# Patient Record
Sex: Male | Born: 1955 | Race: Black or African American | Hispanic: No | Marital: Single | Smoking: Former smoker
Health system: Southern US, Community
[De-identification: ages and names within clinical notes are randomized; demographics above are authoritative.]

## PROBLEM LIST (undated history)

## (undated) DIAGNOSIS — K2100 Gastro-esophageal reflux disease with esophagitis, without bleeding: Secondary | ICD-10-CM

## (undated) DIAGNOSIS — R7401 Elevation of levels of liver transaminase levels: Secondary | ICD-10-CM

## (undated) DIAGNOSIS — Z9221 Personal history of antineoplastic chemotherapy: Secondary | ICD-10-CM

## (undated) DIAGNOSIS — R740 Nonspecific elevation of levels of transaminase and lactic acid dehydrogenase [LDH]: Secondary | ICD-10-CM

## (undated) DIAGNOSIS — D126 Benign neoplasm of colon, unspecified: Secondary | ICD-10-CM

## (undated) DIAGNOSIS — K5790 Diverticulosis of intestine, part unspecified, without perforation or abscess without bleeding: Secondary | ICD-10-CM

## (undated) DIAGNOSIS — Z720 Tobacco use: Secondary | ICD-10-CM

## (undated) DIAGNOSIS — Z923 Personal history of irradiation: Secondary | ICD-10-CM

## (undated) DIAGNOSIS — M549 Dorsalgia, unspecified: Secondary | ICD-10-CM

## (undated) DIAGNOSIS — T7840XA Allergy, unspecified, initial encounter: Secondary | ICD-10-CM

## (undated) DIAGNOSIS — N419 Inflammatory disease of prostate, unspecified: Secondary | ICD-10-CM

## (undated) DIAGNOSIS — M21611 Bunion of right foot: Secondary | ICD-10-CM

## (undated) DIAGNOSIS — M1990 Unspecified osteoarthritis, unspecified site: Secondary | ICD-10-CM

## (undated) DIAGNOSIS — K649 Unspecified hemorrhoids: Secondary | ICD-10-CM

## (undated) DIAGNOSIS — M7712 Lateral epicondylitis, left elbow: Secondary | ICD-10-CM

## (undated) DIAGNOSIS — C7931 Secondary malignant neoplasm of brain: Secondary | ICD-10-CM

## (undated) DIAGNOSIS — N529 Male erectile dysfunction, unspecified: Secondary | ICD-10-CM

## (undated) HISTORY — DX: Gastro-esophageal reflux disease with esophagitis, without bleeding: K21.00

## (undated) HISTORY — DX: Gastro-esophageal reflux disease with esophagitis: K21.0

## (undated) HISTORY — DX: Benign neoplasm of colon, unspecified: D12.6

## (undated) HISTORY — DX: Unspecified osteoarthritis, unspecified site: M19.90

## (undated) HISTORY — DX: Male erectile dysfunction, unspecified: N52.9

## (undated) HISTORY — DX: Diverticulosis of intestine, part unspecified, without perforation or abscess without bleeding: K57.90

## (undated) HISTORY — PX: COLONOSCOPY W/ POLYPECTOMY: SHX1380

## (undated) HISTORY — PX: TONSILLECTOMY: SUR1361

## (undated) HISTORY — DX: Nonspecific elevation of levels of transaminase and lactic acid dehydrogenase (ldh): R74.0

## (undated) HISTORY — DX: Elevation of levels of liver transaminase levels: R74.01

## (undated) HISTORY — DX: Lateral epicondylitis, left elbow: M77.12

## (undated) HISTORY — DX: Tobacco use: Z72.0

## (undated) HISTORY — DX: Unspecified hemorrhoids: K64.9

## (undated) HISTORY — DX: Dorsalgia, unspecified: M54.9

## (undated) HISTORY — PX: APPENDECTOMY: SHX54

## (undated) HISTORY — PX: OTHER SURGICAL HISTORY: SHX169

## (undated) HISTORY — PX: KNEE SURGERY: SHX244

## (undated) HISTORY — DX: Inflammatory disease of prostate, unspecified: N41.9

## (undated) HISTORY — DX: Bunion of right foot: M21.611

## (undated) HISTORY — DX: Allergy, unspecified, initial encounter: T78.40XA

---

## 2005-04-23 ENCOUNTER — Emergency Department (HOSPITAL_COMMUNITY): Admission: EM | Admit: 2005-04-23 | Discharge: 2005-04-23 | Payer: Self-pay | Admitting: Emergency Medicine

## 2007-01-09 ENCOUNTER — Emergency Department (HOSPITAL_COMMUNITY): Admission: EM | Admit: 2007-01-09 | Discharge: 2007-01-09 | Payer: Self-pay | Admitting: Emergency Medicine

## 2008-03-24 ENCOUNTER — Encounter (INDEPENDENT_AMBULATORY_CARE_PROVIDER_SITE_OTHER): Payer: Self-pay | Admitting: Surgery

## 2008-03-24 ENCOUNTER — Ambulatory Visit (HOSPITAL_BASED_OUTPATIENT_CLINIC_OR_DEPARTMENT_OTHER): Admission: RE | Admit: 2008-03-24 | Discharge: 2008-03-24 | Payer: Self-pay | Admitting: Surgery

## 2010-05-17 NOTE — Op Note (Signed)
NAME:  Devin Gonzalez, Devin Gonzalez NO.:  [id number]   MEDICAL RECORD NO.:  [id number]          PATIENT TYPE:  AMB   LOCATION:  DSC                          FACILITY:  [facility]   PHYSICIAN:  Currie Paris, M.D.DATE OF BIRTH:  September 25, 1955   DATE OF PROCEDURE:  03/24/2008  DATE OF DISCHARGE:                               OPERATIVE REPORT   PREOPERATIVE DIAGNOSIS:  Lipoma, right forearm.   POSTOPERATIVE DIAGNOSIS:  Lipoma, right forearm.   OPERATION:  Removal of lipoma.   SURGEON:  Currie Paris, MD   ANESTHESIA:  Local.   CLINICAL HISTORY:  This is a 55 year old gentleman with a painful soft  mass of the upper right forearm just below the antecubital fossa.  Because of symptoms, he wished to have this removed.  Clinically, it  appeared to be a lipoma.   DESCRIPTION OF PROCEDURE:  The patient was seen in the minor procedure  room and we confirmed the procedure and location.   The area was then prepped with some alcohol and anesthetized with 1%  Xylocaine with epi using a 10 mL of total volume.  I waited 10 minutes  for good effect of the epinephrine and the area was then prepped with  Betadine.  I made a short transverse incision over this and entered the  capsule of the lipoma.  Using a combination of sharp and blunt  dissection, I was able to free most of it up, but posteriorly it was  that actually entwined with 2 superficial veins and it was dissected off  of those carefully.   Once it was completely out, I checked to make sure there was no  residual.  Incision was then closed in layers with 3-0 Vicryl and 4-0  Monocryl subcuticular plus Dermabond and pressure dressing was applied.  Incision was 3 cm in length.      Currie Paris, M.D.  Electronically Signed     CJS/MEDQ  D:  03/24/2008  T:  03/25/2008  Job:  [id number]   cc:   Devin Gonzalez, M.D.

## 2010-09-11 ENCOUNTER — Emergency Department (HOSPITAL_COMMUNITY): Payer: 59

## 2010-09-11 ENCOUNTER — Emergency Department (HOSPITAL_COMMUNITY)
Admission: EM | Admit: 2010-09-11 | Discharge: 2010-09-12 | Disposition: A | Discharge status: Home / Self Care | Payer: 59 | Attending: Emergency Medicine | Admitting: Emergency Medicine

## 2010-09-11 DIAGNOSIS — F411 Generalized anxiety disorder: Secondary | ICD-10-CM | POA: Insufficient documentation

## 2010-09-11 DIAGNOSIS — R079 Chest pain, unspecified: Secondary | ICD-10-CM | POA: Insufficient documentation

## 2010-09-11 DIAGNOSIS — M940 Chondrocostal junction syndrome [Tietze]: Secondary | ICD-10-CM | POA: Insufficient documentation

## 2011-01-03 DIAGNOSIS — K5790 Diverticulosis of intestine, part unspecified, without perforation or abscess without bleeding: Secondary | ICD-10-CM

## 2011-01-03 HISTORY — PX: ANAL FISSURE REPAIR: SHX2312

## 2011-01-03 HISTORY — DX: Diverticulosis of intestine, part unspecified, without perforation or abscess without bleeding: K57.90

## 2011-06-19 ENCOUNTER — Encounter: Payer: Self-pay | Admitting: Internal Medicine

## 2011-06-27 ENCOUNTER — Ambulatory Visit (AMBULATORY_SURGERY_CENTER): Payer: 59

## 2011-06-27 VITALS — Ht 64.0 in | Wt 123.1 lb

## 2011-06-27 DIAGNOSIS — K649 Unspecified hemorrhoids: Secondary | ICD-10-CM

## 2011-06-27 DIAGNOSIS — Z1211 Encounter for screening for malignant neoplasm of colon: Secondary | ICD-10-CM

## 2011-06-27 DIAGNOSIS — K625 Hemorrhage of anus and rectum: Secondary | ICD-10-CM

## 2011-06-27 DIAGNOSIS — K648 Other hemorrhoids: Secondary | ICD-10-CM

## 2011-06-27 MED ORDER — MOVIPREP 100 G PO SOLR: ORAL | Status: DC

## 2011-06-30 ENCOUNTER — Ambulatory Visit (AMBULATORY_SURGERY_CENTER): Payer: 59 | Admitting: Internal Medicine

## 2011-06-30 ENCOUNTER — Encounter: Payer: Self-pay | Admitting: Internal Medicine

## 2011-06-30 VITALS — BP 150/91 | HR 87 | Temp 96.8°F | Resp 16 | Ht 64.0 in | Wt 123.0 lb

## 2011-06-30 DIAGNOSIS — D126 Benign neoplasm of colon, unspecified: Secondary | ICD-10-CM

## 2011-06-30 DIAGNOSIS — Z1211 Encounter for screening for malignant neoplasm of colon: Secondary | ICD-10-CM

## 2011-06-30 DIAGNOSIS — K648 Other hemorrhoids: Secondary | ICD-10-CM

## 2011-06-30 MED ORDER — HYDROCORTISONE ACETATE 25 MG RE SUPP: 25.0000 mg | Freq: Every day | RECTAL | Status: DC

## 2011-06-30 MED ORDER — SODIUM CHLORIDE 0.9 % IV SOLN: 500.0000 mL | INTRAVENOUS | Status: DC

## 2011-06-30 NOTE — Progress Notes (Signed)
Patient did not experience any of the following events: a burn prior to discharge; a fall within the facility; wrong site/side/patient/procedure/implant event; or a hospital transfer or hospital admission upon discharge from the facility. [phone]) Patient did not have preoperative order for IV antibiotic SSI prophylaxis. [phone])   Pt. Requested rebate coupon for movi prep. movi prep coupon for 20.00 given to pt. Prior to discharge.

## 2011-06-30 NOTE — Patient Instructions (Addendum)
Two small polyps were removed. I also saw diverticulosis and hemorrhoids. Please read the handouts provided. I have prescribed medicated suppositories (Anusol HC) for the hemorrhoids. Insert one nightly for the next 7 nights and then use as needed. My office will get an appointment with a surgeon regarding hemorrhoids. Iva Boop, MD, FACG   YOU HAD AN ENDOSCOPIC PROCEDURE TODAY AT THE [facility]: Refer to the procedure report that was given to you for any specific questions about what was found during the examination.  If the procedure report does not answer your questions, please call your gastroenterologist to clarify.  If you requested that your care partner not be given the details of your procedure findings, then the procedure report has been included in a sealed envelope for you to review at your convenience later.  YOU SHOULD EXPECT: Some feelings of bloating in the abdomen. Passage of more gas than usual.  Walking can help get rid of the air that was put into your GI tract during the procedure and reduce the bloating. If you had a lower endoscopy (such as a colonoscopy or flexible sigmoidoscopy) you may notice spotting of blood in your stool or on the toilet paper. If you underwent a bowel prep for your procedure, then you may not have a normal bowel movement for a few days.  DIET: Your first meal following the procedure should be a light meal and then it is ok to progress to your normal diet.  A half-sandwich or bowl of soup is an example of a good first meal.  Heavy or fried foods are harder to digest and may make you feel nauseous or bloated.  Likewise meals heavy in dairy and vegetables can cause extra gas to form and this can also increase the bloating.  Drink plenty of fluids but you should avoid alcoholic beverages for 24 hours.  ACTIVITY: Your care partner should take you home directly after the procedure.  You should plan to take it easy, moving slowly for the rest  of the day.  You can resume normal activity the day after the procedure however you should NOT DRIVE or use heavy machinery for 24 hours (because of the sedation medicines used during the test).    SYMPTOMS TO REPORT IMMEDIATELY: A gastroenterologist can be reached at any hour.  During normal business hours, 8:30 AM to 5:00 PM Monday through Friday, call [phone].  After hours and on weekends, please call the GI answering service at [phone] who will take a message and have the physician on call contact you.   Following lower endoscopy (colonoscopy or flexible sigmoidoscopy):  Excessive amounts of blood in the stool  Significant tenderness or worsening of abdominal pains  Swelling of the abdomen that is new, acute  Fever of 100F or higher   FOLLOW UP: If any biopsies were taken you will be contacted by phone or by letter within the next 1-3 weeks.  Call your gastroenterologist if you have not heard about the biopsies in 3 weeks.  Our staff will call the home number listed on your records the next business day following your procedure to check on you and address any questions or concerns that you may have at that time regarding the information given to you following your procedure. This is a courtesy call and so if there is no answer at the home number and we have not heard from you through the emergency physician on call, we will assume that you have  returned to your regular daily activities without incident.  SIGNATURES/CONFIDENTIALITY: You and/or your care partner have signed paperwork which will be entered into your electronic medical record.  These signatures attest to the fact that that the information above on your After Visit Summary has been reviewed and is understood.  Full responsibility of the confidentiality of this discharge information lies with you and/or your care-partner.   Resume medications. Informations given on polyps,diverticulosis,hemorrhoids and high fiber  diet.

## 2011-06-30 NOTE — Op Note (Addendum)
[facility] [address] [place], [place]  [address]  COLONOSCOPY PROCEDURE REPORT  PATIENT:  Devin Gonzalez, Devin Gonzalez  MR#:  [id number] BIRTHDATE:  09-May-1955, 55 yrs. old  GENDER:  male ENDOSCOPIST:  Iva Boop, MD, [facility] REF. BY:  Guerry Bruin, M.D. PROCEDURE DATE:  06/30/2011 PROCEDURE:  Colonoscopy with biopsy and snare polypectomy ASA CLASS:  Class III INDICATIONS:  Routine Risk Screening MEDICATIONS:   MAC sedation, administered by CRNA, propofol (Diprivan) 200 mg IV  DESCRIPTION OF PROCEDURE:   After the risks benefits and alternatives of the procedure were thoroughly explained, informed consent was obtained.  Digital rectal exam was performed and revealed no abnormalities and normal prostate.   The LB CF-H180AL [id number] endoscope was introduced through the anus and advanced to the cecum, which was identified by both the appendix and ileocecal valve, without limitations.  The quality of the prep was excellent, using MoviPrep.  The instrument was then slowly withdrawn as the colon was fully examined. <<PROCEDUREIMAGES>>  FINDINGS:  A diminutive (5mm) polyp was found in the sigmoid colon. Polyp was snared without cautery. Retrieval was successful. snare polyp  There was a possible polyp in the in the cecum. 2-3 mm. The polyp was removed using cold biopsy forceps.  Severe diverticulosis was found throughout the colon. Severe in left colon, mild in right.  This was otherwise a normal examination of the colon. Includes right colon retroflexion.   Retroflexed views in the rectum revealed internal hemorrhoids.    The time to cecum = 3:09 minutes. The scope was then withdrawn in 9:33 minutes from the cecum and the procedure completed. COMPLICATIONS:  None ENDOSCOPIC IMPRESSION: 1) Diminutive polyp in the sigmoid colon-removed (5mm) 2) Polyp, possible in the cecum (removed (2-59mm) 3) Severe diverticulosis in left colon, mild in right 4) Internal hemorrhoids 5)  Otherwise normal examination, excellent prep RECOMMENDATIONS: 1) Anusol HC suppository prescription for hemorrhoids 2) My office will arrange a surgery appouintment to evaluate and treat symptomatic hemorrhoids REPEAT EXAM:  In for Colonoscopy, pending biopsy results.  Iva Boop, MD, Clementeen Graham  CC:  Guerry Bruin, MD and The Patient  n. REVISED:  06/30/2011 09:37 AM eSIGNED:   Iva Boop at 06/30/2011 09:37 AM  Albertine Grates, [phone]

## 2011-07-03 ENCOUNTER — Telehealth: Payer: Self-pay | Admitting: *Deleted

## 2011-07-03 ENCOUNTER — Other Ambulatory Visit: Payer: Self-pay

## 2011-07-03 DIAGNOSIS — K648 Other hemorrhoids: Secondary | ICD-10-CM

## 2011-07-03 NOTE — Telephone Encounter (Signed)
Left message

## 2011-07-03 NOTE — Progress Notes (Signed)
Patient advised of appt date and time for 08/01/11 10:10.  He says this is not a good time for his schedule.  I have given him the phone number to call and reschedule.

## 2011-07-05 ENCOUNTER — Encounter: Payer: Self-pay | Admitting: Internal Medicine

## 2011-07-05 DIAGNOSIS — Z860100 Personal history of colon polyps, unspecified: Secondary | ICD-10-CM | POA: Insufficient documentation

## 2011-07-05 NOTE — Progress Notes (Signed)
Quick Note:  Diminutive adenoma x 1 routine repeat colon about 2018 ______

## 2011-08-01 ENCOUNTER — Encounter (INDEPENDENT_AMBULATORY_CARE_PROVIDER_SITE_OTHER): Payer: Self-pay | Admitting: Surgery

## 2011-08-10 ENCOUNTER — Ambulatory Visit (INDEPENDENT_AMBULATORY_CARE_PROVIDER_SITE_OTHER): Payer: Commercial Managed Care - PPO | Admitting: Surgery

## 2011-08-10 ENCOUNTER — Encounter (INDEPENDENT_AMBULATORY_CARE_PROVIDER_SITE_OTHER): Payer: Self-pay | Admitting: Surgery

## 2011-08-10 VITALS — BP 136/84 | HR 74 | Temp 97.8°F | Resp 14 | Ht 63.5 in | Wt 120.2 lb

## 2011-08-10 DIAGNOSIS — K602 Anal fissure, unspecified: Secondary | ICD-10-CM

## 2011-08-10 DIAGNOSIS — K649 Unspecified hemorrhoids: Secondary | ICD-10-CM

## 2011-08-10 NOTE — Progress Notes (Signed)
Patient ID: Devin Gonzalez, male   DOB: 04/26/1955, 55 y.o.   MRN: [id number]  Chief Complaint  Patient presents with  . Hemorrhoids    HPI Devin Gonzalez is a 55 y.o. male.  Patient's at the request of Dr. Gessner after colonoscopy revealed internal hemorrhoid disease. He has a history of rectal bleeding, rectal burning, rectal discomfort, severe pain after defecation and difficulty having a bowel movement due to pain for 1 year. Colonoscopy for which showed some very tiny polyps which were benign but showed some internal hemorrhoids. His pain is severe and is not getting better with medical management. The pain is after bowel movements but can be between bowel movements. The patient is anal canal the pain is sharp severe. Denies severe constipation. HPI  Past Medical History  Diagnosis Date  . Allergy   . Hemorrhoids   . Left tennis elbow   . Arthritis     hands  . Back pain     right side  . Bunion, right foot     Past Surgical History  Procedure Date  . Cyst on testicle     right  . Appendectomy   . Tonsillectomy   . Knee surgery     arthroscopic surg /  bil knees    History reviewed. No pertinent family history.  Social History History  Substance Use Topics  . Smoking status: Current Everyday Smoker -- 0.5 packs/day for 20 years    Types: Cigarettes  . Smokeless tobacco: Never Used  . Alcohol Use: 14.0 oz/week    28 drink(s) per week    No Known Allergies  Current Outpatient Prescriptions  Medication Sig Dispense Refill  . hydrocortisone (ANUSOL-HC) 25 MG suppository Place 1 suppository (25 mg total) rectally at bedtime. Do nightly for seven days and then as needed  12 suppository  0  . hydrocortisone cream 0.5 % Apply topically every other day.        Review of Systems Review of Systems  Constitutional: Negative for fever, chills and unexpected weight change.  HENT: Negative for hearing loss, congestion, sore throat, trouble swallowing and voice  change.   Eyes: Negative for visual disturbance.  Respiratory: Negative for cough and wheezing.   Cardiovascular: Negative for chest pain, palpitations and leg swelling.  Gastrointestinal: Positive for anal bleeding and rectal pain. Negative for nausea, vomiting, abdominal pain, diarrhea, constipation, blood in stool and abdominal distention.  Genitourinary: Negative for hematuria and difficulty urinating.  Musculoskeletal: Negative for arthralgias.  Skin: Negative for rash and wound.  Neurological: Negative for seizures, syncope, weakness and headaches.  Hematological: Negative for adenopathy. Does not bruise/bleed easily.  Psychiatric/Behavioral: Negative for confusion.    Blood pressure 136/84, pulse 74, temperature 97.8 F (36.6 C), temperature source Temporal, resp. rate 14, height 5' 3.5" (1.613 m), weight 120 lb 4 oz (54.545 kg).  Physical Exam Physical Exam  Constitutional: He is oriented to person, place, and time. He appears well-developed and well-nourished.  HENT:  Head: Normocephalic and atraumatic.  Eyes: EOM are normal. Pupils are equal, round, and reactive to light.  Neck: Normal range of motion. Neck supple.  Cardiovascular: Normal rate and regular rhythm.   Pulmonary/Chest: Effort normal and breath sounds normal.  Abdominal: Soft. Bowel sounds are normal.  Genitourinary:       Posterior midline anal fissure noted. Hypertrophied internal sphincter was significant spasm during digital examination and discomfort. Grade 2 internal hemorrhoid disease involving all 3 columns. No mass.  Musculoskeletal: Normal range of   motion.  Neurological: He is alert and oriented to person, place, and time.  Skin: Skin is warm and dry.  Psychiatric: He has a normal mood and affect. His behavior is normal. Judgment and thought content normal.    Data Reviewed Colonoscopy  Small polyps with three column internal hemorrhoids.  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 He also has signs of chronic anal fissure in the posterior midline. Recommend hemorrhoidectomy as well. Risk of bleeding, infection, fecal incontinence, change in sensation of bowel movements, the need for other procedures, possible organ and/or nerve and blood vessel injury, DVT, cardiovascular complications, and death were discussed. He is tried maximal medical treatment has failed. He wishes to proceed with surgery. Medical alternatives discussed. Recommended smoking cessation.       Devin Gonzalez A. 08/10/2011, 2:27 PM    

## 2011-08-10 NOTE — Patient Instructions (Signed)
Anal Fissure, Adult An anal fissure is a small tear or crack in the skin around the anus. Bleeding from a fissure usually stops on its own within a few minutes. However, bleeding will often reoccur with each bowel movement until the crack heals.  CAUSES   Passing large, hard stools.   Frequent diarrheal stools.   Constipation.   Inflammatory bowel disease (Crohn's disease or ulcerative colitis).   Infections.   Anal sex.  SYMPTOMS   Small amounts of blood seen on your stools, on toilet paper, or in the toilet after a bowel movement.   Rectal bleeding.   Painful bowel movements.   Itching or irritation around the anus.  DIAGNOSIS Your caregiver will examine the anal area. An anal fissure can usually be seen with careful inspection. A rectal exam may be performed and a short tube (anoscope) may be used to examine the anal canal. TREATMENT   You may be instructed to take fiber supplements. These supplements can soften your stool to help make bowel movements easier.   Sitz baths may be recommended to help heal the tear. Do not use soap in the sitz baths.   A medicated cream or ointment may be prescribed to lessen discomfort.  HOME CARE INSTRUCTIONS   Maintain a diet high in fruits, whole grains, and vegetables. Avoid constipating foods like bananas and dairy products.   Take sitz baths as directed by your caregiver.   Drink enough fluids to keep your urine clear or pale yellow.   Only take over-the-counter or prescription medicines for pain, discomfort, or fever as directed by your caregiver. Do not take aspirin as this may increase bleeding.   Do not use ointments containing numbing medications (anesthetics) or hydrocortisone. They could slow healing.  SEEK MEDICAL CARE IF:   Your fissure is not completely healed within 3 days.   You have further bleeding.   You have a fever.   You have diarrhea mixed with blood.   You have pain.   Your problem is getting worse  rather than better.  MAKE SURE YOU:   Understand these instructions.   Will watch your condition.   Will get help right away if you are not doing well or get worse.  Document Released: 12/19/2004 Document Revised: 12/08/2010 Document Reviewed: 06/05/2010 [facility] Patient Information 2012 [place], [place].      Hemorrhoidectomy Hemorrhoidectomy is surgery to remove hemorrhoids. Hemorrhoids are veins that have become swollen in the rectum. The rectum is the area from the bottom end of the intestines to the opening where bowel movements leave the body. Hemorrhoids can be uncomfortable. They can cause itching, bleeding and pain if a blood clot forms in them (thrombose). If hemorrhoids are small, surgery may not be needed. But if they cover a larger area, surgery is usually suggested.  LET YOUR CAREGIVER KNOW ABOUT:   Any allergies.   All medications you are taking, including:   Herbs, eyedrops, over-the-counter medications and creams.   Blood thinners (anticoagulants), aspirin or other drugs that could affect blood clotting.   Use of steroids (by mouth or as creams).   Previous problems with anesthetics, including local anesthetics.   Possibility of pregnancy, if this applies.   Any history of blood clots.   Any history of bleeding or other blood problems.   Previous surgery.   Smoking history.   Other health problems.  RISKS AND COMPLICATIONS All surgery carries some risk. However, hemorrhoid surgery usually goes smoothly. Possible complications could include:  Urinary  retention.   Bleeding.   Infection.   A painful incision.   A reaction to the anesthesia (this is not common).  BEFORE THE PROCEDURE   Stop using aspirin and non-steroidal anti-inflammatory drugs (NSAIDs) for pain relief. This includes prescription drugs and over-the-counter drugs such as ibuprofen and naproxen. Also stop taking vitamin E. If possible, do this two weeks before your surgery.   If  you take blood-thinners, ask your healthcare provider when you should stop taking them.   You will probably have blood and urine tests done several days before your surgery.   Do not eat or drink for about 8 hours before the surgery.   Arrive at least an hour before the surgery, or whenever your surgeon recommends. This will give you time to check in and fill out any needed paperwork.   Hemorrhoidectomy is often an outpatient procedure. This means you will be able to go home the same day. Sometimes, though, people stay overnight in the hospital after the procedure. Ask your surgeon what to expect. Either way, make arrangements in advance for someone to drive you home.  PROCEDURE   The preparation:   You will change into a hospital gown.   You will be given an IV. A needle will be inserted in your arm. Medication can flow directly into your body through this needle.   You might be given an enema to clear your rectum.   Once in the operating room, you will probably lie on your side or be repositioned later to lying on your stomach.   You will be given anesthesia (medication) so you will not feel anything during the surgery. The surgery often is done with local anesthesia (the area near the hemorrhoids will be numb and you will be drowsy but awake). Sometimes, general anesthesia is used (you will be asleep during the procedure).   The procedure:   There are a few different procedures for hemorrhoids. Be sure to ask you surgeon about the procedure, the risks and benefits.   Be sure to ask about what you need to do to take care of the wound, if there is one.  AFTER THE PROCEDURE  You will stay in a recovery area until the anesthesia has worn off. Your blood pressure and pulse will be checked every so often.   You may feel a lot of pain in the area of the rectum.   Take all pain medication prescribed by your surgeon. Ask before taking any over-the-counter pain medicines.   Sometimes  sitting in a warm bath can help relieve your pain.   To make sure you have bowel movements without straining:   You will probably need to take stool softeners (usually a pill) for a few days.   You should drink 8 to 10 glasses of water each day.   Your activity will be restricted for awhile. Ask your caregiver for a list of what you should and should not do while you recover.  Document Released: 10/16/2008 Document Revised: 12/08/2010 Document Reviewed: 10/16/2008 [facility] Patient Information 2012 [place], [place].

## 2011-08-14 ENCOUNTER — Encounter (HOSPITAL_COMMUNITY): Payer: Self-pay

## 2011-08-22 NOTE — Pre-Procedure Instructions (Signed)
20 RAKESH DUTKO  08/22/2011   Your procedure is scheduled on:  Monday, August 26th  Report to [facility] at 0530 AM.  Call this number if you have problems the morning of surgery: [phone]   Remember:   Do not eat food or drink:After Midnight.  Take these medicines the morning of surgery with A SIP OF WATER: nasonex, eye drops   Do not wear jewelry, make-up or nail polish.  Do not wear lotions, powders, or perfumes. .  Do not shave 48 hours prior to surgery. Men may shave face and neck.  Do not bring valuables to the hospital.  Contacts, dentures or bridgework may not be worn into surgery.  Leave suitcase in the car. After surgery it may be brought to your room.  For patients admitted to the hospital, checkout time is 11:00 AM the day of discharge.   Patients discharged the day of surgery will not be allowed to drive home.   Special Instructions: CHG Shower Use Special Wash: 1/2 bottle night before surgery and 1/2 bottle morning of surgery.   Please read over the following fact sheets that you were given: Pain Booklet, Coughing and Deep Breathing, MRSA Information and Surgical Site Infection Prevention

## 2011-08-23 ENCOUNTER — Encounter (HOSPITAL_COMMUNITY): Payer: Self-pay

## 2011-08-23 ENCOUNTER — Encounter (HOSPITAL_COMMUNITY)
Admission: RE | Admit: 2011-08-23 | Discharge: 2011-08-23 | Disposition: A | Discharge status: Home / Self Care | Payer: 59 | Source: Ambulatory Visit | Attending: Surgery | Admitting: Surgery

## 2011-08-23 LAB — COMPREHENSIVE METABOLIC PANEL
BUN: 10 mg/dL (ref 6–23)
CO2: 25 mEq/L (ref 19–32)
Calcium: 9.8 mg/dL (ref 8.4–10.5)
Chloride: 106 mEq/L (ref 96–112)
Creatinine, Ser: 0.92 mg/dL (ref 0.50–1.35)
GFR calc non Af Amer: >90 mL/min (ref >90)
Total Bilirubin: 0.4 mg/dL (ref 0.3–1.2)

## 2011-08-23 LAB — CBC WITH DIFFERENTIAL/PLATELET
Basophils Relative: 0 % (ref 0–1)
Eosinophils Relative: 2 % (ref 0–5)
HCT: 38 % — ABNORMAL LOW (ref 39.0–52.0)
Hemoglobin: 13.6 g/dL (ref 13.0–17.0)
Lymphocytes Relative: 45 % (ref 12–46)
MCHC: 35.8 g/dL (ref 30.0–36.0)
MCV: 86.6 fL (ref 78.0–100.0)
Monocytes Absolute: 0.8 10*3/uL (ref 0.1–1.0)
Monocytes Relative: 10 % (ref 3–12)
Neutro Abs: 3.4 10*3/uL (ref 1.7–7.7)

## 2011-08-23 LAB — SURGICAL PCR SCREEN: Staphylococcus aureus: NEGATIVE

## 2011-08-23 NOTE — Progress Notes (Signed)
Primary Physician - Dr. Wylene Simmer Does not have a cardiologist. Has not had a cardiac work up

## 2011-08-27 MED ORDER — FLEET ENEMA 7-19 GM/118ML RE ENEM: 1.0000 | ENEMA | Freq: Once | RECTAL | Status: DC

## 2011-08-27 MED ORDER — DEXTROSE 5 % IV SOLN
3.0000 g | INTRAVENOUS | Status: AC
  Administered 2011-08-28: 3 g via INTRAVENOUS
  Filled 2011-08-27: qty 3000

## 2011-08-28 ENCOUNTER — Encounter (HOSPITAL_COMMUNITY)
Admission: RE | Disposition: A | Discharge status: Home / Self Care | Payer: Self-pay | Source: Ambulatory Visit | Attending: Surgery

## 2011-08-28 ENCOUNTER — Ambulatory Visit (HOSPITAL_COMMUNITY): Payer: 59 | Admitting: Anesthesiology

## 2011-08-28 ENCOUNTER — Encounter (HOSPITAL_COMMUNITY): Payer: Self-pay | Admitting: *Deleted

## 2011-08-28 ENCOUNTER — Ambulatory Visit (HOSPITAL_COMMUNITY)
Admission: RE | Admit: 2011-08-28 | Discharge: 2011-08-28 | Disposition: A | Discharge status: Home / Self Care | Payer: 59 | Source: Ambulatory Visit | Attending: Surgery | Admitting: Surgery

## 2011-08-28 ENCOUNTER — Encounter (HOSPITAL_COMMUNITY): Payer: Self-pay | Admitting: Anesthesiology

## 2011-08-28 ENCOUNTER — Telehealth (INDEPENDENT_AMBULATORY_CARE_PROVIDER_SITE_OTHER): Payer: Self-pay | Admitting: General Surgery

## 2011-08-28 DIAGNOSIS — F17200 Nicotine dependence, unspecified, uncomplicated: Secondary | ICD-10-CM | POA: Insufficient documentation

## 2011-08-28 DIAGNOSIS — Z86010 Personal history of colonic polyps: Secondary | ICD-10-CM

## 2011-08-28 DIAGNOSIS — K648 Other hemorrhoids: Secondary | ICD-10-CM

## 2011-08-28 DIAGNOSIS — K644 Residual hemorrhoidal skin tags: Secondary | ICD-10-CM | POA: Insufficient documentation

## 2011-08-28 DIAGNOSIS — K602 Anal fissure, unspecified: Secondary | ICD-10-CM | POA: Insufficient documentation

## 2011-08-28 HISTORY — PX: HEMORRHOID SURGERY: SHX153

## 2011-08-28 HISTORY — PX: SPHINCTEROTOMY: SHX5279

## 2011-08-28 SURGERY — SPHINCTEROTOMY, ANAL
Anesthesia: General | Wound class: Clean Contaminated

## 2011-08-28 MED ORDER — LIDOCAINE HCL 2 % EX GEL: CUTANEOUS | Status: DC | PRN

## 2011-08-28 MED ORDER — DIBUCAINE 1 % RE OINT
TOPICAL_OINTMENT | RECTAL | Status: AC
  Filled 2011-08-28: qty 28

## 2011-08-28 MED ORDER — LACTATED RINGERS IV SOLN
INTRAVENOUS | Status: DC | PRN
  Administered 2011-08-28 (×2): via INTRAVENOUS

## 2011-08-28 MED ORDER — MIDAZOLAM HCL 5 MG/5ML IJ SOLN
INTRAMUSCULAR | Status: DC | PRN
  Administered 2011-08-28: 2 mg via INTRAVENOUS

## 2011-08-28 MED ORDER — OXYCODONE-ACETAMINOPHEN 5-325 MG PO TABS: 2.0000 | ORAL_TABLET | ORAL | Status: AC | PRN

## 2011-08-28 MED ORDER — 0.9 % SODIUM CHLORIDE (POUR BTL) OPTIME
TOPICAL | Status: DC | PRN
  Administered 2011-08-28: 1000 mL

## 2011-08-28 MED ORDER — FENTANYL CITRATE 0.05 MG/ML IJ SOLN
INTRAMUSCULAR | Status: DC | PRN
  Administered 2011-08-28: 25 ug via INTRAVENOUS
  Administered 2011-08-28: 50 ug via INTRAVENOUS
  Administered 2011-08-28: 100 ug via INTRAVENOUS

## 2011-08-28 MED ORDER — ONDANSETRON HCL 4 MG/2ML IJ SOLN
INTRAMUSCULAR | Status: DC | PRN
  Administered 2011-08-28: 4 mg via INTRAVENOUS

## 2011-08-28 MED ORDER — PHENYLEPHRINE HCL 10 MG/ML IJ SOLN
INTRAMUSCULAR | Status: DC | PRN
  Administered 2011-08-28 (×5): 80 ug via INTRAVENOUS

## 2011-08-28 MED ORDER — LIDOCAINE HCL 2 % EX GEL
CUTANEOUS | Status: AC
  Filled 2011-08-28: qty 20

## 2011-08-28 MED ORDER — OXYCODONE-ACETAMINOPHEN 5-325 MG PO TABS
ORAL_TABLET | ORAL | Status: AC
  Filled 2011-08-28: qty 1

## 2011-08-28 MED ORDER — BUPIVACAINE HCL (PF) 0.25 % IJ SOLN
INTRAMUSCULAR | Status: AC
  Filled 2011-08-28: qty 30

## 2011-08-28 MED ORDER — BUPIVACAINE LIPOSOME 1.3 % IJ SUSP
INTRAMUSCULAR | Status: DC | PRN
  Administered 2011-08-28: 266 mg

## 2011-08-28 MED ORDER — TRAMADOL HCL 50 MG PO TABS: 50.0000 mg | ORAL_TABLET | Freq: Four times a day (QID) | ORAL | Status: AC | PRN

## 2011-08-28 MED ORDER — HYDROMORPHONE HCL PF 1 MG/ML IJ SOLN: 0.2500 mg | INTRAMUSCULAR | Status: DC | PRN

## 2011-08-28 MED ORDER — LIDOCAINE HCL 2 % EX GEL
CUTANEOUS | Status: DC | PRN
  Administered 2011-08-28: 1 via TOPICAL

## 2011-08-28 MED ORDER — PROPOFOL 10 MG/ML IV EMUL
INTRAVENOUS | Status: DC | PRN
  Administered 2011-08-28: 200 mg via INTRAVENOUS

## 2011-08-28 MED ORDER — OXYCODONE-ACETAMINOPHEN 5-325 MG PO TABS
1.0000 | ORAL_TABLET | ORAL | Status: DC | PRN
  Administered 2011-08-28 (×2): 1 via ORAL

## 2011-08-28 MED ORDER — LIDOCAINE HCL 1 % IJ SOLN
INTRAMUSCULAR | Status: DC | PRN
  Administered 2011-08-28: 80 mg via INTRADERMAL

## 2011-08-28 MED ORDER — ONDANSETRON HCL 4 MG/2ML IJ SOLN: 4.0000 mg | Freq: Once | INTRAMUSCULAR | Status: DC | PRN

## 2011-08-28 MED ORDER — POLYETHYLENE GLYCOL 3350 17 GM/SCOOP PO POWD: 17.0000 g | Freq: Every day | ORAL | Status: AC

## 2011-08-28 MED ORDER — HEMOSTATIC AGENTS (NO CHARGE) OPTIME
TOPICAL | Status: DC | PRN
  Administered 2011-08-28: 1 via TOPICAL

## 2011-08-28 MED ORDER — BUPIVACAINE LIPOSOME 1.3 % IJ SUSP
20.0000 mL | INTRAMUSCULAR | Status: DC
  Filled 2011-08-28: qty 20

## 2011-08-28 MED ORDER — SURGILUBE EX GEL
CUTANEOUS | Status: DC | PRN
  Administered 2011-08-28: 1 via TOPICAL

## 2011-08-28 SURGICAL SUPPLY — 42 items
BLADE SURG 15 STRL LF DISP TIS (BLADE) ×1 IMPLANT
BLADE SURG 15 STRL SS (BLADE) ×2
BRIEF STRETCH FOR OB PAD LRG (UNDERPADS AND DIAPERS) ×1 IMPLANT
CANISTER SUCTION 2500CC (MISCELLANEOUS) ×2 IMPLANT
CLOTH BEACON ORANGE TIMEOUT ST (SAFETY) ×2 IMPLANT
COVER SURGICAL LIGHT HANDLE (MISCELLANEOUS) ×2 IMPLANT
DECANTER SPIKE VIAL GLASS SM (MISCELLANEOUS) ×2 IMPLANT
DRAPE PROXIMA HALF (DRAPES) ×2 IMPLANT
DRAPE UTILITY 15X26 W/TAPE STR (DRAPE) ×4 IMPLANT
DRSG PAD ABDOMINAL 8X10 ST (GAUZE/BANDAGES/DRESSINGS) ×2 IMPLANT
ELECT CAUTERY BLADE 6.4 (BLADE) ×2 IMPLANT
ELECT REM PT RETURN 9FT ADLT (ELECTROSURGICAL) ×2
ELECTRODE REM PT RTRN 9FT ADLT (ELECTROSURGICAL) ×1 IMPLANT
GAUZE SPONGE 4X4 16PLY XRAY LF (GAUZE/BANDAGES/DRESSINGS) ×2 IMPLANT
GLOVE BIO SURGEON STRL SZ8 (GLOVE) ×2 IMPLANT
GLOVE BIOGEL PI IND STRL 8 (GLOVE) ×1 IMPLANT
GLOVE BIOGEL PI INDICATOR 8 (GLOVE) ×1
GOWN STRL NON-REIN LRG LVL3 (GOWN DISPOSABLE) ×4 IMPLANT
HEMOSTAT SURGICEL 2X14 (HEMOSTASIS) ×1 IMPLANT
KIT BASIN OR (CUSTOM PROCEDURE TRAY) ×2 IMPLANT
KIT ROOM TURNOVER OR (KITS) ×2 IMPLANT
NDL HYPO 25GX1X1/2 BEV (NEEDLE) ×1 IMPLANT
NEEDLE HYPO 25GX1X1/2 BEV (NEEDLE) ×2 IMPLANT
NS IRRIG 1000ML POUR BTL (IV SOLUTION) ×2 IMPLANT
PACK LITHOTOMY IV (CUSTOM PROCEDURE TRAY) ×2 IMPLANT
PAD ARMBOARD 7.5X6 YLW CONV (MISCELLANEOUS) ×4 IMPLANT
PENCIL BUTTON HOLSTER BLD 10FT (ELECTRODE) ×2 IMPLANT
SPECIMEN JAR SMALL (MISCELLANEOUS) ×2 IMPLANT
SPONGE GAUZE 4X4 12PLY (GAUZE/BANDAGES/DRESSINGS) ×2 IMPLANT
SPONGE SURGIFOAM ABS GEL 12-7 (HEMOSTASIS) ×2 IMPLANT
SURGILUBE 2OZ TUBE FLIPTOP (MISCELLANEOUS) ×2 IMPLANT
SUT CHROMIC 2 0 SH (SUTURE) IMPLANT
SUT MON AB 3-0 SH 27 (SUTURE) ×2
SUT MON AB 3-0 SH27 (SUTURE) ×1 IMPLANT
SYR CONTROL 10ML LL (SYRINGE) ×2 IMPLANT
TOWEL OR 17X24 6PK STRL BLUE (TOWEL DISPOSABLE) ×2 IMPLANT
TOWEL OR 17X26 10 PK STRL BLUE (TOWEL DISPOSABLE) ×2 IMPLANT
TRAY PROCTOSCOPIC FIBER OPTIC (SET/KITS/TRAYS/PACK) IMPLANT
TUBE CONNECTING 12X1/4 (SUCTIONS) ×2 IMPLANT
UNDERPAD 30X30 INCONTINENT (UNDERPADS AND DIAPERS) ×2 IMPLANT
WATER STERILE IRR 1000ML POUR (IV SOLUTION) IMPLANT
YANKAUER SUCT BULB TIP NO VENT (SUCTIONS) ×3 IMPLANT

## 2011-08-28 NOTE — Preoperative (Signed)
Beta Blockers   Reason not to administer Beta Blockers:Not Applicable 

## 2011-08-28 NOTE — Op Note (Signed)
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  Anesthesia: LMA with Exparel 20 cc diluted with 20 cc saline  EBL: Minimal  Specimen: Hemorrhoid tissue to pathology  Drains: None  IV fluids: 800 cc crystalloid  Indications for procedure: The patient presents with chronic anal pain and swollen hemorrhoid tissue  That is not responded to medical management. Anal fissure has not healed he has significant post bowel movements pain, bleeding and swelling. Medical and surgical options were discussed and and he failed medical management. Hemorrhoid technique discussed to include open, closed, staple, banding and injection. Pros and cons of each were discussed as well as postop expectations he was to proceed with hemorrhoidectomy and lateral internal sphincterotomy. Risk of bleeding, infection, narrowing of anal canal, incontinence, organ injury, the need for further surgery failure of surgery discussed. He agreed to proceed.  Description of procedure: Patient was met in the holding area and questions were answered. He is taken back to the operating room and placed supine where general anesthesia was initiated. He was placed lithotomy. Anal canal was prepped and draped in sterile fashion. Timeout was done. Procedure was verified. Digital examination done. He had a thickened internal anal sphincter. He had a chronic posterior midline anal fissure. Anoscope was placed. The right lateral position was chosen sphincterotomy. #11 blade was introduced intersphincteric groove with the sharp edge facing the internal sphincter. The internal sphincter was divided with  guidance from my finger. This released well. External sphincter preserved. He had 2 columns of internal and external hemorrhoid disease involving the left lateral and right posterior. These were removed with the harmonic scalpel and over sewn with 3-0 Monocryl. The bottom was left open to drain. The right anterior column look normal hemostasis achieved the anal canal was irrigated.Exparel used as anal block. Gelfoam wrapped around Surgicel was used as plug. Hemostasis excellent. All counts found to be correct. He was taken lithotomy extubated taken recovery in satisfactory condition.

## 2011-08-28 NOTE — Interval H&P Note (Signed)
History and Physical Interval Note:  08/28/2011 7:25 AM  Devin Gonzalez  has presented today for surgery, with the diagnosis of anal fissure/hemorrhoids  The various methods of treatment have been discussed with the patient and family. After consideration of risks, benefits and other options for treatment, the patient has consented to  Procedure(s) (LRB): SPHINCTEROTOMY (N/A) HEMORRHOIDECTOMY (N/A) as a surgical intervention .  The patient's history has been reviewed, patient examined, no change in status, stable for surgery.  I have reviewed the patient's chart and labs.  Questions were answered to the patient's satisfaction.     Tracie Lindbloom A.

## 2011-08-28 NOTE — Progress Notes (Signed)
Called [facility] to have on call Dr.,  Dr. Lindie Spruce paged, since no response from Dr.Cornett.Marland Kitchen

## 2011-08-28 NOTE — Anesthesia Postprocedure Evaluation (Signed)
  Anesthesia Post-op Note  Patient: Devin Gonzalez  Procedure(s) Performed: Procedure(s) (LRB): SPHINCTEROTOMY (N/A) HEMORRHOIDECTOMY (N/A)  Patient Location: PACU  Anesthesia Type: General  Level of Consciousness: awake, alert  and oriented  Airway and Oxygen Therapy: Patient Spontanous Breathing and Patient connected to nasal cannula oxygen  Post-op Pain: none  Post-op Assessment: Post-op Vital signs reviewed  Post-op Vital Signs: Reviewed  Complications: No apparent anesthesia complications

## 2011-08-28 NOTE — Transfer of Care (Signed)
Immediate Anesthesia Transfer of Care Note  Patient: Devin Gonzalez  Procedure(s) Performed: Procedure(s) (LRB): SPHINCTEROTOMY (N/A) HEMORRHOIDECTOMY (N/A)  Patient Location: PACU  Anesthesia Type: General  Level of Consciousness: awake, alert , oriented and patient cooperative  Airway & Oxygen Therapy: Patient Spontanous Breathing and Patient connected to nasal cannula oxygen  Post-op Assessment: Report given to PACU RN and Post -op Vital signs reviewed and stable  Post vital signs: Reviewed and stable  Complications: No apparent anesthesia complications

## 2011-08-28 NOTE — Progress Notes (Signed)
Dr Luisa Hart advised that pt did not get Fleets Last pm nor this am ... Dr C states "that will be  Fine"

## 2011-08-28 NOTE — H&P (View-Only) (Signed)
Patient ID: Devin Gonzalez, male   DOB: 1955/06/26, 56 y.o.   MRN: [id number]  Chief Complaint  Patient presents with  . Hemorrhoids    HPI Devin Gonzalez is a 56 y.o. male.  Patient's at the request of Dr. Leone Payor after colonoscopy revealed internal hemorrhoid disease. He has a history of rectal bleeding, rectal burning, rectal discomfort, severe pain after defecation and difficulty having a bowel movement due to pain for 1 year. Colonoscopy for which showed some very tiny polyps which were benign but showed some internal hemorrhoids. His pain is severe and is not getting better with medical management. The pain is after bowel movements but can be between bowel movements. The patient is anal canal the pain is sharp severe. Denies severe constipation. HPI  Past Medical History  Diagnosis Date  . Allergy   . Hemorrhoids   . Left tennis elbow   . Arthritis     hands  . Back pain     right side  . Bunion, right foot     Past Surgical History  Procedure Date  . Cyst on testicle     right  . Appendectomy   . Tonsillectomy   . Knee surgery     arthroscopic surg /  bil knees    History reviewed. No pertinent family history.  Social History History  Substance Use Topics  . Smoking status: Current Everyday Smoker -- 0.5 packs/day for 20 years    Types: Cigarettes  . Smokeless tobacco: Never Used  . Alcohol Use: 14.0 oz/week    28 drink(s) per week    No Known Allergies  Current Outpatient Prescriptions  Medication Sig Dispense Refill  . hydrocortisone (ANUSOL-HC) 25 MG suppository Place 1 suppository (25 mg total) rectally at bedtime. Do nightly for seven days and then as needed  12 suppository  0  . hydrocortisone cream 0.5 % Apply topically every other day.        Review of Systems Review of Systems  Constitutional: Negative for fever, chills and unexpected weight change.  HENT: Negative for hearing loss, congestion, sore throat, trouble swallowing and voice  change.   Eyes: Negative for visual disturbance.  Respiratory: Negative for cough and wheezing.   Cardiovascular: Negative for chest pain, palpitations and leg swelling.  Gastrointestinal: Positive for anal bleeding and rectal pain. Negative for nausea, vomiting, abdominal pain, diarrhea, constipation, blood in stool and abdominal distention.  Genitourinary: Negative for hematuria and difficulty urinating.  Musculoskeletal: Negative for arthralgias.  Skin: Negative for rash and wound.  Neurological: Negative for seizures, syncope, weakness and headaches.  Hematological: Negative for adenopathy. Does not bruise/bleed easily.  Psychiatric/Behavioral: Negative for confusion.    Blood pressure 136/84, pulse 74, temperature 97.8 F (36.6 C), temperature source Temporal, resp. rate 14, height 5' 3.5" (1.613 m), weight 120 lb 4 oz (54.545 kg).  Physical Exam Physical Exam  Constitutional: He is oriented to person, place, and time. He appears well-developed and well-nourished.  HENT:  Head: Normocephalic and atraumatic.  Eyes: EOM are normal. Pupils are equal, round, and reactive to light.  Neck: Normal range of motion. Neck supple.  Cardiovascular: Normal rate and regular rhythm.   Pulmonary/Chest: Effort normal and breath sounds normal.  Abdominal: Soft. Bowel sounds are normal.  Genitourinary:       Posterior midline anal fissure noted. Hypertrophied internal sphincter was significant spasm during digital examination and discomfort. Grade 2 internal hemorrhoid disease involving all 3 columns. No mass.  Musculoskeletal: Normal range of  motion.  Neurological: He is alert and oriented to person, place, and time.  Skin: Skin is warm and dry.  Psychiatric: He has a normal mood and affect. His behavior is normal. Judgment and thought content normal.    Data Reviewed Colonoscopy  Small polyps with three column internal hemorrhoids.  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 He also has signs of chronic anal fissure in the posterior midline. Recommend hemorrhoidectomy as well. Risk of bleeding, infection, fecal incontinence, change in sensation of bowel movements, the need for other procedures, possible organ and/or nerve and blood vessel injury, DVT, cardiovascular complications, and death were discussed. He is tried maximal medical treatment has failed. He wishes to proceed with surgery. Medical alternatives discussed. Recommended smoking cessation.       Key Cen A. 08/10/2011, 2:27 PM

## 2011-08-28 NOTE — Telephone Encounter (Signed)
Patient called in stated that he feels itchy all over and still a little sleepy from the surgery. Advised that since he already uses benadryl to continue with that and maybe try the ointment/cream version as well. Patient agreed.

## 2011-08-28 NOTE — Progress Notes (Signed)
Pt  Did not  Have pre op Fleets... Dr Luisa Hart paged.

## 2011-08-28 NOTE — Anesthesia Preprocedure Evaluation (Addendum)
Anesthesia Evaluation  Patient identified by MRN, date of birth, ID band Patient awake    Reviewed: Allergy & Precautions, H&P , NPO status , Patient's Chart, lab work & pertinent test results  History of Anesthesia Complications Negative for: history of anesthetic complications  Airway Mallampati: II TM Distance: >3 FB Neck ROM: Full    Dental  (+) Partial Upper, Teeth Intact and Dental Advisory Given   Pulmonary Current Smoker,  breath sounds clear to auscultation        Cardiovascular negative cardio ROS  Rhythm:Regular Rate:Normal     Neuro/Psych negative neurological ROS  negative psych ROS   GI/Hepatic negative GI ROS, (+)     substance abuse  alcohol use,   Endo/Other  negative endocrine ROS  Renal/GU negative Renal ROS  negative genitourinary   Musculoskeletal negative musculoskeletal ROS (+)   Abdominal   Peds  Hematology negative hematology ROS (+)   Anesthesia Other Findings   Reproductive/Obstetrics                         Anesthesia Physical Anesthesia Plan  ASA: II  Anesthesia Plan: General   Post-op Pain Management:    Induction: Intravenous  Airway Management Planned: LMA  Additional Equipment:   Intra-op Plan:   Post-operative Plan: Extubation in OR  Informed Consent: I have reviewed the patients History and Physical, chart, labs and discussed the procedure including the risks, benefits and alternatives for the proposed anesthesia with the patient or authorized representative who has indicated his/her understanding and acceptance.   Dental advisory given  Plan Discussed with: CRNA, Anesthesiologist and Surgeon  Anesthesia Plan Comments:         Anesthesia Quick Evaluation

## 2011-08-29 ENCOUNTER — Encounter (HOSPITAL_COMMUNITY): Payer: Self-pay | Admitting: Surgery

## 2011-09-11 ENCOUNTER — Telehealth (INDEPENDENT_AMBULATORY_CARE_PROVIDER_SITE_OTHER): Payer: Self-pay | Admitting: General Surgery

## 2011-09-11 NOTE — Telephone Encounter (Signed)
Pt calling with two concerns: (1) He is itching unbearably and (2.) Tramadol is not helping with pain.  Paged Dr. Luisa Hart and updated.  Ordered Benadryl 25 mg Q6H prn itching and Hydrocodone 5/325 mg, # 30 1-2 po Q 4-6 H prn pain, no refill---called to [facility] [phone].  Pt is aware.

## 2011-09-19 ENCOUNTER — Ambulatory Visit (INDEPENDENT_AMBULATORY_CARE_PROVIDER_SITE_OTHER): Payer: Commercial Managed Care - PPO | Admitting: Surgery

## 2011-09-19 ENCOUNTER — Encounter (INDEPENDENT_AMBULATORY_CARE_PROVIDER_SITE_OTHER): Payer: Self-pay | Admitting: Surgery

## 2011-09-19 VITALS — BP 124/84 | HR 84 | Temp 97.8°F | Resp 14 | Ht 60.0 in | Wt 123.6 lb

## 2011-09-19 DIAGNOSIS — Z9889 Other specified postprocedural states: Secondary | ICD-10-CM

## 2011-09-19 MED ORDER — POLYETHYLENE GLYCOL 3350 17 GM/SCOOP PO POWD: 17.0000 g | Freq: Every day | ORAL | Status: DC

## 2011-09-19 MED ORDER — OXYCODONE-ACETAMINOPHEN 5-325 MG PO TABS: 1.0000 | ORAL_TABLET | Freq: Four times a day (QID) | ORAL | Status: DC | PRN

## 2011-09-19 NOTE — Progress Notes (Signed)
Patient returns after hemorrhoidectomy and lateral internal sphincterotomy. He still having pain but doing somewhat better. He is moving his bowels. His discharge is less. Denies any fever or chills.  Exam: Wound channel canal healing well. Minimal drainage and bleeding. No signs of abscess or infection.  Impression status post hemorrhoidectomy and lateral internal sphincterotomy  Plan: Will be due more weeks to recover. Refill pain medicine and MiraLax. Continued to soak return in 2-3 weeks.

## 2011-09-19 NOTE — Patient Instructions (Signed)
Return 2 weeks.  Continue to soak and take miralax.  Pain meds refilled.

## 2011-09-26 ENCOUNTER — Telehealth (INDEPENDENT_AMBULATORY_CARE_PROVIDER_SITE_OTHER): Payer: Self-pay | Admitting: General Surgery

## 2011-09-26 NOTE — Telephone Encounter (Signed)
Pt called for "some kind of cream Dr. Luisa Hart was going to order for me."  Per Dr Luisa Hart, called in Lidocaine 2% jelly, 15 gram tube, apply to anus BID prn pain , 3 refills to [facility]:  [phone].  Pt is aware.

## 2011-10-09 ENCOUNTER — Ambulatory Visit (INDEPENDENT_AMBULATORY_CARE_PROVIDER_SITE_OTHER): Payer: Commercial Managed Care - PPO | Admitting: Surgery

## 2011-10-09 ENCOUNTER — Encounter (INDEPENDENT_AMBULATORY_CARE_PROVIDER_SITE_OTHER): Payer: Self-pay | Admitting: Surgery

## 2011-10-09 VITALS — BP 142/68 | HR 92 | Temp 98.6°F | Resp 16 | Ht 64.0 in | Wt 126.4 lb

## 2011-10-09 DIAGNOSIS — Z9889 Other specified postprocedural states: Secondary | ICD-10-CM

## 2011-10-09 MED ORDER — OXYCODONE-ACETAMINOPHEN 5-325 MG PO TABS: 1.0000 | ORAL_TABLET | ORAL | Status: DC | PRN

## 2011-10-09 NOTE — Patient Instructions (Signed)
Apply diltiazem gel every 6 hours to anal canal.return 3 weeks

## 2011-10-09 NOTE — Progress Notes (Signed)
Patient returns after lateral internal sphincterotomy and hemorrhoidectomy for anal fissure and internal hemorrhoid disease. He 6 weeks out making slow progress. He is a little bit he was before but still has pain.  Exam: Tobi Bastos canal shows wounds that are clean but not yet fully healed. No signs of infection.  Impression: Status post lateral internal sphincterotomy and hemorrhoidectomy  Plan: He is making slow progress this may take another month to fully resolve. Straining pushing and pulling do make it worse. I will try diltiazem gel to see if it helps of this pain. He will be out of work another month and possibly longer depending on how he does. Return to clinic 3 weeks for recheck. Continue Percocet and diltiazem for discomfort and pain.

## 2011-10-31 ENCOUNTER — Encounter (INDEPENDENT_AMBULATORY_CARE_PROVIDER_SITE_OTHER): Payer: Commercial Managed Care - PPO | Admitting: Surgery

## 2011-11-06 ENCOUNTER — Ambulatory Visit (INDEPENDENT_AMBULATORY_CARE_PROVIDER_SITE_OTHER): Payer: Commercial Managed Care - PPO | Admitting: Surgery

## 2011-11-06 ENCOUNTER — Encounter (INDEPENDENT_AMBULATORY_CARE_PROVIDER_SITE_OTHER): Payer: Self-pay | Admitting: Surgery

## 2011-11-06 VITALS — BP 148/84 | HR 88 | Temp 99.0°F | Resp 18 | Ht 64.0 in | Wt 126.0 lb

## 2011-11-06 DIAGNOSIS — G8918 Other acute postprocedural pain: Secondary | ICD-10-CM

## 2011-11-06 MED ORDER — OXYCODONE-ACETAMINOPHEN 5-325 MG PO TABS: 1.0000 | ORAL_TABLET | ORAL | Status: DC | PRN

## 2011-11-06 MED ORDER — LIDOCAINE HCL 2 % EX GEL: CUTANEOUS | Status: DC | PRN

## 2011-11-06 NOTE — Progress Notes (Signed)
Patient returns in followup after hemorrhoidectomy and lateral internal sphincterotomy. He continues to have significant perianal pain requiring medication. He does have some drainage it sounds like after bowel movements bloody and extra tissue he notices after bowel movements. Pain is quite severe it makes difficult for him to sit. He does have some intermittent incontinence to gas and liquid stool.  Exam: Anal canal well healed. Rectal exam done. No evidence of anal stenosis. Significant spasm of internal sphincter muscle. No mass. No abscess  Impression: Significant postoperative pain 2 months out from lateral internal sphincterotomy and hemorrhoidectomy  Plan: Discussed options of exam under anesthesia versus continue medical management for now. He'll but he continued medical management but is no better the next 2-3 weeks I believe exam under anesthesia will be necessary with possible revision of lateral internal sphincterotomy. Discussed incontinence issues with him given the procedure. Continued medical management return in 3 weeks.

## 2011-11-06 NOTE — Patient Instructions (Signed)
Return 3 weeks/

## 2011-11-08 ENCOUNTER — Encounter (INDEPENDENT_AMBULATORY_CARE_PROVIDER_SITE_OTHER): Payer: Self-pay

## 2011-12-05 ENCOUNTER — Encounter (INDEPENDENT_AMBULATORY_CARE_PROVIDER_SITE_OTHER): Payer: Self-pay | Admitting: Surgery

## 2011-12-05 ENCOUNTER — Ambulatory Visit (INDEPENDENT_AMBULATORY_CARE_PROVIDER_SITE_OTHER): Payer: Commercial Managed Care - PPO | Admitting: Surgery

## 2011-12-05 VITALS — BP 136/80 | HR 86 | Temp 97.4°F | Resp 18 | Ht 67.0 in | Wt 131.6 lb

## 2011-12-05 DIAGNOSIS — K6289 Other specified diseases of anus and rectum: Secondary | ICD-10-CM

## 2011-12-05 NOTE — Patient Instructions (Signed)
You will be schedule for EUA

## 2011-12-05 NOTE — Progress Notes (Signed)
Patient ID: Devin Gonzalez, male   DOB: [birth year], 56 y.o.   MRN: [id number]  Chief Complaint  Patient presents with  . Follow-up    internal hems    HPI Devin Gonzalez is a 56 y.o. male.  Patient returns in followup for anal pain. He is no better on medical management. He continues to have severe anal pain when he sits on his buttocks. Medications are not helping. HPI  Past Medical History  Diagnosis Date  . Allergy   . Hemorrhoids   . Back pain     right side  . Bunion, right foot   . Left tennis elbow   . Arthritis     hands, knees, right hip    Past Surgical History  Procedure Date  . Cyst on testicle     right  . Appendectomy   . Tonsillectomy   . Knee surgery     arthroscopic surg /  bil knees  . Fatty tumor     removal right forearm  . Colonoscopy w/ polypectomy   . Sphincterotomy 08/28/2011    Procedure: SPHINCTEROTOMY;  Surgeon: Clovis Pu. Earlie Schank, MD;  Location: [facility] OR;  Service: General;  Laterality: N/A;  Lateral internal sphincterotomy  . Hemorrhoid surgery 08/28/2011    Procedure: HEMORRHOIDECTOMY;  Surgeon: Clovis Pu. Tyreshia Ingman, MD;  Location: [facility] OR;  Service: General;  Laterality: N/A;  ,possible hemorrhoidectomy    No family history on file.  Social History History  Substance Use Topics  . Smoking status: Current Every Day Smoker -- 0.5 packs/day for 20 years    Types: Cigarettes  . Smokeless tobacco: Never Used  . Alcohol Use: 14.0 oz/week    28 drink(s) per week    No Known Allergies  Current Outpatient Prescriptions  Medication Sig Dispense Refill  . Ascorbic Acid (VITAMIN C PO) Take 1 tablet by mouth daily.      . DiphenhydrAMINE HCl (BENADRYL ALLERGY PO) Take 1 tablet by mouth at bedtime.      . lidocaine (XYLOCAINE JELLY) 2 % jelly Apply topically as needed.  30 mL  0  . mometasone (NASONEX) 50 MCG/ACT nasal spray Place 2 sprays into the nose daily.      . montelukast (SINGULAIR) 10 MG tablet Take 10 mg by mouth at bedtime.      Marland Kitchen  oxyCODONE-acetaminophen (ROXICET) 5-325 MG per tablet Take 1 tablet by mouth every 4 (four) hours as needed for pain.  30 tablet  0  . polyethylene glycol powder (GLYCOLAX) powder Take 17 g by mouth daily.  255 g  0  . Tetrahydrozoline HCl (VISINE OP) Place 2-4 drops into both eyes 2 (two) times daily as needed. For dry eyes      . vitamin E 400 UNIT capsule Take 400 Units by mouth daily.      Marland Kitchen witch hazel-glycerin (TUCKS) pad Place 1 application rectally as needed. For hemmoroids        Review of Systems Review of Systems  Constitutional: Negative.   Gastrointestinal: Positive for anal bleeding.    Blood pressure 136/80, pulse 86, temperature 97.4 F (36.3 C), temperature source Temporal, resp. rate 18, height 5\' 7"  (1.702 m), weight 131 lb 9.6 oz (59.693 kg).  Physical Exam Physical Exam  Constitutional: He appears well-developed and well-nourished.  HENT:  Head: Normocephalic and atraumatic.  Eyes: EOM are normal. Pupils are equal, round, and reactive to light.  Genitourinary:         Assessment    Severe  anal pain 3 months after lateral internal sphincterotomy and hemorrhoidectomy    Plan    Pt is no better.  Recommend EUA to better evaluate pain in anal canal. The procedure has been discussed with the patient.  Alternative therapies have been discussed with the patient.  Operative risks include bleeding,  Infection,  Organ injury,  Nerve injury,  Blood vessel injury,  DVT,  Pulmonary embolism,  Death,  And possible reoperation.  Fecal incontinence is another possible risk.   Medical management risks include worsening of present situation.  The success of the procedure is 50 -90 % at treating patients symptoms.  The patient understands and agrees to proceed.       Andersson Larrabee A. 12/05/2011, 3:39 PM

## 2011-12-11 ENCOUNTER — Encounter (HOSPITAL_COMMUNITY): Payer: Self-pay | Admitting: Pharmacy Technician

## 2011-12-12 ENCOUNTER — Encounter (HOSPITAL_COMMUNITY): Payer: Self-pay

## 2011-12-12 MED ORDER — CEFAZOLIN SODIUM-DEXTROSE 2-3 GM-% IV SOLR
2.0000 g | INTRAVENOUS | Status: AC
  Administered 2011-12-13: 2 g via INTRAVENOUS
  Filled 2011-12-12: qty 50

## 2011-12-13 ENCOUNTER — Encounter (HOSPITAL_COMMUNITY)
Admission: RE | Disposition: A | Discharge status: Home / Self Care | Payer: Self-pay | Source: Ambulatory Visit | Attending: Surgery

## 2011-12-13 ENCOUNTER — Ambulatory Visit (HOSPITAL_COMMUNITY): Payer: 59 | Admitting: Anesthesiology

## 2011-12-13 ENCOUNTER — Encounter (HOSPITAL_COMMUNITY): Payer: Self-pay | Admitting: Anesthesiology

## 2011-12-13 ENCOUNTER — Ambulatory Visit (HOSPITAL_COMMUNITY)
Admission: RE | Admit: 2011-12-13 | Discharge: 2011-12-13 | Disposition: A | Discharge status: Home / Self Care | Payer: 59 | Source: Ambulatory Visit | Attending: Surgery | Admitting: Surgery

## 2011-12-13 ENCOUNTER — Encounter (HOSPITAL_COMMUNITY): Payer: Self-pay | Admitting: *Deleted

## 2011-12-13 DIAGNOSIS — K6289 Other specified diseases of anus and rectum: Secondary | ICD-10-CM

## 2011-12-13 DIAGNOSIS — G8918 Other acute postprocedural pain: Secondary | ICD-10-CM

## 2011-12-13 DIAGNOSIS — Z86010 Personal history of colonic polyps: Secondary | ICD-10-CM

## 2011-12-13 DIAGNOSIS — K602 Anal fissure, unspecified: Secondary | ICD-10-CM | POA: Insufficient documentation

## 2011-12-13 DIAGNOSIS — K648 Other hemorrhoids: Secondary | ICD-10-CM

## 2011-12-13 HISTORY — PX: EXAMINATION UNDER ANESTHESIA: SHX1540

## 2011-12-13 LAB — CBC
HCT: 42.2 % (ref 39.0–52.0)
Hemoglobin: 14.8 g/dL (ref 13.0–17.0)
MCH: 30.7 pg (ref 26.0–34.0)
MCHC: 35.1 g/dL (ref 30.0–36.0)
RBC: 4.82 MIL/uL (ref 4.22–5.81)

## 2011-12-13 LAB — COMPREHENSIVE METABOLIC PANEL
ALT: 64 U/L — ABNORMAL HIGH (ref 0–53)
AST: 57 U/L — ABNORMAL HIGH (ref 0–37)
CO2: 24 mEq/L (ref 19–32)
Calcium: 9.5 mg/dL (ref 8.4–10.5)
Sodium: 141 mEq/L (ref 135–145)
Total Protein: 7.6 g/dL (ref 6.0–8.3)

## 2011-12-13 SURGERY — EXAM UNDER ANESTHESIA
Anesthesia: General | Site: Anus | Wound class: Clean Contaminated

## 2011-12-13 MED ORDER — LACTATED RINGERS IV SOLN
INTRAVENOUS | Status: DC
  Administered 2011-12-13: 10:00:00 via INTRAVENOUS

## 2011-12-13 MED ORDER — OXYCODONE-ACETAMINOPHEN 5-325 MG PO TABS: 1.0000 | ORAL_TABLET | ORAL | Status: DC | PRN

## 2011-12-13 MED ORDER — LACTATED RINGERS IV SOLN
INTRAVENOUS | Status: DC | PRN
  Administered 2011-12-13: 11:00:00 via INTRAVENOUS

## 2011-12-13 MED ORDER — SODIUM CHLORIDE 0.9 % IJ SOLN
INTRAMUSCULAR | Status: DC | PRN
  Administered 2011-12-13: 20 mL via INTRAVENOUS

## 2011-12-13 MED ORDER — LIDOCAINE HCL (CARDIAC) 20 MG/ML IV SOLN
INTRAVENOUS | Status: DC | PRN
  Administered 2011-12-13: 100 mg via INTRAVENOUS

## 2011-12-13 MED ORDER — HEMOSTATIC AGENTS (NO CHARGE) OPTIME
TOPICAL | Status: DC | PRN
  Administered 2011-12-13 (×2): 1 via TOPICAL

## 2011-12-13 MED ORDER — BUPIVACAINE LIPOSOME 1.3 % IJ SUSP
INTRAMUSCULAR | Status: DC | PRN
  Administered 2011-12-13: 20 mL

## 2011-12-13 MED ORDER — PHENYLEPHRINE HCL 10 MG/ML IJ SOLN
INTRAMUSCULAR | Status: DC | PRN
  Administered 2011-12-13 (×2): 80 ug via INTRAVENOUS

## 2011-12-13 MED ORDER — MIDAZOLAM HCL 5 MG/5ML IJ SOLN
INTRAMUSCULAR | Status: DC | PRN
  Administered 2011-12-13: 2 mg via INTRAVENOUS

## 2011-12-13 MED ORDER — HYDROMORPHONE HCL PF 1 MG/ML IJ SOLN: 0.2500 mg | INTRAMUSCULAR | Status: DC | PRN

## 2011-12-13 MED ORDER — FENTANYL CITRATE 0.05 MG/ML IJ SOLN
INTRAMUSCULAR | Status: DC | PRN
  Administered 2011-12-13 (×2): 25 ug via INTRAVENOUS

## 2011-12-13 MED ORDER — PROPOFOL 10 MG/ML IV BOLUS
INTRAVENOUS | Status: DC | PRN
  Administered 2011-12-13: 200 mg via INTRAVENOUS

## 2011-12-13 MED ORDER — POLYETHYLENE GLYCOL 3350 17 GM/SCOOP PO POWD: 17.0000 g | Freq: Every day | ORAL | Status: DC

## 2011-12-13 MED ORDER — CHLORHEXIDINE GLUCONATE 4 % EX LIQD: 1.0000 "application " | Freq: Once | CUTANEOUS | Status: DC

## 2011-12-13 MED ORDER — SODIUM CHLORIDE 0.9 % IV SOLN: INTRAVENOUS | Status: DC | PRN

## 2011-12-13 MED ORDER — BUPIVACAINE LIPOSOME 1.3 % IJ SUSP
20.0000 mL | Freq: Once | INTRAMUSCULAR | Status: DC
  Filled 2011-12-13: qty 20

## 2011-12-13 MED ORDER — ONDANSETRON HCL 4 MG/2ML IJ SOLN
INTRAMUSCULAR | Status: DC | PRN
  Administered 2011-12-13: 4 mg via INTRAVENOUS

## 2011-12-13 MED ORDER — 0.9 % SODIUM CHLORIDE (POUR BTL) OPTIME
TOPICAL | Status: DC | PRN
  Administered 2011-12-13: 1000 mL

## 2011-12-13 SURGICAL SUPPLY — 40 items
BLADE SURG 15 STRL LF DISP TIS (BLADE) ×1 IMPLANT
BLADE SURG 15 STRL SS (BLADE) ×2
CANISTER SUCTION 2500CC (MISCELLANEOUS) ×2 IMPLANT
CLOTH BEACON ORANGE TIMEOUT ST (SAFETY) ×2 IMPLANT
COVER SURGICAL LIGHT HANDLE (MISCELLANEOUS) ×2 IMPLANT
DECANTER SPIKE VIAL GLASS SM (MISCELLANEOUS) ×2 IMPLANT
DRAPE PROXIMA HALF (DRAPES) ×2 IMPLANT
DRAPE UTILITY 15X26 W/TAPE STR (DRAPE) IMPLANT
DRSG PAD ABDOMINAL 8X10 ST (GAUZE/BANDAGES/DRESSINGS) ×2 IMPLANT
ELECT CAUTERY BLADE 6.4 (BLADE) ×2 IMPLANT
ELECT REM PT RETURN 9FT ADLT (ELECTROSURGICAL) ×2
ELECTRODE REM PT RTRN 9FT ADLT (ELECTROSURGICAL) ×1 IMPLANT
GAUZE SPONGE 4X4 16PLY XRAY LF (GAUZE/BANDAGES/DRESSINGS) ×2 IMPLANT
GLOVE BIO SURGEON STRL SZ8 (GLOVE) ×2 IMPLANT
GLOVE BIOGEL PI IND STRL 8 (GLOVE) ×1 IMPLANT
GLOVE BIOGEL PI INDICATOR 8 (GLOVE) ×1
GOWN STRL NON-REIN LRG LVL3 (GOWN DISPOSABLE) ×4 IMPLANT
HEMOSTAT SURGICEL 2X14 (HEMOSTASIS) ×1 IMPLANT
KIT BASIN OR (CUSTOM PROCEDURE TRAY) ×2 IMPLANT
KIT ROOM TURNOVER OR (KITS) ×2 IMPLANT
NDL HYPO 25GX1X1/2 BEV (NEEDLE) ×1 IMPLANT
NEEDLE HYPO 25GX1X1/2 BEV (NEEDLE) ×2 IMPLANT
NS IRRIG 1000ML POUR BTL (IV SOLUTION) ×2 IMPLANT
PACK LITHOTOMY IV (CUSTOM PROCEDURE TRAY) ×2 IMPLANT
PAD ARMBOARD 7.5X6 YLW CONV (MISCELLANEOUS) ×2 IMPLANT
PENCIL BUTTON HOLSTER BLD 10FT (ELECTRODE) ×2 IMPLANT
SPECIMEN JAR SMALL (MISCELLANEOUS) IMPLANT
SPONGE GAUZE 4X4 12PLY (GAUZE/BANDAGES/DRESSINGS) ×2 IMPLANT
SPONGE SURGIFOAM ABS GEL 100 (HEMOSTASIS) ×1 IMPLANT
SURGILUBE 2OZ TUBE FLIPTOP (MISCELLANEOUS) ×2 IMPLANT
SUT CHROMIC 2 0 SH (SUTURE) IMPLANT
SUT MON AB 3-0 SH 27 (SUTURE)
SUT MON AB 3-0 SH27 (SUTURE) IMPLANT
SYR CONTROL 10ML LL (SYRINGE) ×2 IMPLANT
TOWEL OR 17X24 6PK STRL BLUE (TOWEL DISPOSABLE) ×2 IMPLANT
TOWEL OR 17X26 10 PK STRL BLUE (TOWEL DISPOSABLE) ×2 IMPLANT
TRAY PROCTOSCOPIC FIBER OPTIC (SET/KITS/TRAYS/PACK) IMPLANT
TUBE CONNECTING 12X1/4 (SUCTIONS) ×2 IMPLANT
UNDERPAD 30X30 INCONTINENT (UNDERPADS AND DIAPERS) ×2 IMPLANT
YANKAUER SUCT BULB TIP NO VENT (SUCTIONS) ×2 IMPLANT

## 2011-12-13 NOTE — Brief Op Note (Signed)
12/13/2011  11:23 AM  PATIENT:  Devin Gonzalez  56 y.o. male  PRE-OPERATIVE DIAGNOSIS:  anal pain  POST-OPERATIVE DIAGNOSIS:  anal pain  PROCEDURE:  Procedure(s) (LRB) with comments: EXAM UNDER ANESTHESIA (N/A) Lateral internal sphincterotomy  SURGEON:  Surgeon(s) and Role:    * Rainer Mounce A. Cole Klugh, MD - Primary  PHYSICIAN ASSISTANT:   ASSISTANTS: none   ANESTHESIA:   local and general  EBL:  Total I/O In: 700 [I.V.:700] Out: 20 [Blood:20]  BLOOD ADMINISTERED:none  DRAINS: none   LOCAL MEDICATIONS USED:  OTHER Exparel 40 cc  SPECIMEN:  No Specimen  DISPOSITION OF SPECIMEN:  N/A  COUNTS:  YES  TOURNIQUET:  * No tourniquets in log *  DICTATION: .Other Dictation: Dictation Number [id number]  PLAN OF CARE: Discharge to home after PACU  PATIENT DISPOSITION:  PACU - hemodynamically stable.   Delay start of Pharmacological VTE agent (>24hrs) due to surgical blood loss or risk of bleeding: no

## 2011-12-13 NOTE — Transfer of Care (Signed)
Immediate Anesthesia Transfer of Care Note  Patient: Devin Gonzalez  Procedure(s) Performed: Procedure(s) (LRB) with comments: EXAM UNDER ANESTHESIA (N/A)  Patient Location: PACU  Anesthesia Type:General  Level of Consciousness: awake, alert , oriented and patient cooperative  Airway & Oxygen Therapy: Patient Spontanous Breathing and Patient connected to nasal cannula oxygen  Post-op Assessment: Report given to PACU RN, Post -op Vital signs reviewed and stable and Patient moving all extremities  Post vital signs: Reviewed and stable  Complications: No apparent anesthesia complications

## 2011-12-13 NOTE — Preoperative (Signed)
Beta Blockers   Reason not to administer Beta Blockers:Not Applicable 

## 2011-12-13 NOTE — H&P (Signed)
Devin Gonzalez   MRN: [id number]   Description: 56 year old male  Provider: Clovis Pu. Abbie Berling, MD  Department: [facility]        Diagnoses     Anal pain   - Primary    [phone]      Reason for Visit     Follow-up    internal hems       Reason For Visit History Recorded        Vitals - Last Recorded       BP Pulse Temp Resp Ht Wt    136/80 86 97.4 F (36.3 C) (Temporal) 18 5\' 7"  (1.702 m) 131 lb 9.6 oz (59.693 kg)         BMI              20.61 kg/m2                 Progress Notes   Patient ID: Devin Gonzalez, male   DOB: 12-09-1955, 56 y.o.   MRN: [id number]    Chief Complaint   Patient presents with   .  Follow-up       internal hems      HPI Devin Gonzalez is a 56 y.o. male.  Patient returns in followup for anal pain. He is no better on medical management. He continues to have severe anal pain when he sits on his buttocks. Medications are not helping. HPI    Past Medical History   Diagnosis  Date   .  Allergy     .  Hemorrhoids     .  Back pain         right side   .  Bunion, right foot     .  Left tennis elbow     .  Arthritis         hands, knees, right hip       Past Surgical History   Procedure  Date   .  Cyst on testicle         right   .  Appendectomy     .  Tonsillectomy     .  Knee surgery         arthroscopic surg /  bil knees   .  Fatty tumor         removal right forearm   .  Colonoscopy w/ polypectomy     .  Sphincterotomy  08/28/2011       Procedure: SPHINCTEROTOMY;  Surgeon: Clovis Pu. Jalil Lorusso, MD;  Location: [facility] OR;  Service: General;  Laterality: N/A;  Lateral internal sphincterotomy   .  Hemorrhoid surgery  08/28/2011       Procedure: HEMORRHOIDECTOMY;  Surgeon: Clovis Pu. Ketrina Boateng, MD;  Location: [facility] OR;  Service: General;  Laterality: N/A;  ,possible hemorrhoidectomy      No family history on file.   Social History History   Substance Use Topics   .  Smoking status:  Current Every Day Smoker -- 0.5  packs/day for 20 years       Types:  Cigarettes   .  Smokeless tobacco:  Never Used   .  Alcohol Use:  14.0 oz/week       28 drink(s) per week      No Known Allergies    Current Outpatient Prescriptions   Medication  Sig  Dispense  Refill   .  Ascorbic Acid (VITAMIN C PO)  Take 1 tablet by mouth daily.         Marland Kitchen  DiphenhydrAMINE HCl (BENADRYL ALLERGY PO)  Take 1 tablet by mouth at bedtime.         .  lidocaine (XYLOCAINE JELLY) 2 % jelly  Apply topically as needed.   30 mL   0   .  mometasone (NASONEX) 50 MCG/ACT nasal spray  Place 2 sprays into the nose daily.         .  montelukast (SINGULAIR) 10 MG tablet  Take 10 mg by mouth at bedtime.         Marland Kitchen  oxyCODONE-acetaminophen (ROXICET) 5-325 MG per tablet  Take 1 tablet by mouth every 4 (four) hours as needed for pain.   30 tablet   0   .  polyethylene glycol powder (GLYCOLAX) powder  Take 17 g by mouth daily.   255 g   0   .  Tetrahydrozoline HCl (VISINE OP)  Place 2-4 drops into both eyes 2 (two) times daily as needed. For dry eyes         .  vitamin E 400 UNIT capsule  Take 400 Units by mouth daily.         Marland Kitchen  witch hazel-glycerin (TUCKS) pad  Place 1 application rectally as needed. For hemmoroids            Review of Systems Review of Systems  Constitutional: Negative.   Gastrointestinal: Positive for anal bleeding.    Blood pressure 136/80, pulse 86, temperature 97.4 F (36.3 C), temperature source Temporal, resp. rate 18, height 5\' 7"  (1.702 m), weight 131 lb 9.6 oz (59.693 kg).   Physical Exam Physical Exam  Constitutional: He appears well-developed and well-nourished.  HENT:   Head: Normocephalic and atraumatic.  Eyes: EOM are normal. Pupils are equal, round, and reactive to light.  Genitourinary:           Assessment Severe anal pain 3 months after lateral internal sphincterotomy and hemorrhoidectomy   Plan Pt is no better.  Recommend EUA to better evaluate pain in anal canal. The procedure has been  discussed with the patient.  Alternative therapies have been discussed with the patient.  Operative risks include bleeding,  Infection,  Organ injury,  Nerve injury,  Blood vessel injury,  DVT,  Pulmonary embolism,  Death,  And possible reoperation.  Fecal incontinence is another possible risk.   Medical management risks include worsening of present situation.  The success of the procedure is 50 -90 % at treating patients symptoms.  The patient understands and agrees to proceed.       Devin Fuhr A. 12/05/2011, 3:39 PM

## 2011-12-13 NOTE — Anesthesia Preprocedure Evaluation (Signed)
Anesthesia Evaluation  Patient identified by MRN, date of birth, ID band Patient awake    Reviewed: Allergy & Precautions, H&P , NPO status , Patient's Chart, lab work & pertinent test results  Airway Mallampati: II      Dental   Pulmonary neg pulmonary ROS,  breath sounds clear to auscultation        Cardiovascular negative cardio ROS  Rhythm:Regular Rate:Normal     Neuro/Psych    GI/Hepatic negative GI ROS, Neg liver ROS,   Endo/Other  negative endocrine ROS  Renal/GU negative Renal ROS     Musculoskeletal   Abdominal   Peds  Hematology negative hematology ROS (+)   Anesthesia Other Findings   Reproductive/Obstetrics                           Anesthesia Physical Anesthesia Plan  ASA: II  Anesthesia Plan: General   Post-op Pain Management:    Induction: Intravenous  Airway Management Planned: Oral ETT  Additional Equipment:   Intra-op Plan:   Post-operative Plan: Extubation in OR  Informed Consent: I have reviewed the patients History and Physical, chart, labs and discussed the procedure including the risks, benefits and alternatives for the proposed anesthesia with the patient or authorized representative who has indicated his/her understanding and acceptance.   Dental advisory given  Plan Discussed with: CRNA, Anesthesiologist and Surgeon  Anesthesia Plan Comments:         Anesthesia Quick Evaluation

## 2011-12-13 NOTE — Interval H&P Note (Signed)
History and Physical Interval Note:  12/13/2011 9:05 AM  Devin Gonzalez  has presented today for surgery, with the diagnosis of anal pain  The various methods of treatment have been discussed with the patient and family. After consideration of risks, benefits and other options for treatment, the patient has consented to  Procedure(s) (LRB) with comments: EXAM UNDER ANESTHESIA (N/A) as a surgical intervention .  The patient's history has been reviewed, patient examined, no change in status, stable for surgery.  I have reviewed the patient's chart and labs.  Questions were answered to the patient's satisfaction.     Saamir Armstrong A.

## 2011-12-13 NOTE — Anesthesia Procedure Notes (Signed)
Procedure Name: LMA Insertion Date/Time: 12/13/2011 10:51 AM Performed by: Jerilee Hoh Pre-anesthesia Checklist: Patient identified, Emergency Drugs available, Suction available and Patient being monitored Patient Re-evaluated:Patient Re-evaluated prior to inductionOxygen Delivery Method: Circle system utilized Preoxygenation: Pre-oxygenation with 100% oxygen Intubation Type: IV induction LMA: LMA inserted LMA Size: 4.0 Tube type: Oral Number of attempts: 1 Placement Confirmation: positive ETCO2 and breath sounds checked- equal and bilateral Tube secured with: Tape Dental Injury: Teeth and Oropharynx as per pre-operative assessment

## 2011-12-14 NOTE — Op Note (Signed)
NAME:  Devin Gonzalez, Devin Gonzalez NO.:  [id number]  MEDICAL RECORD NO.:  [id number]  LOCATION:  [facility]                         FACILITY:  [facility]  PHYSICIAN:  Maisie Fus A. Evo Aderman, M.D.DATE OF BIRTH:  [birth year]  DATE OF PROCEDURE:  12/13/2011 DATE OF DISCHARGE:  12/13/2011                              OPERATIVE REPORT   PREOPERATIVE DIAGNOSES:  Persistent chronic anal pain with history of chronic midline anal fissure and previous hemorrhoidectomy 2 months ago.  POSTOPERATIVE DIAGNOSIS:  Persistent chronic midline anal fissure.  PROCEDURE: 1. Exam under anesthesia. 2. Lateral internal sphincterotomy.  SURGEON:  Maisie Fus A. Raoul Ciano, MD  ANESTHESIA:  LMA with Exparel diluted by 20 mL of saline for perianal block.  EBL:  Minimal.  SPECIMENS:  None.  INDICATIONS FOR PROCEDURE:  The patient is a 56 year old male, who had previous hemorrhoid surgery in August of 2013.  He also had a chronic anal fissure and underwent lateral internal sphincterotomy and 3 column hemorrhoidectomy.  He has persistent chronic anal pain despite medical management and appropriate 3 month followup.  Exam reveals no external cause of this and he has remnants of an old midline anal fissure.  This does not appear deep but again he is requiring narcotics for his pain at this point in time and the only thing I could offer him was exam under anesthesia to reassess.  Risks, benefits, and alternative therapies discussed.  Risk of incontinence were discussed with him since this could be repeat sphincterotomy as well as finding an abscess or other issue causing his discomfort.  After discussing this and trying maximal medical management, he wished to proceed with the exam under anesthesia.  DESCRIPTION OF PROCEDURE:  The patient was met in the holding area. Questions were answered.  He was taken back to the operating room where general anesthesia was initiated after being placed supine.  He was placed in  lithotomy and padded appropriately.  A time-out was done and perianal region was prepped and draped in sterile fashion.  Digital examination was normal.  Anoscope was placed and there was a chronic small posterior midline anal fissure noted.  This was not very deep and no sphincter was exposed.  His previous hemorrhoidectomy sites were clean, dry, and intact with no signs of infection.  He had no anal stricture.  There was no mass.  There was no evidence of any malignancy or growth or other abnormality except for the very superficial posterior midline anal fissure.  I elected to go ahead and repeat his lateral internal sphincterotomy in this setting after palpation of his internal sphincter, especially in the right lateral position.  It felt very thin and had released but there still was sphincter there.  I felt that since he was having all this pain that another attempt at releasing the internal sphincter was necessary.  I did not feel horribly hypertrophied with all his pain and his failure of medical management.  This is the only abnormality I could find.  Using 11 blade, I was able to palpate the intersphincteric groove and placed a knife within this intersphincteric groove, positioning the cutting edge toward the internal sphincter.  The internal sphincter was divided.  This was  all the way up to the dentate line.  I was able to palpate the external sphincter and it was intact.  This appeared well released.  Hemostasis was achieved with pressure.  Sterile packing was then placed in the anal canal of Surgicel and Gelfoam.  Perianal block of Exparel was placed for local anesthetic purpose.  He tolerated the procedure well.  He was taken out of lithotomy, extubated, and taken to recovery in stable condition.     Detrich Rakestraw A. Rosealyn Little, M.D.     TAC/MEDQ  D:  12/13/2011  T:  12/14/2011  Job:  [id number]

## 2011-12-15 ENCOUNTER — Encounter (HOSPITAL_COMMUNITY): Payer: Self-pay | Admitting: Surgery

## 2011-12-15 NOTE — Anesthesia Postprocedure Evaluation (Signed)
  Anesthesia Post-op Note  Patient: Devin Gonzalez  Procedure(s) Performed: Procedure(s) (LRB) with comments: EXAM UNDER ANESTHESIA (N/A)  Patient Location: PACU  Anesthesia Type:General  Level of Consciousness: awake  Airway and Oxygen Therapy: Patient Spontanous Breathing  Post-op Pain: mild  Post-op Assessment: Post-op Vital signs reviewed  Post-op Vital Signs: Reviewed  Complications: No apparent anesthesia complications

## 2012-01-04 ENCOUNTER — Telehealth (INDEPENDENT_AMBULATORY_CARE_PROVIDER_SITE_OTHER): Payer: Self-pay | Admitting: General Surgery

## 2012-01-04 NOTE — Telephone Encounter (Signed)
Called in Vicodin 5/325 1 tab po q 4-6 hrs prn for pain with no refill.  And refill his Lidocaine 2 % one time. Called into the [facility] pharmacy. [phone]. And I made patient apt to come in on 01-08-2012

## 2012-01-08 ENCOUNTER — Ambulatory Visit (INDEPENDENT_AMBULATORY_CARE_PROVIDER_SITE_OTHER): Payer: Commercial Managed Care - PPO | Admitting: Surgery

## 2012-01-08 ENCOUNTER — Encounter (INDEPENDENT_AMBULATORY_CARE_PROVIDER_SITE_OTHER): Payer: Self-pay | Admitting: Surgery

## 2012-01-08 VITALS — BP 150/98 | HR 105 | Temp 98.0°F | Resp 22 | Ht 64.0 in | Wt 132.8 lb

## 2012-01-08 DIAGNOSIS — K6289 Other specified diseases of anus and rectum: Secondary | ICD-10-CM

## 2012-01-08 MED ORDER — AMOXICILLIN-POT CLAVULANATE 875-125 MG PO TABS: 1.0000 | ORAL_TABLET | Freq: Two times a day (BID) | ORAL | Status: AC

## 2012-01-08 NOTE — Patient Instructions (Addendum)
Reduce Miralax to every third day.  Wipe less.  Place dry gauze   To reduce moisture.  Will refer to colorectal specialist.

## 2012-01-08 NOTE — Progress Notes (Signed)
Pt returns after lateral internal sphincterotomy for chronic anal fissure.  He is no better.  Still has persistent anal and rectal pain.  Having 5 BM per day. Still has significant pain with sitting.  No fever or chills.  Occasional incontinence to gas but controls solids.   Exam: significant moisture and skin excoriation.  Tender along anal canal.  Tone intact.  Significant skin breakdown and irritation.  Impression: Chronic anal pain despite 2 previous lateral internal sphincterotomies and history of chronic anal fissure nonhealing  Plan: At this point in time I will refer her to see Dr. Maisie Fus for opinion since he is no better. I've asked him to cut back on his MiraLAX and to decrease the amount of wiping since he has significant chronic local irritation. Etiology of his pain at this point is unclear to me. He may have proctitis and I will place him on Augmentin for 10 days. Encouraged sitz baths.

## 2012-01-24 ENCOUNTER — Ambulatory Visit (INDEPENDENT_AMBULATORY_CARE_PROVIDER_SITE_OTHER): Payer: Commercial Managed Care - PPO | Admitting: General Surgery

## 2012-01-24 ENCOUNTER — Encounter (INDEPENDENT_AMBULATORY_CARE_PROVIDER_SITE_OTHER): Payer: Self-pay | Admitting: General Surgery

## 2012-01-24 VITALS — BP 142/84 | HR 103 | Temp 98.2°F | Resp 18 | Ht 64.0 in | Wt 130.0 lb

## 2012-01-24 DIAGNOSIS — K6289 Other specified diseases of anus and rectum: Secondary | ICD-10-CM

## 2012-01-24 NOTE — Patient Instructions (Signed)
I would like you to stop the Miralax and start taking a fiber supplement like metamucil.  Use the chart below to decide how much you need to take.    Start using a diaper rash cream like desitin to help protect your skin from moisture.    I will see you back in 4 weeks.   Fiber Chart  You should 25-30g of fiber per day and drinking 8 glasses of water to help your bowels move regularly.  In the chart below you can look up how much fiber you are getting in an average day.  If you are not getting enough fiber, you should add a fiber supplement to your diet.  Examples of this include Metamucil, FiberCon and Citrucel.  These can be purchased at your local grocery store or pharmacy.      [email or web address]

## 2012-01-24 NOTE — Progress Notes (Signed)
Chief Complaint  Patient presents with  . Establish Care    rectal pain    HISTORY: VERTIS SCHEIB is a 57 y.o. male who presents to the office with rectal pain.  Other symptoms include occasional bleeding.  This had been occurring for several months.  He has underwent a 3 column hemorrhoidectomy and 2 lateral internal sphincterotomies in the past year.  Hard stools and sitting makes the symptoms worse.  He describes the pain as sharp twinges that come and go as well as constant rectal pressure.  They get better on the diltiazem cream.  Lidocaine cream works too.  He is incontinent to gas but can control his stool.  His bowel habits are irregular and he has 3-4 BM's a day.  His bowel movements are loose.  His fiber intake is moderate.  His last colonoscopy was 6/13 and showed a couple small polyps and internal hemorrhoids.     Past Medical History  Diagnosis Date  . Allergy   . Hemorrhoids   . Back pain     right side  . Bunion, right foot   . Left tennis elbow   . Arthritis     hands, knees, right hip      Past Surgical History  Procedure Date  . Cyst on testicle     right  . Appendectomy   . Tonsillectomy   . Knee surgery     arthroscopic surg /  bil knees  . Fatty tumor     removal right forearm  . Colonoscopy w/ polypectomy   . Sphincterotomy 08/28/2011    Procedure: SPHINCTEROTOMY;  Surgeon: Clovis Pu. Cornett, MD;  Location: [facility] OR;  Service: General;  Laterality: N/A;  Lateral internal sphincterotomy  . Hemorrhoid surgery 08/28/2011    Procedure: HEMORRHOIDECTOMY;  Surgeon: Clovis Pu. Cornett, MD;  Location: [facility] OR;  Service: General;  Laterality: N/A;  ,possible hemorrhoidectomy  . Examination under anesthesia 12/13/2011    Procedure: EXAM UNDER ANESTHESIA;  Surgeon: Maisie Fus A. Cornett, MD;  Location: [facility] OR;  Service: General;  Laterality: N/A;        Current Outpatient Prescriptions  Medication Sig Dispense Refill  . Ascorbic Acid (VITAMIN C PO) Take 1 tablet by mouth  daily.      . DiphenhydrAMINE HCl (BENADRYL ALLERGY PO) Take 1 tablet by mouth at bedtime.      . lidocaine (XYLOCAINE JELLY) 2 % jelly Apply topically as needed.  30 mL  0  . mometasone (NASONEX) 50 MCG/ACT nasal spray Place 2 sprays into the nose daily.      . montelukast (SINGULAIR) 10 MG tablet Take 10 mg by mouth at bedtime.      Marland Kitchen oxyCODONE-acetaminophen (ROXICET) 5-325 MG per tablet Take 1 tablet by mouth every 4 (four) hours as needed for pain.  30 tablet  0  . oxyCODONE-acetaminophen (ROXICET) 5-325 MG per tablet Take 1 tablet by mouth every 4 (four) hours as needed for pain.  30 tablet  0  . oxyCODONE-acetaminophen (ROXICET) 5-325 MG per tablet Take 1 tablet by mouth every 4 (four) hours as needed for pain.  30 tablet  0  . polyethylene glycol powder (GLYCOLAX) powder Take 17 g by mouth daily.  255 g  0  . Tetrahydrozoline HCl (VISINE OP) Place 2-4 drops into both eyes 2 (two) times daily as needed. For dry eyes      . vitamin E 400 UNIT capsule Take 400 Units by mouth daily.      Marland Kitchen  witch hazel-glycerin (TUCKS) pad Place 1 application rectally as needed. For hemmoroids          No Known Allergies    No family history on file.  History   Social History  . Marital Status: Single    Spouse Name: N/A    Number of Children: N/A  . Years of Education: N/A   Social History Main Topics  . Smoking status: Current Every Day Smoker -- 0.5 packs/day for 20 years    Types: Cigarettes  . Smokeless tobacco: Never Used  . Alcohol Use: 14.0 oz/week    28 drink(s) per week  . Drug Use: No  . Sexually Active: None   Other Topics Concern  . None   Social History Narrative  . None      REVIEW OF SYSTEMS - PERTINENT POSITIVES ONLY: Review of Systems - General ROS: negative for - chills, fever or weight loss Hematological and Lymphatic ROS: negative for - bleeding problems, blood clots or bruising Respiratory ROS: no cough, shortness of breath, or wheezing Cardiovascular ROS: no  chest pain or dyspnea on exertion Gastrointestinal ROS: no abdominal pain, change in bowel habits, or black or bloody stools Genito-Urinary ROS: no dysuria, trouble voiding, or hematuria  EXAM: Filed Vitals:   01/24/12 1517  BP: 142/84  Pulse: 103  Temp: 98.2 F (36.8 C)  Resp: 18    General appearance: alert and cooperative Resp: clear to auscultation bilaterally Cardio: regular rate and rhythm GI: soft, non-tender; bowel sounds normal; no masses,  no organomegaly Anal Exam Findings: previous surgical scars appear to be source of pain.  These do not seem to be completely healed.  Pain a tad out of proportion to exam.  Good sphincter tone, able to tolerate a DRE.  no palpable fissure.  No fluctuant masses  ASSESSMENT AND PLAN: DONTRAY HABERLAND is a 57 y.o. M with a very complex case of post operative anal pain.  It appears to me that his pain is coming from these incisions that aren't quite healed yet.  I think that moisture may be hindering his healing.  I have recommended that he start using diaper rash cream to act as a barrier.  I have asked him to stop the miralax and start taking fiber supplements to help him get to one soft BM a day.  I think this will decrease the fecal leakage and also reduce to amount of trauma to his perianal region with each BM.  He will try to use lidocaine cream for pain.  He will continue his sitz baths QID.  I will then see him back in 4 weeks to evaluate his progression.      Vanita Panda, MD Colon and Rectal Surgery / General Surgery [facility]      Visit Diagnoses: 1. Anal pain     Primary Care Physician: Gaspar Garbe, MD

## 2012-01-26 ENCOUNTER — Encounter (INDEPENDENT_AMBULATORY_CARE_PROVIDER_SITE_OTHER): Payer: Self-pay

## 2012-01-26 ENCOUNTER — Telehealth (INDEPENDENT_AMBULATORY_CARE_PROVIDER_SITE_OTHER): Payer: Self-pay

## 2012-01-26 NOTE — Telephone Encounter (Signed)
I called to let the pt know what Dr Maisie Fus said and had to leave a message for him to call me back.

## 2012-01-26 NOTE — Telephone Encounter (Signed)
The pt called me back.  He states his job is very strenuous.  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  He has not been walking much since the surgery due to pain.  He also gets pain when he lifts anything heavy, including his grandchild.  I told him whatever causes pain he should limit.  He will need a note specifically stating what he can and can't do.  I told him I will discuss it with Dr Maisie Fus this afternoon.

## 2012-01-26 NOTE — Telephone Encounter (Signed)
I typed up a letter after talking to the pt.  I left him a message saying his note is ready up front for pick up.

## 2012-01-26 NOTE — Telephone Encounter (Signed)
Message copied by Ivory Broad on Fri Jan 26, 2012  9:12 AM ------      Message from: [place], Broadus John.      Created: Thu Jan 25, 2012  4:56 PM      Regarding: RE: Dr. Maisie Fus      Contact: [phone]       He can do whatever he can tolerate at work.  If he needs a note for reduced hours or activity, I would be glad to give him one.  Just needs to let us know what he wants it to say.            AT      ----- Message -----         From: Ivory Broad, RN         Sent: 01/25/2012   2:55 PM           To: Romie Levee, MD      Subject: Annell Greening: Dr. Maisie Fus                                           I wasn't with you when you saw him.  Can you please advise me/            Bettina Warn      ----- Message -----         From: Brigitt Heger         Sent: 01/25/2012   2:25 PM           To: Ivory Broad, RN      Subject: Dr. Maisie Fus                                               Pt would like to know if he needs to change his 'work habits', and if he needs a note.  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  Pt just got new phone and has trouble operating it. You may have to call a few times :-)  Thx.

## 2012-02-16 ENCOUNTER — Encounter (INDEPENDENT_AMBULATORY_CARE_PROVIDER_SITE_OTHER): Payer: Self-pay | Admitting: General Surgery

## 2012-02-16 ENCOUNTER — Encounter (INDEPENDENT_AMBULATORY_CARE_PROVIDER_SITE_OTHER): Payer: Self-pay

## 2012-02-16 ENCOUNTER — Ambulatory Visit (INDEPENDENT_AMBULATORY_CARE_PROVIDER_SITE_OTHER): Payer: Commercial Managed Care - PPO | Admitting: General Surgery

## 2012-02-16 ENCOUNTER — Encounter (INDEPENDENT_AMBULATORY_CARE_PROVIDER_SITE_OTHER): Payer: Commercial Managed Care - PPO | Admitting: General Surgery

## 2012-02-16 ENCOUNTER — Telehealth (INDEPENDENT_AMBULATORY_CARE_PROVIDER_SITE_OTHER): Payer: Self-pay

## 2012-02-16 VITALS — BP 140/82 | HR 72 | Temp 98.4°F | Resp 18 | Wt 132.0 lb

## 2012-02-16 DIAGNOSIS — K6289 Other specified diseases of anus and rectum: Secondary | ICD-10-CM

## 2012-02-16 MED ORDER — LIDOCAINE HCL 2 % EX GEL: CUTANEOUS | Status: DC | PRN

## 2012-02-16 NOTE — Progress Notes (Signed)
Devin Gonzalez is a 57 y.o. male who is here for a follow up visit regarding post operative anal pain.  He is still having issues with constipation.  He is drinking plenty of liquids and doing his sitz baths regularly.    Objective: Filed Vitals:   02/16/12 1546  BP: 140/82  Pulse: 72  Temp: 98.4 F (36.9 C)  Resp: 18    General appearance: alert and cooperative GI: normal findings: soft, non-tender  Perianal- appears to be healing ok, area lest indurated and erythematous   Assessment and Plan: Pt to increased his fiber and water intake.  I told him that controlling his bowel habits would be the key to healing from this.  He should cont the lidocaine and sitz baths.  I will see him back in 4 weeks.    Vanita Panda, MD [facility], [place] [phone]

## 2012-02-16 NOTE — Patient Instructions (Signed)
Continue to increase your fiber intake to 25-30g per day.  Make sure to drink 8 glasses of water daily.  If you need a laxative, use miralax.  Fiber Chart  You should 25-30g of fiber per day and drinking 8 glasses of water to help your bowels move regularly.  In the chart below you can look up how much fiber you are getting in an average day.  If you are not getting enough fiber, you should add a fiber supplement to your diet.  Examples of this include Metamucil, FiberCon and Citrucel.  These can be purchased at your local grocery store or pharmacy.      [email or web address]   GETTING TO GOOD BOWEL HEALTH. Irregular bowel habits such as constipation can lead to many problems over time.  Having one soft bowel movement a day is the most important way to prevent further problems.  The anorectal canal is designed to handle stretching and feces to safely manage our ability to get rid of solid waste (feces, poop, stool) out of our body.  BUT, hard constipated stools can act like ripping concrete bricks causing inflamed hemorrhoids, anal fissures, abdominal pain and bloating.     The goal: ONE SOFT BOWEL MOVEMENT A DAY!  To have soft, regular bowel movements:    Drink at least 8 tall glasses of water a day.     Take plenty of fiber.  Fiber is the undigested part of plant food that passes into the colon, acting s "natures broom" to encourage bowel motility and movement.  Fiber can absorb and hold large amounts of water. This results in a larger, bulkier stool, which is soft and easier to pass. Work gradually over several weeks up to 6 servings a day of fiber (25g a day even more if needed) in the form of: o Vegetables -- Root (potatoes, carrots, turnips), leafy green (lettuce, salad greens, celery, spinach), or cooked high residue (cabbage, broccoli, etc) o Fruit -- Fresh (unpeeled skin & pulp), Dried (prunes, apricots, cherries, etc ),  or stewed (  applesauce)  o Whole grain breads, pasta, etc (whole wheat)  o Bran cereals    Bulking Agents -- This type of water-retaining fiber generally is easily obtained each day by one of the following:  o Psyllium bran -- The psyllium plant is remarkable because its ground seeds can retain so much water. This product is available as Metamucil, Konsyl, Effersyllium, Per Diem Fiber, or the less expensive generic preparation in drug and health food stores. Although labeled a laxative, it really is not a laxative.  o Methylcellulose -- This is another fiber derived from wood which also retains water. It is available as Citrucel. o Polyethylene Glycol - and "artificial" fiber commonly called Miralax or Glycolax.  It is helpful for people with gassy or bloated feelings with regular fiber o Flax Seed - a less gassy fiber than psyllium   No reading or other relaxing activity while on the toilet. If bowel movements take longer than 5 minutes, you are too constipated   AVOID CONSTIPATION.  High fiber and water intake usually takes care of this.  Sometimes a laxative is needed to stimulate more frequent bowel movements, but    Laxatives are not a good long-term solution as it can wear the colon out. o Osmotics (Milk of Magnesia, Fleets phosphosoda, Magnesium citrate, MiraLax, GoLytely) are safer than  o Stimulants (Senokot, Castor Oil, Dulcolax, Ex Lax)    o Do not take laxatives for more than 7days  in a row.    IF SEVERELY CONSTIPATED, try a Bowel Retraining Program: o Do not use laxatives.  o Eat a diet high in roughage, such as bran cereals and leafy vegetables.  o Drink six (6) ounces of prune or apricot juice each morning.  o Eat two (2) large servings of stewed fruit each day.  o Take one (1) heaping tablespoon of a psyllium-based bulking agent twice a day. Use sugar-free sweetener when possible to avoid excessive calories.  o Eat a normal breakfast.  o Set aside 15 minutes after breakfast to sit on the  toilet, but do not strain to have a bowel movement.  o If you do not have a bowel movement by the third day, use an enema and repeat the above steps.

## 2012-02-16 NOTE — Telephone Encounter (Signed)
Message copied by Ivory Broad on Fri Feb 16, 2012 12:54 PM ------      Message from: Rise Paganini      Created: Fri Feb 16, 2012 12:28 PM      Regarding: Devin Gonzalez: [phone]       Patient stated that he is unable to get out today due to the weather. He stated that he is still in a lot of pain and would like to be seen as soon as possible. The next available is 03/15/12 and that is too long for him to wait. Please call to discuss. Thank you. ------

## 2012-02-16 NOTE — Telephone Encounter (Signed)
I called the pt back to get him rescheduled sooner.  He said he is having the pain like he was for 6 mos.  It is a burning pain and he has itching.  He is using desitin cream.  It is mostly when he has a bowel movement.  He had to leave a restaurant early due to pain one day.  When he sits for a while he says it feels he is tearing the area again.  I asked if he is sure he can't come today, the latest 3:30.  He agreed.  I told him if he has trouble getting here to call us back.

## 2012-02-27 ENCOUNTER — Encounter (INDEPENDENT_AMBULATORY_CARE_PROVIDER_SITE_OTHER): Payer: Commercial Managed Care - PPO | Admitting: General Surgery

## 2012-03-15 ENCOUNTER — Ambulatory Visit (INDEPENDENT_AMBULATORY_CARE_PROVIDER_SITE_OTHER): Payer: Commercial Managed Care - PPO | Admitting: General Surgery

## 2012-03-15 ENCOUNTER — Telehealth (INDEPENDENT_AMBULATORY_CARE_PROVIDER_SITE_OTHER): Payer: Self-pay

## 2012-03-15 ENCOUNTER — Encounter (INDEPENDENT_AMBULATORY_CARE_PROVIDER_SITE_OTHER): Payer: Self-pay | Admitting: General Surgery

## 2012-03-15 VITALS — BP 158/82 | HR 97 | Temp 98.9°F | Resp 18 | Ht 64.0 in | Wt 131.2 lb

## 2012-03-15 DIAGNOSIS — K6289 Other specified diseases of anus and rectum: Secondary | ICD-10-CM

## 2012-03-15 NOTE — Telephone Encounter (Signed)
Pt returned call and will do his best to get to the clinic by 3 pm, as requested.

## 2012-03-15 NOTE — Progress Notes (Signed)
Devin Gonzalez is a 57 y.o. male who is here for a follow up visit regarding his chronic anal pain.  He doesn't think his pain is any better.  He uses lidocaine, which helps for a couple hrs.  He is still using the desitin cream to protect his skin.  He is also having some itching and blood on the toilet paper.  Objective: Filed Vitals:   03/15/12 1600  BP: 158/82  Pulse: 97  Temp: 98.9 F (37.2 C)  Resp: 18    General appearance: alert and cooperative GI: soft, non-tender; bowel sounds normal; no masses,  no organomegaly Perineal exam: still with some excoriated perianal tissue, but overall slightly better  Assessment and Plan: Devin Gonzalez is still having post op pain.  I am concerned that he may have an occult fistula or abscess.  I have ordered an MRI of his pelvis to further delineate any problems internally.  If this is negative, I will plan for an EUA and possible skin biopsy or botox injection.      Vanita Panda, MD [facility], [place] [phone]

## 2012-03-15 NOTE — Patient Instructions (Signed)
Continue to increase fiber intake.  Continue local therapies for pain.

## 2012-03-15 NOTE — Telephone Encounter (Signed)
Left message for pt to see if he can come in earlier today around 3 pm to check in.

## 2012-03-18 ENCOUNTER — Telehealth (INDEPENDENT_AMBULATORY_CARE_PROVIDER_SITE_OTHER): Payer: Self-pay | Admitting: General Surgery

## 2012-03-18 NOTE — Telephone Encounter (Signed)
Spoke with patient he is aware of appt 03/22/12 @1 :15 pm

## 2012-03-22 ENCOUNTER — Ambulatory Visit
Admission: RE | Admit: 2012-03-22 | Discharge: 2012-03-22 | Disposition: A | Discharge status: Home / Self Care | Payer: 59 | Source: Ambulatory Visit | Attending: General Surgery | Admitting: General Surgery

## 2012-03-22 DIAGNOSIS — K6289 Other specified diseases of anus and rectum: Secondary | ICD-10-CM

## 2012-03-22 MED ORDER — GADOBENATE DIMEGLUMINE 529 MG/ML IV SOLN
12.0000 mL | Freq: Once | INTRAVENOUS | Status: AC | PRN
  Administered 2012-03-22: 12 mL via INTRAVENOUS

## 2012-03-25 ENCOUNTER — Other Ambulatory Visit (INDEPENDENT_AMBULATORY_CARE_PROVIDER_SITE_OTHER): Payer: Self-pay | Admitting: General Surgery

## 2012-03-25 ENCOUNTER — Telehealth (INDEPENDENT_AMBULATORY_CARE_PROVIDER_SITE_OTHER): Payer: Self-pay

## 2012-03-25 DIAGNOSIS — N410 Acute prostatitis: Secondary | ICD-10-CM

## 2012-03-25 MED ORDER — CIPROFLOXACIN HCL 500 MG PO TABS: 500.0000 mg | ORAL_TABLET | Freq: Two times a day (BID) | ORAL | Status: AC

## 2012-03-25 NOTE — Progress Notes (Signed)
pts mri reviewed.  Findings consistent with prostatitis.  Will start Cipro.  Will get GU consult if symptoms not resolved with antibiotics.

## 2012-03-25 NOTE — Progress Notes (Signed)
I notified the pt of the results and that the antibiotics were sent to the pharmacy.  I made him an appointment to come back in to see DrThomas.

## 2012-03-25 NOTE — Telephone Encounter (Signed)
I called the pt and let him know that his MRI showed prostatitis and Dr Maisie Fus Prescribed antibiotics.  She wants to see him back in 2 weeks.  I gave him an appointment.  He is coming by tomorrow to get updates on his disability paper work.

## 2012-03-29 ENCOUNTER — Telehealth (INDEPENDENT_AMBULATORY_CARE_PROVIDER_SITE_OTHER): Payer: Self-pay

## 2012-03-29 NOTE — Telephone Encounter (Signed)
Refill OK.  Needs to complete antibiotic course before we know if that will help his pain.

## 2012-03-29 NOTE — Telephone Encounter (Signed)
I called in a refill of Lidocaine to [facility] outpt pharmacy.  I notified the pt and gave him the message from Dr Maisie Fus.   He also asked if he were to need it, could he take a half of a Viagra pill.  I said I don't think it would interfere with what we are treating.  He has a prescription for it from his medical dr.   He says he has no hx of heart problems.

## 2012-03-29 NOTE — Telephone Encounter (Signed)
The pt called and states he is on the antibiotics but is still having the same pain he has been.  He states he had blood when he wiped a couple days ago.  He doesn't understand why the MRI didn't show any anal problems because he still feels the same.  He wants to know if he can use Lidocaine cream and I said that is fine.  He says he needs a refill sent to [facility].  I told him I have to run this message by Dr Maisie Fus and see if she will authorize refill.  I can call him back and let him know.

## 2012-04-10 ENCOUNTER — Other Ambulatory Visit (INDEPENDENT_AMBULATORY_CARE_PROVIDER_SITE_OTHER): Payer: Self-pay | Admitting: General Surgery

## 2012-04-10 ENCOUNTER — Ambulatory Visit (INDEPENDENT_AMBULATORY_CARE_PROVIDER_SITE_OTHER): Payer: Commercial Managed Care - PPO | Admitting: General Surgery

## 2012-04-10 ENCOUNTER — Encounter (INDEPENDENT_AMBULATORY_CARE_PROVIDER_SITE_OTHER): Payer: Self-pay | Admitting: General Surgery

## 2012-04-10 VITALS — BP 128/82 | HR 103 | Temp 97.8°F | Resp 18 | Ht 64.0 in | Wt 130.0 lb

## 2012-04-10 DIAGNOSIS — N419 Inflammatory disease of prostate, unspecified: Secondary | ICD-10-CM

## 2012-04-10 DIAGNOSIS — Z792 Long term (current) use of antibiotics: Secondary | ICD-10-CM

## 2012-04-10 NOTE — Progress Notes (Signed)
Devin Gonzalez is a 57 y.o. male who is here for a follow up visit regarding his chronic rectal pain.  An MRI was performed, which did not show any anorectal pathology but did show prostatitis.  He has tried 2 weeks of cipro but has had no relief of symptoms.  His anal skin issues are about the same.  He has had a couple episodes of bleeding when wiping.    Objective: Filed Vitals:   04/10/12 1631  BP: 128/82  Pulse: 103  Temp: 97.8 F (36.6 C)  Resp: 18    General appearance: alert and cooperative GI: soft, non-tender; bowel sounds normal; no masses,  no organomegaly   Assessment and Plan: I will set him up to see urology for treatment of his prostatitis.  I will see him back in 4-6 weeks.    Vanita Panda, MD [facility], [place] [phone]

## 2012-04-10 NOTE — Patient Instructions (Signed)
We will get you set up to see urology for your prostatitis.  Come back and see me in 4-6 weeks.

## 2012-04-11 ENCOUNTER — Telehealth (INDEPENDENT_AMBULATORY_CARE_PROVIDER_SITE_OTHER): Payer: Self-pay | Admitting: General Surgery

## 2012-04-11 NOTE — Telephone Encounter (Signed)
Left a message for patient to call back he has an appt on 05/02/12 at 2:15 with Dr Margarita Grizzle  He needs to bring insurance cards drivers LIC  Medication and 161.09 ded

## 2012-05-09 ENCOUNTER — Ambulatory Visit (INDEPENDENT_AMBULATORY_CARE_PROVIDER_SITE_OTHER): Payer: Commercial Managed Care - PPO | Admitting: General Surgery

## 2012-05-23 ENCOUNTER — Ambulatory Visit (INDEPENDENT_AMBULATORY_CARE_PROVIDER_SITE_OTHER): Payer: Self-pay | Admitting: General Surgery

## 2012-05-23 ENCOUNTER — Encounter (INDEPENDENT_AMBULATORY_CARE_PROVIDER_SITE_OTHER): Payer: Self-pay | Admitting: General Surgery

## 2012-05-23 VITALS — BP 160/90 | HR 90 | Temp 98.2°F | Resp 18 | Ht 65.0 in | Wt 132.5 lb

## 2012-05-23 DIAGNOSIS — K6289 Other specified diseases of anus and rectum: Secondary | ICD-10-CM

## 2012-05-23 MED ORDER — LIDOCAINE HCL 2 % EX GEL: CUTANEOUS | Status: AC | PRN

## 2012-05-23 NOTE — Progress Notes (Signed)
Devin Gonzalez is a 58 y.o. male who is here for a follow up visit regarding his chronic rectal pain. An MRI was performed, which did not show any anorectal pathology but did show prostatitis. He has tried 2 weeks of cipro but has had no relief of symptoms.  He saw Dr Margarita Grizzle, who thought that his symptoms weren't consistent with prostatitis.  His anal skin issues are about the same. He has had a couple episodes of bleeding when wiping.   Objective:  Filed Vitals:   05/23/12 1555  BP: 160/90  Pulse: 90  Temp: 98.2 F (36.8 C)  Resp: 18   General appearance: alert and cooperative  GI: soft, non-tender; bowel sounds normal; no masses, no organomegaly  Perianal: excoriation circumferentially, no masses  Assessment and Plan Sitz baths TID, No rubbing, pat dry and place desitin cream, use lidocaine for pain and itching, do not scratch   Vanita Panda, MD  Colorectal and General Surgery [facility]

## 2012-05-23 NOTE — Patient Instructions (Signed)
Pruritus Ani  What is Pruritus Ani (proo-r-tus a-n)? Itching around the anal area is called pruritus ani. This condition results in a compelling urge to scratch. What causes this to happen? Several factors may be at fault. A common cause is excessive moisture in the anal area. Moisture may be due to perspiration or a small amount of residual stool around the anal area. Pruritis ani may be a symptom of other common anal conditions such as hemorrhoids and anal fissures. The initial condition can be made worse by scratching, vigorous cleansing of the area or overuse of topical treatments. In some individuals pruritus ani may be caused by eating certain foods, smoking and drinking alcoholic beverages, especially beer and wine. Food items that have been associated with pruritus ani include: . Coffee, Tea . Carbonated beverages . Milk products . Tomatoes and tomato products such as Ketchup . Cheese . Chocolate . Nuts Does Pruritus Ani result from lack of cleanliness? Cleanliness is almost never a factor. However, the natural tendency once a person develops this itching is to wash the area vigorously and frequently with soap and a washcloth. This almost always makes the problem worse by damaging the skin and washing away protective natural oils. What can be done to make this itching go away? A careful examination by a colon and rectal surgeon or other physician may identify a definite cause for the itching. Your physician can recommend treatment to eliminate the specific problem. Treatment of pruritus ani may include these three points. 1. AVOID MOISTURE in the anal area: . Apply either a few wisps of cotton, a 4 x 4 gauze or some cornstarch powder to keep the area dry. . Avoid all medicated, perfumed and deodorant powders. 2. AVOID FURTHER TRAUMA to the affected area: . Do not use soap of any kind on the anal area. . Do not scrub the anal area with anything - even toilet paper. . For hygiene, it  is best to rinse with warm water and pat the area dry. Use wet toilet paper, baby wipes or a wet washcloth to blot the area clean. Never rub. . Try not to scratch the itchy area. Scratching produces more damage, which in turn makes the itching worse. For individuals that experience irresistible itching at night, wearing socks on the hands may be helpful. 3. USE ONLY MEDICATIONS AS DIRECTED BY YOUR PHYSICIAN. Apply prescription medications sparingly to the skin around the anal area and avoid rubbing. Prolonged use of prescribed or over the counter topical medications may result in irritation or skin dryness that can make the condition worse. How long does this treatment usually take? Most people experience some relief from itching within a week. If symptoms do not resolve after 6 weeks, a follow-up appointment with your colon and rectal surgeon may be needed.               2012 American Society of Colon & Rectal Surgeons  

## 2012-06-14 ENCOUNTER — Telehealth (INDEPENDENT_AMBULATORY_CARE_PROVIDER_SITE_OTHER): Payer: Self-pay

## 2012-06-14 DIAGNOSIS — K6289 Other specified diseases of anus and rectum: Secondary | ICD-10-CM

## 2012-06-14 MED ORDER — TRIAMCINOLONE ACETONIDE 0.5 % EX OINT: TOPICAL_OINTMENT | Freq: Two times a day (BID) | CUTANEOUS | Status: DC

## 2012-06-14 NOTE — Telephone Encounter (Signed)
The pt called regarding his restrictions and disability.  I told him Dr Maisie Fus thought he was back to work a long time ago.  He says he has been out since his 1st surgery.  He can't go back to that job with restrictions.  I asked if they are holding the job for him and he said no probably not.  He is using A and D ointment now instead of Desitin.  He gets the same pain as before but not as severe when he is lifting 30 lbs.  His bowel movements don't bother him as much now.  He can feel the raw area from the incision but feels like the A and D is helping it go down some.  I told him we file FMLA and Short Term Disability but not long term.  He states he has been on Long term for about 2 months.  He wants to know what to do and his insurance company needs to know what to do.  I will discuss this with Dr Maisie Fus.

## 2012-06-14 NOTE — Telephone Encounter (Signed)
Per Dr Maisie Fus he should be able to work without restrictions.  There are none that she is putting on him.  She will prescribe a Steroid cream to help him.Marland Kitchen Kenalog is ordered and routed to the pharmacy.  The pt is aware.

## 2012-07-08 ENCOUNTER — Ambulatory Visit (INDEPENDENT_AMBULATORY_CARE_PROVIDER_SITE_OTHER): Payer: Self-pay | Admitting: General Surgery

## 2013-01-06 ENCOUNTER — Telehealth (INDEPENDENT_AMBULATORY_CARE_PROVIDER_SITE_OTHER): Payer: Self-pay | Admitting: General Surgery

## 2013-01-06 NOTE — Telephone Encounter (Signed)
Pt called to report he has new, prolapsing hems.  He asked for pain meds and appt.  First appt available is 01/28/13 (left message on cell phone to call in the morning to okay and pick time.)  Instructed him to soak in warm baths, use TUCKS or soft cloths and call pharmacy to check on availability of lidocaine gel (had 3 RF in May 2014.)

## 2013-03-07 ENCOUNTER — Telehealth (INDEPENDENT_AMBULATORY_CARE_PROVIDER_SITE_OTHER): Payer: Self-pay | Admitting: *Deleted

## 2013-03-07 NOTE — Telephone Encounter (Signed)
Patient called to ask for a refill of medication, Lidocaine gel.  He states he is still having episodes of rectal pain.  Explained that I would send a message to Dr. Marcello Moores and ask her opinion then we will let him know.  Patient states understanding and agreeable.  Patient asks that if a prescription is called in then to use [facility] on [address].  Explained we will let him know first.

## 2013-03-10 NOTE — Telephone Encounter (Signed)
Patient called back to ask about Lidocaine prescription.  I spoke to Dr. Marcello Moores in office who states she would like to see patient before prescribing anything.  Informed patient and gave him 2 options for appts.  Patient states he will call back tomorrow once he knows which one he could do.

## 2013-03-24 ENCOUNTER — Encounter: Payer: Self-pay | Admitting: Internal Medicine

## 2013-04-25 ENCOUNTER — Encounter: Payer: Self-pay | Admitting: Gastroenterology

## 2013-05-12 ENCOUNTER — Encounter: Payer: Self-pay | Admitting: Internal Medicine

## 2013-05-12 ENCOUNTER — Ambulatory Visit (INDEPENDENT_AMBULATORY_CARE_PROVIDER_SITE_OTHER): Payer: BC Managed Care – PPO | Admitting: Internal Medicine

## 2013-05-12 VITALS — BP 162/90 | HR 100 | Ht 65.0 in | Wt 117.6 lb

## 2013-05-12 DIAGNOSIS — R1310 Dysphagia, unspecified: Secondary | ICD-10-CM

## 2013-05-12 DIAGNOSIS — K6289 Other specified diseases of anus and rectum: Secondary | ICD-10-CM

## 2013-05-12 DIAGNOSIS — L290 Pruritus ani: Secondary | ICD-10-CM

## 2013-05-12 DIAGNOSIS — R634 Abnormal weight loss: Secondary | ICD-10-CM

## 2013-05-12 NOTE — Patient Instructions (Signed)
You have been scheduled for an endoscopy with propofol. Please follow written instructions given to you at your visit today. If you use inhalers (even only as needed), please bring them with you on the day of your procedure.  Try over the counter Recticare or Balneol for the rectal itching.   I appreciate the opportunity to care for you.

## 2013-05-12 NOTE — Progress Notes (Signed)
Subjective:    Patient ID: Devin Gonzalez, male    DOB: 1955-01-06, 58 y.o.   MRN: [id number]  HPI Odynophagia, reflux and dysphagia with weight loss are main issues today. Over past weeks- months has developed burning odynophagia, reflux/regurgiatation and solid dysphagia with mid-sternal stick and regurgitation. Unable to eat well and is losing weigh. Having epigastric pain also. He saw Dr. Osborne Casco in Jan - started on PPI bid, no help. Labs showed mild transaminase elavation 47 AST and 63 ALT. Drinks alcohol intermittently not daily. Wt Readings from Last 3 Encounters:  05/12/13 117 lb 9.6 oz (53.343 kg)  05/23/12 132 lb 8 oz (60.102 kg)  04/10/12 130 lb (58.968 kg)   Also with chronic anal itching and pain.Had hemorrhoidectomy and has had persistent sx of this pre and post hemorrhoid surgery. Lidocaine gel helps some.  No Known Allergies Outpatient Prescriptions Prior to Visit  Medication Sig Dispense Refill  . Ascorbic Acid (VITAMIN C PO) Take 1 tablet by mouth daily.      . DiphenhydrAMINE HCl (BENADRYL ALLERGY PO) Take 1 tablet by mouth at bedtime.      . lidocaine (XYLOCAINE JELLY) 2 % jelly Apply topically as needed.  30 mL  3  . polyethylene glycol powder (GLYCOLAX) powder Take 17 g by mouth daily.  255 g  0  . Tetrahydrozoline HCl (VISINE OP) Place 2-4 drops into both eyes 2 (two) times daily as needed. For dry eyes      . triamcinolone ointment (KENALOG) 0.5 % Apply topically 2 (two) times daily.  30 g  1  . mometasone (NASONEX) 50 MCG/ACT nasal spray Place 2 sprays into the nose daily.      . montelukast (SINGULAIR) 10 MG tablet Take 10 mg by mouth at bedtime.      Marland Kitchen oxyCODONE-acetaminophen (ROXICET) 5-325 MG per tablet Take 1 tablet by mouth every 4 (four) hours as needed for pain.  30 tablet  0  . oxyCODONE-acetaminophen (ROXICET) 5-325 MG per tablet Take 1 tablet by mouth every 4 (four) hours as needed for pain.  30 tablet  0  . oxyCODONE-acetaminophen (ROXICET)  5-325 MG per tablet Take 1 tablet by mouth every 4 (four) hours as needed for pain.  30 tablet  0  . vitamin E 400 UNIT capsule Take 400 Units by mouth daily.      Marland Kitchen witch hazel-glycerin (TUCKS) pad Place 1 application rectally as needed. For hemmoroids       No facility-administered medications prior to visit.   Past Medical History  Diagnosis Date  . Allergy   . Hemorrhoids   . Back pain     right side  . Bunion, right foot   . Left tennis elbow   . Osteoarthritis     hands, knees, right hip  . Prostatitis   . Diverticulosis 2013  . Adenomatous colon polyp   . ED (erectile dysfunction)   . Tobacco abuse   . Reflux esophagitis   . Nonspecific elevation of levels of transaminase or lactic acid dehydrogenase (LDH)    Past Surgical History  Procedure Laterality Date  . Cyst on testicle Right   . Appendectomy    . Tonsillectomy    . Knee surgery Bilateral     arthroscopic surg /  bil knees  . Fatty tumor Right     removal forearm  . Colonoscopy w/ polypectomy    . Sphincterotomy  08/28/2011    Procedure: SPHINCTEROTOMY;  Surgeon: Joyice Faster. Cornett,  MD;  Location: [place];  Service: General;  Laterality: N/A;  Lateral internal sphincterotomy  . Hemorrhoid surgery  08/28/2011    Procedure: HEMORRHOIDECTOMY;  Surgeon: Joyice Faster. Cornett, MD;  Location: [place];  Service: General;  Laterality: N/A;  ,possible hemorrhoidectomy  . Examination under anesthesia  12/13/2011    Procedure: EXAM UNDER ANESTHESIA;  Surgeon: Marcello Moores A. Cornett, MD;  Location: [place];  Service: General;  Laterality: N/A;  . Anal fissure repair  2013    Cornett   History   Social History  . Marital Status: Single    Spouse Name: N/A    Number of Children: N/A  . Years of Education: N/A   Social History Main Topics  . Smoking status: Current Every Day Smoker -- 0.50 packs/day for 20 years    Types: Cigarettes  . Smokeless tobacco: Never Used  . Alcohol Use: 14.0 oz/week    28 drink(s) per week  . Drug  Use: No   Family History  Problem Relation Age of Onset  . Lung cancer Mother   . Lung cancer Brother     Review of Systems As per HPI, no fever, no recent Abx All other ROS negative    Objective:   Physical Exam General:  Well-developed, well-nourished and in no acute distress Eyes:  anicteric. ENT:   Mouth and posterior pharynx free of lesions. + dentures Neck:   supple w/o thyromegaly or mass.  Lungs: Clear to auscultation bilaterally. Heart:  S1S2, no rubs, murmurs, gallops. Abdomen:  soft, non-tender, no hepatosplenomegaly, hernia, or mass and BS+.  Rectal: Inspection - NL anoderm Lymph:  no cervical or supraclavicular adenopathy. Extremities:   no edema Skin   no rash. Neuro:  A&O x 3.  Psych:  appropriate mood and  Affect.   Data Reviewed: Dr. Osborne Casco notes, labs - will be scanned       Assessment & Plan:  Dysphagia/odynophagia787.20) - Plan: Ambulatory referral to Gastroenterology  Loss of weight - Plan: Ambulatory referral to Gastroenterology  Anal pain  Anal itching  1. Needs an EGD - will do tomorrow given severity of sxs, weight loss, etc. May need esophageal dilation. ? Severe GERD, infection, malignancy 2. Recticare/Balneol prn anal sxs The risks and benefits as well as alternatives of endoscopic procedure(s) have been discussed and reviewed. All questions answered. The patient agrees to proceed. Recheck LFT's at some point, may need cross sectional abd imaging   I appreciate the opportunity to care for this patient WN:IOEVOJJ,KKXFGHW W, MD

## 2013-05-13 ENCOUNTER — Encounter: Payer: Self-pay | Admitting: Internal Medicine

## 2013-05-13 ENCOUNTER — Other Ambulatory Visit: Payer: Self-pay

## 2013-05-13 ENCOUNTER — Ambulatory Visit (AMBULATORY_SURGERY_CENTER): Payer: BC Managed Care – PPO | Admitting: Internal Medicine

## 2013-05-13 ENCOUNTER — Other Ambulatory Visit (INDEPENDENT_AMBULATORY_CARE_PROVIDER_SITE_OTHER): Payer: BC Managed Care – PPO

## 2013-05-13 VITALS — BP 179/106 | HR 91 | Temp 99.2°F | Resp 20 | Ht 65.0 in | Wt 117.0 lb

## 2013-05-13 DIAGNOSIS — K3189 Other diseases of stomach and duodenum: Secondary | ICD-10-CM

## 2013-05-13 DIAGNOSIS — R1310 Dysphagia, unspecified: Secondary | ICD-10-CM

## 2013-05-13 DIAGNOSIS — C160 Malignant neoplasm of cardia: Secondary | ICD-10-CM

## 2013-05-13 DIAGNOSIS — K319 Disease of stomach and duodenum, unspecified: Secondary | ICD-10-CM

## 2013-05-13 DIAGNOSIS — K228 Other specified diseases of esophagus: Secondary | ICD-10-CM

## 2013-05-13 DIAGNOSIS — K2289 Other specified disease of esophagus: Secondary | ICD-10-CM

## 2013-05-13 LAB — CBC WITH DIFFERENTIAL/PLATELET
Basophils Absolute: 0 10*3/uL (ref 0.0–0.1)
Basophils Relative: 0.2 % (ref 0.0–3.0)
EOS PCT: 1.2 % (ref 0.0–5.0)
Eosinophils Absolute: 0.2 10*3/uL (ref 0.0–0.7)
HCT: 47.6 % (ref 39.0–52.0)
Hemoglobin: 15.9 g/dL (ref 13.0–17.0)
LYMPHS PCT: 23.3 % (ref 12.0–46.0)
Lymphs Abs: 2.9 10*3/uL (ref 0.7–4.0)
MCHC: 33.5 g/dL (ref 30.0–36.0)
MCV: 91.6 fl (ref 78.0–100.0)
MONO ABS: 0.2 10*3/uL (ref 0.1–1.0)
Monocytes Relative: 1.9 % — ABNORMAL LOW (ref 3.0–12.0)
NEUTROS PCT: 73.4 % (ref 43.0–77.0)
Neutro Abs: 9.2 10*3/uL — ABNORMAL HIGH (ref 1.4–7.7)
PLATELETS: 238 10*3/uL (ref 150.0–400.0)
RBC: 5.2 Mil/uL (ref 4.22–5.81)
RDW: 14 % (ref 11.5–15.5)
WBC: 12.5 10*3/uL — AB (ref 4.0–10.5)

## 2013-05-13 LAB — COMPREHENSIVE METABOLIC PANEL
ALBUMIN: 3.8 g/dL (ref 3.5–5.2)
ALK PHOS: 70 U/L (ref 39–117)
ALT: 37 U/L (ref 0–53)
AST: 34 U/L (ref 0–37)
BUN: 10 mg/dL (ref 6–23)
CO2: 27 mEq/L (ref 19–32)
Calcium: 9.1 mg/dL (ref 8.4–10.5)
Chloride: 103 mEq/L (ref 96–112)
Creatinine, Ser: 1 mg/dL (ref 0.4–1.5)
GFR: 99.24 mL/min (ref >60.00)
GLUCOSE: 88 mg/dL (ref 70–99)
POTASSIUM: 5 meq/L (ref 3.5–5.1)
SODIUM: 138 meq/L (ref 135–145)
TOTAL PROTEIN: 7.4 g/dL (ref 6.0–8.3)
Total Bilirubin: 1.1 mg/dL (ref 0.2–1.2)

## 2013-05-13 MED ORDER — OXYCODONE HCL 5 MG/5ML PO SOLN: 5.0000 mg | ORAL | Status: DC | PRN

## 2013-05-13 MED ORDER — SODIUM CHLORIDE 0.9 % IV SOLN: 500.0000 mL | INTRAVENOUS | Status: DC

## 2013-05-13 NOTE — Progress Notes (Addendum)
Advised patient and family member CT Scan Friday, May 15th at 3:30pm. Also advised to arrive by 2:45pm and nothing to eat or drink past 11:30am. Contrast will be given upon arrival Friday. Directions given. Patient escorted by transporter to lab for blood work before discharge home.

## 2013-05-13 NOTE — Progress Notes (Signed)
Called to room to assist during endoscopic procedure.  Patient ID and intended procedure confirmed with present staff. Received instructions for my participation in the procedure from the performing physician.  

## 2013-05-13 NOTE — Op Note (Addendum)
[place]  [address] [place], [id number]   ENDOSCOPY PROCEDURE REPORT  PATIENT: [name], Devin Gonzalez  MR#: [id number] BIRTHDATE: 1955-08-04 , 70  yrs. old GENDER: Male ENDOSCOPIST: Gatha Mayer, MD, [facility] Regal REFERRED BY:  Domenick Gong, M.D. PROCEDURE DATE:  05/13/2013 PROCEDURE:  Esophagoscopy  + biopsy ASA CLASS:     Class II INDICATIONS:  Dysphagia. MEDICATIONS: Propofol (Diprivan) 140 mg IV, MAC sedation, administered by CRNA, and These medications were titrated to patient response per physician's verbal order TOPICAL ANESTHETIC: none  DESCRIPTION OF PROCEDURE: After the risks benefits and alternatives of the procedure were thoroughly explained, informed consent was obtained.  The LB [id number] endoscope was introduced through the mouth and advanced to the gastroesophageal junction. Limited by an obstruction.  The instrument was slowly withdrawn as the mucosa was fully examined.        ESOPHAGUS: A circumferential ulcerated and firm mass was found at the gastroesophageal junction.  Would not allow scope to pass. Multiple biopsies were performed using cold forceps.  Sample sent for histology.  Retroflexion was not performed.     The scope was then withdrawn from the patient and the procedure completed.  COMPLICATIONS: There were no complications. ENDOSCOPIC IMPRESSION: Circumferential mass was found at the gastroesophageal junction; multiple biopsies - suspect cancer  RECOMMENDATIONS: 1.  Await biopsy results 2.  CBC, CMET, CEA today 3.  CT chest, abdomen, pelvis with contrast to evaluate GE junction mass suspicious for cancer 4.  Will call results/next steps 5.  liquid diet 6.  oxycodone elixer rx   eSigned:  Gatha Mayer, MD, [facility] 05/13/2013 4:36 PM   FK:CLEXNTZ Tisovec, MD and The Patient

## 2013-05-13 NOTE — Progress Notes (Signed)
A/ox3 pleased with MAC, report to Wendy RN 

## 2013-05-13 NOTE — Patient Instructions (Addendum)
There is a mass where the esophagus and stomach meets. Unfortunately this looks like a cancer. I took biopsies and should know results by Thursday and we will call. You need to have CT scans and labs and will need to see other specialists to help you with this - I will arrange.  I am prescribing some pain medication also.  Gatha Mayer, MD, FACG  YOU HAD AN ENDOSCOPIC PROCEDURE TODAY AT [facility]: Refer to the procedure report that was given to you for any specific questions about what was found during the examination.  If the procedure report does not answer your questions, please call your gastroenterologist to clarify.  If you requested that your care partner not be given the details of your procedure findings, then the procedure report has been included in a sealed envelope for you to review at your convenience later.  YOU SHOULD EXPECT: Some feelings of bloating in the abdomen. Passage of more gas than usual.  Walking can help get rid of the air that was put into your GI tract during the procedure and reduce the bloating. If you had a lower endoscopy (such as a colonoscopy or flexible sigmoidoscopy) you may notice spotting of blood in your stool or on the toilet paper. If you underwent a bowel prep for your procedure, then you may not have a normal bowel movement for a few days.  DIET: Your first meal following the procedure should be a light meal and then it is ok to progress to your normal diet.  A half-sandwich or bowl of soup is an example of a good first meal.  Heavy or fried foods are harder to digest and may make you feel nauseous or bloated.  Likewise meals heavy in dairy and vegetables can cause extra gas to form and this can also increase the bloating.  Drink plenty of fluids but you should avoid alcoholic beverages for 24 hours.  ACTIVITY: Your care partner should take you home directly after the procedure.  You should plan to take it easy, moving slowly for the  rest of the day.  You can resume normal activity the day after the procedure however you should NOT DRIVE or use heavy machinery for 24 hours (because of the sedation medicines used during the test).    SYMPTOMS TO REPORT IMMEDIATELY: A gastroenterologist can be reached at any hour.  During normal business hours, 8:30 AM to 5:00 PM Monday through Friday, call [phone].  After hours and on weekends, please call the GI answering service at [phone] who will take a message and have the physician on call contact you.    Following upper endoscopy (EGD)  Vomiting of blood or coffee ground material  New chest pain or pain under the shoulder blades  Painful or persistently difficult swallowing  New shortness of breath  Fever of 100F or higher  Black, tarry-looking stools  FOLLOW UP: If any biopsies were taken you will be contacted by phone or by letter within the next 1-3 weeks.  Call your gastroenterologist if you have not heard about the biopsies in 3 weeks.  Our staff will call the home number listed on your records the next business day following your procedure to check on you and address any questions or concerns that you may have at that time regarding the information given to you following your procedure. This is a courtesy call and so if there is no answer at the home number and we have  not heard from you through the emergency physician on call, we will assume that you have returned to your regular daily activities without incident.  SIGNATURES/CONFIDENTIALITY: You and/or your care partner have signed paperwork which will be entered into your electronic medical record.  These signatures attest to the fact that that the information above on your After Visit Summary has been reviewed and is understood.  Full responsibility of the confidentiality of this discharge information lies with you and/or your care-partner.  Recommendations Await pathology results Lab work today CT  Scan Liquid diet plus nutritional supplements Oxycodone elixer prescription

## 2013-05-14 ENCOUNTER — Telehealth: Payer: Self-pay | Admitting: Internal Medicine

## 2013-05-14 ENCOUNTER — Telehealth: Payer: Self-pay | Admitting: *Deleted

## 2013-05-14 LAB — CEA: CEA: 1.5 ng/mL (ref 0.0–5.0)

## 2013-05-14 NOTE — Telephone Encounter (Signed)
No answer, left message to call if questions or concerns. 

## 2013-05-14 NOTE — Telephone Encounter (Signed)
I told Ganesh that I spoke to [place] at [facility] and they can make the liquid oxy rx he needs.  He is going to drop it off there today before they close at 7pm.  He said that Alka-Seltzer's are the only thing helping his pain but he wanted to know if it's ok to take Sir.

## 2013-05-15 ENCOUNTER — Other Ambulatory Visit: Payer: Self-pay

## 2013-05-15 DIAGNOSIS — C159 Malignant neoplasm of esophagus, unspecified: Secondary | ICD-10-CM

## 2013-05-15 NOTE — Progress Notes (Signed)
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

## 2013-05-15 NOTE — Telephone Encounter (Signed)
Dr. Carlean Purl spoke to patient today and answered his questions regarding the Alka-Seltzer.

## 2013-05-16 ENCOUNTER — Other Ambulatory Visit: Payer: BC Managed Care – PPO

## 2013-05-16 NOTE — Telephone Encounter (Signed)
[facility] are unable to do the liquid oxy, it was a misunderstanding when I called the other day, they have suppositories not liquid.  I have spoken with [facility] and they do have it.  Patient has been notified and he was given the address and phone # for [facility].  He also let us know that the [place] CT machine is broke and they have r/s 'ed him to Monday 05/19/13. Dr. Carlean Purl is aware.

## 2013-05-19 ENCOUNTER — Ambulatory Visit (INDEPENDENT_AMBULATORY_CARE_PROVIDER_SITE_OTHER)
Admission: RE | Admit: 2013-05-19 | Discharge: 2013-05-19 | Disposition: A | Discharge status: Home / Self Care | Payer: BC Managed Care – PPO | Source: Ambulatory Visit | Attending: Internal Medicine | Admitting: Internal Medicine

## 2013-05-19 DIAGNOSIS — K2289 Other specified disease of esophagus: Secondary | ICD-10-CM

## 2013-05-19 DIAGNOSIS — K229 Disease of esophagus, unspecified: Secondary | ICD-10-CM

## 2013-05-19 DIAGNOSIS — K228 Other specified diseases of esophagus: Secondary | ICD-10-CM

## 2013-05-19 MED ORDER — IOHEXOL 300 MG/ML  SOLN
100.0000 mL | Freq: Once | INTRAMUSCULAR | Status: AC | PRN
  Administered 2013-05-19: 100 mL via INTRAVENOUS

## 2013-05-21 ENCOUNTER — Other Ambulatory Visit: Payer: Self-pay | Admitting: Internal Medicine

## 2013-05-21 ENCOUNTER — Ambulatory Visit: Payer: BC Managed Care – PPO

## 2013-05-21 ENCOUNTER — Ambulatory Visit (HOSPITAL_BASED_OUTPATIENT_CLINIC_OR_DEPARTMENT_OTHER): Payer: BC Managed Care – PPO

## 2013-05-21 ENCOUNTER — Encounter: Payer: Self-pay | Admitting: Internal Medicine

## 2013-05-21 ENCOUNTER — Telehealth: Payer: Self-pay | Admitting: Internal Medicine

## 2013-05-21 ENCOUNTER — Ambulatory Visit (HOSPITAL_BASED_OUTPATIENT_CLINIC_OR_DEPARTMENT_OTHER): Payer: BC Managed Care – PPO | Admitting: Internal Medicine

## 2013-05-21 VITALS — BP 168/83 | HR 88 | Temp 98.7°F | Resp 18 | Ht 65.0 in | Wt 116.2 lb

## 2013-05-21 DIAGNOSIS — C155 Malignant neoplasm of lower third of esophagus: Secondary | ICD-10-CM

## 2013-05-21 DIAGNOSIS — F17200 Nicotine dependence, unspecified, uncomplicated: Secondary | ICD-10-CM

## 2013-05-21 DIAGNOSIS — C159 Malignant neoplasm of esophagus, unspecified: Secondary | ICD-10-CM

## 2013-05-21 NOTE — Telephone Encounter (Signed)
gave pt appt for lab and MD , alsdo appt with nutirionist, referral for surgeon is taking care of Cira Rue, RN

## 2013-05-21 NOTE — Progress Notes (Signed)
[place] [phone]   [phone]  NEW PATIENT EVALUATION   Name: Devin Gonzalez Date: 05/21/2013 MRN: [id number] DOB: 06-07-1955  PCP: [name], MD   REFERRING PHYSICIAN: Tisovec, Fransico Him, MD  REASON FOR REFERRAL: Esophageal Adenocarcinoma of GE junction    HISTORY OF PRESENT ILLNESS:Devin Gonzalez is a 58 y.o. male who is being referred for newly diagnosed esophageal adenocarcinoma.   Devin Gonzalez initially was evaluated by Dr. Osborne Casco in one or two week prior to his visit with Dr. Carlean Purl on 05/12/2013.  Devin Gonzalez was having a "acid reflux" burning sensation and it bothered him at night.  Devin Gonzalez also noted feeling as if his foods was feeling stuck.  Due to persistence in these symptoms including odynophagia, reflux and solid dysphagia with weight loss (Devin Gonzalez lost 16 lbs over the past few months), her was referred to Dr. Carlean Purl.  Dr. Carlean Purl scheduled him for an EGD on 05/13/2013.   Of note, Devin Gonzalez had been on PPI Bid since January with minimal improvement in symptoms.  Devin Gonzalez reports a longstanding smoking history for the past 20 plus years.  Devin Gonzalez smokes about 0.5 packs per day.  Devin Gonzalez drinks 3-4 beers per day (every other day) for the past 15 plus years.   Devin Gonzalez had an EGD which showed circumferential mass at the gastroesophageal junction. Multiple biopsies were obtained and it were determined to esophageal adenocarcinoma without amplification of HER-2 detected. Devin Gonzalez had a CT chest and abdomen and pelvis on 05/19/2013, which showed a mass of 4.6 cm along the GE junction. The esophagus is dilated above this mass.  There was a suspected 12 mm lymph node near the GE junction. There was also bulky gastrohepatic ligament adenopathy noted with combined lymph nodes measuring up to 3 cm.  There was also borderdline celiac axis adenopathy all suspicious for metastatic disease.  There finding to suggest hepatic metastatic disease, osseous metastatic disease or lung disease.    Today, Devin Gonzalez  reports the symptoms of burning and solid dysphagia persisting.  Devin Gonzalez has started to eat soft textured foods.  Devin Gonzalez also has started boost.  Devin Gonzalez notes frequent regurgitation and at night increased reflux.  Devin Gonzalez reports being laid off from [employer] about 5 weeks ago.  Devin Gonzalez worked in Loss adjuster, chartered.  Devin Gonzalez reports his last colonoscopy was in 2014 for hemorrhoids.  Devin Gonzalez reports occasional blood on the tissue paper with frequent burning.  Devin Gonzalez denies me lana or hematochezia.   PAST MEDICAL HISTORY:  has a past medical history of Allergy; Hemorrhoids; Back pain; Bunion, right foot; Left tennis elbow; Osteoarthritis; Prostatitis; Diverticulosis (2013); Adenomatous colon polyp; ED (erectile dysfunction); Tobacco abuse; Reflux esophagitis; and Nonspecific elevation of levels of transaminase or lactic acid dehydrogenase (LDH).     PAST SURGICAL HISTORY: Past Surgical History  Procedure Laterality Date  . Cyst on testicle Right   . Appendectomy    . Tonsillectomy    . Knee surgery Bilateral     arthroscopic surg /  bil knees  . Fatty tumor Right     removal forearm  . Colonoscopy w/ polypectomy    . Sphincterotomy  08/28/2011    Procedure: SPHINCTEROTOMY;  Surgeon: Joyice Faster. Cornett, MD;  Location: [place];  Service: General;  Laterality: N/A;  Lateral internal sphincterotomy  . Hemorrhoid surgery  08/28/2011    Procedure: HEMORRHOIDECTOMY;  Surgeon: Joyice Faster. Cornett, MD;  Location: [place];  Service: General;  Laterality: N/A;  ,possible hemorrhoidectomy  . Examination  under anesthesia  12/13/2011    Procedure: EXAM UNDER ANESTHESIA;  Surgeon: Marcello Moores A. Cornett, MD;  Location: [place];  Service: General;  Laterality: N/A;  . Anal fissure repair  2013    Cornett     CURRENT MEDICATIONS: has a current medication list which includes the following prescription(s): ascorbic acid, diphenhydramine hcl, lidocaine, oxycodone, polyethylene glycol powder, tetrahydrozoline hcl, and triamcinolone  ointment.   ALLERGIES: Review of patient's allergies indicates no known allergies.   SOCIAL HISTORY:  reports that Devin Gonzalez has been smoking Cigarettes.  Devin Gonzalez has a 10 pack-year smoking history. Devin Gonzalez has never used smokeless tobacco. Devin Gonzalez reports that Devin Gonzalez drinks about 14 ounces of alcohol per week. Devin Gonzalez reports that Devin Gonzalez does not use illicit drugs.   FAMILY HISTORY: family history includes Lung cancer in his brother and mother.   LABORATORY DATA:  CBC    Component Value Date/Time   WBC 12.5* 05/13/2013 1719   RBC 5.20 05/13/2013 1719   HGB 15.9 05/13/2013 1719   HCT 47.6 05/13/2013 1719   PLT 238.0 05/13/2013 1719   MCV 91.6 05/13/2013 1719   MCH 30.7 12/13/2011 0927   MCHC 33.5 05/13/2013 1719   RDW 14.0 05/13/2013 1719   LYMPHSABS 2.9 05/13/2013 1719   MONOABS 0.2 05/13/2013 1719   EOSABS 0.2 05/13/2013 1719   BASOSABS 0.0 05/13/2013 1719   CMP     Component Value Date/Time   NA 138 05/13/2013 1719   K 5.0 05/13/2013 1719   CL 103 05/13/2013 1719   CO2 27 05/13/2013 1719   GLUCOSE 88 05/13/2013 1719   BUN 10 05/13/2013 1719   CREATININE 1.0 05/13/2013 1719   CALCIUM 9.1 05/13/2013 1719   PROT 7.4 05/13/2013 1719   ALBUMIN 3.8 05/13/2013 1719   AST 34 05/13/2013 1719   ALT 37 05/13/2013 1719   ALKPHOS 70 05/13/2013 1719   BILITOT 1.1 05/13/2013 1719   GFRNONAA >90 12/13/2011 0927   GFRAA >90 12/13/2011 0927   RADIOGRAPHY: Ct Chest W Contrast  05/19/2013   CLINICAL DATA:  Esophageal cancer.  EXAM: CT CHEST, ABDOMEN, AND PELVIS WITH CONTRAST  TECHNIQUE: Multidetector CT imaging of the chest, abdomen and pelvis was performed following the standard protocol during bolus administration of intravenous contrast.  CONTRAST:  100 cc Omnipaque 300  COMPARISON:  None.  FINDINGS: CT CHEST FINDINGS  The chest wall is unremarkable. No supraclavicular or axillary lymphadenopathy. Small scattered lymph nodes are noted. The thyroid gland is grossly normal. The bony thorax is intact. No destructive bone lesions or spinal  canal compromise. Mild degenerative changes involving the spine.  The heart is normal in size. No pericardial effusion. There is a long segment distal esophageal mass extending right to the GE junction. The mass is approximately the 4.6 cm along. The esophagus is dilated above this mass. There is a 16 mm irregular aorticopulmonary window lymph node which is likely metastatic. There is also a suspected 12 mm lymph node near the GE junction. Bulky gastrohepatic ligament adenopathy is noted with combined lymph nodes measuring up to 3 cm. Smaller periportal and celiac axis lymph nodes are indeterminate but worrisome. The aorta is normal in caliber. No dissection. Moderate atherosclerotic calcifications, advanced for age.  Examination of the lung parenchyma demonstrates upper lobe emphysematous changes. No worrisome pulmonary nodules to suggest pulmonary metastatic disease. No acute pulmonary findings. No pleural effusion. A small Bochdalek's hernia is noted.  CT ABDOMEN AND PELVIS FINDINGS  No findings for hepatic metastatic disease. No biliary dilatation. The  gallbladder is normal. No common bile duct dilatation. The pancreas is normal. The spleen is normal. The adrenal glands and kidneys are normal.  There are enlarged gastrohepatic ligament lymph nodes and borderline periportal and celiac axis lymph nodes worrisome for metastatic disease. Small scattered retroperitoneal lymph nodes are likely benign. Advanced aortic calcifications for age but no focal aneurysm or dissection. The major branch vessels are patent. The major venous structures are patent.  The stomach, duodenum, small bowel and colon are grossly no. No inflammatory changes, mass lesions or obstructive findings. The bladder, prostate gland and seminal vesicles are unremarkable. No inguinal mass or adenopathy.  The bony structures are unremarkable. No findings for osseous metastatic disease.  IMPRESSION: 1. Large distal esophageal mass consistent with  endoscopic findings. The least partial obstruction of the esophagus is noted. 2. Enlarged aorticopulmonary window lymph node, GE junction lymph node, gastrohepatic ligament adenopathy and borderline celiac axis adenopathy all suspicious for metastatic disease. PET-CT may be helpful for more accurate staging. 3. No findings to suggest hepatic metastatic disease, osseous metastatic disease or pulmonary metastatic disease. 4. Advanced atherosclerotic calcifications for age.   Electronically Signed   By: Kalman Jewels M.D.   On: 05/19/2013 16:07   Ct Abdomen Pelvis W Contrast  05/19/2013   CLINICAL DATA:  Esophageal cancer.  EXAM: CT CHEST, ABDOMEN, AND PELVIS WITH CONTRAST  TECHNIQUE: Multidetector CT imaging of the chest, abdomen and pelvis was performed following the standard protocol during bolus administration of intravenous contrast.  CONTRAST:  100 cc Omnipaque 300  COMPARISON:  None.  FINDINGS: CT CHEST FINDINGS  The chest wall is unremarkable. No supraclavicular or axillary lymphadenopathy. Small scattered lymph nodes are noted. The thyroid gland is grossly normal. The bony thorax is intact. No destructive bone lesions or spinal canal compromise. Mild degenerative changes involving the spine.  The heart is normal in size. No pericardial effusion. There is a long segment distal esophageal mass extending right to the GE junction. The mass is approximately the 4.6 cm along. The esophagus is dilated above this mass. There is a 16 mm irregular aorticopulmonary window lymph node which is likely metastatic. There is also a suspected 12 mm lymph node near the GE junction. Bulky gastrohepatic ligament adenopathy is noted with combined lymph nodes measuring up to 3 cm. Smaller periportal and celiac axis lymph nodes are indeterminate but worrisome. The aorta is normal in caliber. No dissection. Moderate atherosclerotic calcifications, advanced for age.  Examination of the lung parenchyma demonstrates upper lobe  emphysematous changes. No worrisome pulmonary nodules to suggest pulmonary metastatic disease. No acute pulmonary findings. No pleural effusion. A small Bochdalek's hernia is noted.  CT ABDOMEN AND PELVIS FINDINGS  No findings for hepatic metastatic disease. No biliary dilatation. The gallbladder is normal. No common bile duct dilatation. The pancreas is normal. The spleen is normal. The adrenal glands and kidneys are normal.  There are enlarged gastrohepatic ligament lymph nodes and borderline periportal and celiac axis lymph nodes worrisome for metastatic disease. Small scattered retroperitoneal lymph nodes are likely benign. Advanced aortic calcifications for age but no focal aneurysm or dissection. The major branch vessels are patent. The major venous structures are patent.  The stomach, duodenum, small bowel and colon are grossly no. No inflammatory changes, mass lesions or obstructive findings. The bladder, prostate gland and seminal vesicles are unremarkable. No inguinal mass or adenopathy.  The bony structures are unremarkable. No findings for osseous metastatic disease.  IMPRESSION: 1. Large distal esophageal mass consistent with endoscopic  findings. The least partial obstruction of the esophagus is noted. 2. Enlarged aorticopulmonary window lymph node, GE junction lymph node, gastrohepatic ligament adenopathy and borderline celiac axis adenopathy all suspicious for metastatic disease. PET-CT may be helpful for more accurate staging. 3. No findings to suggest hepatic metastatic disease, osseous metastatic disease or pulmonary metastatic disease. 4. Advanced atherosclerotic calcifications for age.   Electronically Signed   By: Kalman Jewels M.D.   On: 05/19/2013 16:07       REVIEW OF SYSTEMS:  Constitutional: Denies fevers, chills or abnormal weight loss Eyes: Denies blurriness of vision Ears, nose, mouth, throat, and face: Denies mucositis or sore throat Respiratory: Denies cough, dyspnea or  wheezes Cardiovascular: Denies palpitation, chest discomfort or lower extremity swelling Gastrointestinal:  Denies nausea, heartburn or change in bowel habits Skin: Denies abnormal skin rashes Lymphatics: Denies new lymphadenopathy or easy bruising Neurological:Denies numbness, tingling or new weaknesses Behavioral/Psych: Mood is stable, no new changes  All other systems were reviewed with the patient and are negative.  PHYSICAL EXAM:  height is 5' 5"  (1.651 m) and weight is 116 lb 3.2 oz (52.708 kg). His oral temperature is 98.7 F (37.1 C). His blood pressure is 168/83 and his pulse is 88. His respiration is 18.    GENERAL:alert, no distress and comfortable; well developed SKIN: skin color, texture, turgor are normal, no rashes or significant lesions EYES: normal, Conjunctiva are pink and non-injected, sclera clear OROPHARYNX:no exudate, no erythema and lips, buccal mucosa, and tongue normal; Cannot visualize the uvula.  NECK: supple, thyroid normal size, non-tender, without nodularity LYMPH:  no palpable lymphadenopathy in the cervical, axillary or inguinal LUNGS: clear to auscultation and percussion with normal breathing effort HEART: regular rate & rhythm and no murmurs and no lower extremity edema ABDOMEN:abdomen soft, non-tender and normal bowel sounds Musculoskeletal:no cyanosis of digits and no clubbing  NEURO: alert & oriented x 3 with fluent speech, no focal motor/sensory deficits   IMPRESSION: Devin Gonzalez is a 58 y.o. male with a history of   PLAN:  1.  Newly diagnosed locally advanced esophageal adenocarcinoma (GE junction) by imaging --We reviewed his scans and pathology extensively consistent with the above.  CT of chest and abdomen showed a long segment distal esophageal mass extending right to the GE junction that was approximately the 4.6 cm along with enlarged aorticopulmonary window lymph node, GE junction lymph node, gastrohepatic ligament adenopathy and  borderline celiac axis adenopathy all suspicious for metastatic disease likely Stage III.    --We will obtain a PET to see which nodes express FDG avidity and to determine if preoperative chemotherapy or chemoradiation prior to esophagectomy or definitive chemoradiation (given his good functional status) is appropriate.  We will add his name for next week's tumor conference and referral to surgery and radiation oncology has been made. His Her2 was negative.   --Devin Gonzalez was referred to nutrition given his weight lost secondary to above. Devin Gonzalez will continue his ensure/boost.  2. Tobacco Abuse. --Referral to tobacco cessation classes were made.   3. Follow up. --Patient will follow up in 2 weeks with discussion of PET, and recommendations for next treatment plan based on multidisciplinary evaluation.   All questions were answered. The patient knows to call the clinic with any problems, questions or concerns. We can certainly see the patient much sooner if necessary.  I spent 40 minutes counseling the patient face to face. The total time spent in the appointment was 60 minutes.    Concha Norway,  MD 05/21/2013 11:33 AM

## 2013-05-21 NOTE — Progress Notes (Signed)
Checked in new pt with no financial concerns. °

## 2013-05-21 NOTE — [facility] Oncology Navigator Note (Signed)
Met with Devin Gonzalez and family. Explained role of nurse navigator. Educational information provided on esophageal cancer  Referral made to dietician for diet education. [place] resources provided to patient, including SW service and support group information.  Contact names and phone numbers were provided for entire [facility] team.  Teach back method was used.  Appointment for PET scan is pending.  Spoke with Ebony Hail at [place] who will discuss appointment timeframe with Dr. Servando Snare.   Will continue to follow as needed.

## 2013-05-22 ENCOUNTER — Telehealth: Payer: Self-pay | Admitting: *Deleted

## 2013-05-22 NOTE — Telephone Encounter (Signed)
Spoke with patient and answered questions re: his visit with Oncology on 05/22/13.  Reviewed his upcoming appointments, including PET scan, rad. Onc. , med. Onc., and Psychologist, sport and exercise.  He verbalized understanding.  Will continue to follow.

## 2013-05-28 ENCOUNTER — Encounter: Payer: Self-pay | Admitting: Radiation Oncology

## 2013-05-28 NOTE — Progress Notes (Addendum)
GI Location of Tumor / Histology: Esophageal Adenocarcinoma of GE Junction  Patient presention: He initially was evaluated by Dr. Osborne Gonzalez in one or two week prior to his visit with Dr. Carlean Gonzalez on 05/12/2013. He was having a "acid reflux" burning sensation and it bothered him at night. He also noted feeling as if his foods was feeling stuck. Due to persistence in these symptoms including odynophagia, reflux and solid dysphagia with weight loss (he lost 16 lbs over the past few months), her was referred to Dr. Carlean Gonzalez. Dr. Carlean Gonzalez scheduled him for an EGD on 05/13/2013. Of note, he had been on PPI Bid since January with minimal improvement in symptoms.  Multiple biopsies were obtained and it were determined to esophageal adenocarcinoma without amplification of HER-2 detected. He had a CT chest and abdomen and pelvis on 05/19/2013, which showed a mass of 4.6 cm along the GE junction. The esophagus is dilated above this mass. There was a suspected 12 mm lymph node near the GE junction. There was also bulky gastrohepatic ligament adenopathy noted with combined lymph nodes measuring up to 3 cm. There was also borderdline celiac axis adenopathy all suspicious for metastatic disease. There finding to suggest hepatic metastatic disease, osseous metastatic disease or lung disease.   Biopsy of the Esophagus" 05/13/13 Diagnosis Gastroesophageal, biopsy, ge junction, biopsy - ADENOCARCINOMA.  Past/Anticipated interventions by surgeon, if any: Biopsy of Esophagus- Dr. Carlean Gonzalez  Past/Anticipated interventions by medical oncology, if any: Dr Devin Gonzalez- He will proceed with chemotherapy on 06/11/2013 unless XRT is required with the following regimen:  --Epirubicin 50 mg per meter squared IV on day one  --Cisplatin 60 mg per meter squared IV on day one  --Fluorouracil 200 mg per meter squared IV continuous infusion over 24 hours daily on days 1 through 21  We will plan on 6-8 cycles of treatment or unacceptable  toxicity.  Weight changes, if any: As of 05/21/13 had" lost 16 lbs over the past few months."  Bowel/Bladder complaints, if any: BMs are irregular, will start Miralax  Nausea / Vomiting, if any: occasionally he has n/v when he drinks smoothies ot thick drinks  Pain issues, if any:  symptoms of burning and solid dysphagia persisting. He was eating soft textured foods. He notes frequent regurgitation and at night increased reflux. Currently only drinking smoothies, pureed foods, Boost Plus. Has seen nutritionist. Reports HA , blurred vision x 3 weeks.   SAFETY ISSUES:  Prior radiation? No  Pacemaker/ICD? No  Possible current pregnancy? N/A  Is the patient on methotrexate? NO  Current Complaints / other details:   Laid off from [employer] x 2 months, single, will file for disability. Pt has seen Devin Gonzalez, SW and has papers for Living Will.

## 2013-05-29 ENCOUNTER — Encounter: Payer: Self-pay | Admitting: Internal Medicine

## 2013-05-29 ENCOUNTER — Other Ambulatory Visit: Payer: Self-pay | Admitting: Internal Medicine

## 2013-06-03 ENCOUNTER — Ambulatory Visit (HOSPITAL_COMMUNITY)
Admission: RE | Admit: 2013-06-03 | Discharge: 2013-06-03 | Disposition: A | Discharge status: Home / Self Care | Payer: BC Managed Care – PPO | Source: Ambulatory Visit | Attending: Internal Medicine | Admitting: Internal Medicine

## 2013-06-03 DIAGNOSIS — C155 Malignant neoplasm of lower third of esophagus: Secondary | ICD-10-CM

## 2013-06-03 DIAGNOSIS — R599 Enlarged lymph nodes, unspecified: Secondary | ICD-10-CM | POA: Insufficient documentation

## 2013-06-03 DIAGNOSIS — C159 Malignant neoplasm of esophagus, unspecified: Secondary | ICD-10-CM | POA: Insufficient documentation

## 2013-06-03 DIAGNOSIS — J438 Other emphysema: Secondary | ICD-10-CM | POA: Insufficient documentation

## 2013-06-03 LAB — GLUCOSE, CAPILLARY: GLUCOSE-CAPILLARY: 96 mg/dL (ref 70–99)

## 2013-06-03 MED ORDER — FLUDEOXYGLUCOSE F - 18 (FDG) INJECTION
6.7000 | Freq: Once | INTRAVENOUS | Status: AC | PRN
  Administered 2013-06-03: 6.7 via INTRAVENOUS

## 2013-06-04 ENCOUNTER — Other Ambulatory Visit (HOSPITAL_BASED_OUTPATIENT_CLINIC_OR_DEPARTMENT_OTHER): Payer: BC Managed Care – PPO

## 2013-06-04 ENCOUNTER — Encounter: Payer: Self-pay | Admitting: *Deleted

## 2013-06-04 ENCOUNTER — Ambulatory Visit (HOSPITAL_BASED_OUTPATIENT_CLINIC_OR_DEPARTMENT_OTHER): Payer: BC Managed Care – PPO | Admitting: Internal Medicine

## 2013-06-04 ENCOUNTER — Other Ambulatory Visit: Payer: Self-pay | Admitting: Internal Medicine

## 2013-06-04 ENCOUNTER — Telehealth: Payer: Self-pay | Admitting: Internal Medicine

## 2013-06-04 ENCOUNTER — Ambulatory Visit: Payer: BC Managed Care – PPO | Admitting: Nutrition

## 2013-06-04 VITALS — BP 170/93 | HR 106 | Temp 98.3°F | Resp 18 | Ht 65.0 in | Wt 116.6 lb

## 2013-06-04 DIAGNOSIS — C155 Malignant neoplasm of lower third of esophagus: Secondary | ICD-10-CM

## 2013-06-04 DIAGNOSIS — F17200 Nicotine dependence, unspecified, uncomplicated: Secondary | ICD-10-CM

## 2013-06-04 DIAGNOSIS — Z720 Tobacco use: Secondary | ICD-10-CM

## 2013-06-04 DIAGNOSIS — R634 Abnormal weight loss: Secondary | ICD-10-CM

## 2013-06-04 DIAGNOSIS — C7952 Secondary malignant neoplasm of bone marrow: Secondary | ICD-10-CM

## 2013-06-04 DIAGNOSIS — R1310 Dysphagia, unspecified: Secondary | ICD-10-CM

## 2013-06-04 DIAGNOSIS — C7951 Secondary malignant neoplasm of bone: Secondary | ICD-10-CM

## 2013-06-04 LAB — CBC WITH DIFFERENTIAL/PLATELET
BASO%: 0.5 % (ref 0.0–2.0)
BASOS ABS: 0 10*3/uL (ref 0.0–0.1)
EOS%: 2.4 % (ref 0.0–7.0)
Eosinophils Absolute: 0.2 10*3/uL (ref 0.0–0.5)
HCT: 47.7 % (ref 38.4–49.9)
HEMOGLOBIN: 16.1 g/dL (ref 13.0–17.1)
LYMPH#: 2.4 10*3/uL (ref 0.9–3.3)
LYMPH%: 26 % (ref 14.0–49.0)
MCH: 30.6 pg (ref 27.2–33.4)
MCHC: 33.7 g/dL (ref 32.0–36.0)
MCV: 90.7 fL (ref 79.3–98.0)
MONO#: 1.1 10*3/uL — AB (ref 0.1–0.9)
MONO%: 11.7 % (ref 0.0–14.0)
NEUT%: 59.4 % (ref 39.0–75.0)
NEUTROS ABS: 5.5 10*3/uL (ref 1.5–6.5)
Platelets: 280 10*3/uL (ref 140–400)
RBC: 5.26 10*6/uL (ref 4.20–5.82)
RDW: 13.9 % (ref 11.0–14.6)
WBC: 9.3 10*3/uL (ref 4.0–10.3)

## 2013-06-04 LAB — COMPREHENSIVE METABOLIC PANEL (CC13)
ALK PHOS: 74 U/L (ref 40–150)
ALT: 37 U/L (ref 0–55)
AST: 38 U/L — ABNORMAL HIGH (ref 5–34)
Albumin: 3.6 g/dL (ref 3.5–5.0)
Anion Gap: 16 mEq/L — ABNORMAL HIGH (ref 3–11)
BUN: 6.6 mg/dL — ABNORMAL LOW (ref 7.0–26.0)
CALCIUM: 9.4 mg/dL (ref 8.4–10.4)
CO2: 20 mEq/L — ABNORMAL LOW (ref 22–29)
Chloride: 108 mEq/L (ref 98–109)
Creatinine: 0.8 mg/dL (ref 0.7–1.3)
Glucose: 105 mg/dl (ref 70–140)
POTASSIUM: 4 meq/L (ref 3.5–5.1)
SODIUM: 143 meq/L (ref 136–145)
TOTAL PROTEIN: 7.5 g/dL (ref 6.4–8.3)
Total Bilirubin: 0.52 mg/dL (ref 0.20–1.20)

## 2013-06-04 LAB — LACTATE DEHYDROGENASE (CC13): LDH: 161 U/L (ref 125–245)

## 2013-06-04 MED ORDER — DEXAMETHASONE 4 MG PO TABS: ORAL_TABLET | ORAL | Status: DC

## 2013-06-04 MED ORDER — PROCHLORPERAZINE MALEATE 10 MG PO TABS: 10.0000 mg | ORAL_TABLET | Freq: Four times a day (QID) | ORAL | Status: DC | PRN

## 2013-06-04 MED ORDER — LORAZEPAM 0.5 MG PO TABS: 0.5000 mg | ORAL_TABLET | Freq: Four times a day (QID) | ORAL | Status: DC | PRN

## 2013-06-04 MED ORDER — ONDANSETRON HCL 8 MG PO TABS: 8.0000 mg | ORAL_TABLET | Freq: Two times a day (BID) | ORAL | Status: DC | PRN

## 2013-06-04 NOTE — Patient Instructions (Signed)
Implanted Port Home Guide An implanted port is a type of central line that is placed under the skin. Central lines are used to provide IV access when treatment or nutrition needs to be given through a person's veins. Implanted ports are used for long-term IV access. An implanted port may be placed because:   You need IV medicine that would be irritating to the small veins in your hands or arms.   You need long-term IV medicines, such as antibiotics.   You need IV nutrition for a long period.   You need frequent blood draws for lab tests.   You need dialysis.  Implanted ports are usually placed in the chest area, but they can also be placed in the upper arm, the abdomen, or the leg. An implanted port has two main parts:   Reservoir. The reservoir is round and will appear as a small, raised area under your skin. The reservoir is the part where a needle is inserted to give medicines or draw blood.   Catheter. The catheter is a thin, flexible tube that extends from the reservoir. The catheter is placed into a large vein. Medicine that is inserted into the reservoir goes into the catheter and then into the vein.  HOW WILL I CARE FOR MY INCISION SITE? Do not get the incision site wet. Bathe or shower as directed by your health care provider.  HOW IS MY PORT ACCESSED? Special steps must be taken to access the port:   Before the port is accessed, a numbing cream can be placed on the skin. This helps numb the skin over the port site.   Your health care provider uses a sterile technique to access the port.  Your health care provider must put on a mask and sterile gloves.  The skin over your port is cleaned carefully with an antiseptic and allowed to dry.  The port is gently pinched between sterile gloves, and a needle is inserted into the port.  Only "non-coring" port needles should be used to access the port. Once the port is accessed, a blood return should be checked. This helps  ensure that the port is in the vein and is not clogged.   If your port needs to remain accessed for a constant infusion, a clear (transparent) bandage will be placed over the needle site. The bandage and needle will need to be changed every week, or as directed by your health care provider.   Keep the bandage covering the needle clean and dry. Do not get it wet. Follow your health care provider's instructions on how to take a shower or bath while the port is accessed.   If your port does not need to stay accessed, no bandage is needed over the port.  WHAT IS FLUSHING? Flushing helps keep the port from getting clogged. Follow your health care provider's instructions on how and when to flush the port. Ports are usually flushed with saline solution or a medicine called heparin. The need for flushing will depend on how the port is used.   If the port is used for intermittent medicines or blood draws, the port will need to be flushed:   After medicines have been given.   After blood has been drawn.   As part of routine maintenance.   If a constant infusion is running, the port may not need to be flushed.  HOW LONG WILL MY PORT STAY IMPLANTED? The port can stay in for as long as your health care   provider thinks it is needed. When it is time for the port to come out, surgery will be done to remove it. The procedure is similar to the one performed when the port was put in.  WHEN SHOULD I SEEK IMMEDIATE MEDICAL CARE? When you have an implanted port, you should seek immediate medical care if:   You notice a bad smell coming from the incision site.   You have swelling, redness, or drainage at the incision site.   You have more swelling or pain at the port site or the surrounding area.   You have a fever that is not controlled with medicine. Document Released: 12/19/2004 Document Revised: 10/09/2012 Document Reviewed: 08/26/2012 [facility] Patient Information 2014 [place]. Fluorouracil, 5-FU injection What is this medicine? FLUOROURACIL, 5-FU (flure oh YOOR a sil) is a chemotherapy drug. It slows the growth of cancer cells. This medicine is used to treat many types of cancer like breast cancer, colon or rectal cancer, pancreatic cancer, and stomach cancer. This medicine may be used for other purposes; ask your health care provider or pharmacist if you have questions. COMMON BRAND NAME(S): Adrucil What should I tell my health care provider before I take this medicine? They need to know if you have any of these conditions: -blood disorders -dihydropyrimidine dehydrogenase (DPD) deficiency -infection (especially a virus infection such as chickenpox, cold sores, or herpes) -kidney disease -liver disease -malnourished, poor nutrition -recent or ongoing radiation therapy -an unusual or allergic reaction to fluorouracil, other chemotherapy, other medicines, foods, dyes, or preservatives -pregnant or trying to get pregnant -breast-feeding How should I use this medicine? This drug is given as an infusion or injection into a vein. It is administered in a hospital or clinic by a specially trained health care professional. Talk to your pediatrician regarding the use of this medicine in children. Special care may be needed. Overdosage: If you think you have taken too much of this medicine contact a poison control center or emergency room at once. NOTE: This medicine is only for you. Do not share this medicine with others. What if I miss a dose? It is important not to miss your dose. Call your doctor or health care professional if you are unable to keep an appointment. What may interact with this medicine? -allopurinol -cimetidine -dapsone -digoxin -hydroxyurea -leucovorin -levamisole -medicines for seizures like ethotoin, fosphenytoin, phenytoin -medicines to increase blood counts like filgrastim, pegfilgrastim, sargramostim -medicines that treat or prevent  blood clots like warfarin, enoxaparin, and dalteparin -methotrexate -metronidazole -pyrimethamine -some other chemotherapy drugs like busulfan, cisplatin, estramustine, vinblastine -trimethoprim -trimetrexate -vaccines Talk to your doctor or health care professional before taking any of these medicines: -acetaminophen -aspirin -ibuprofen -ketoprofen -naproxen This list may not describe all possible interactions. Give your health care provider a list of all the medicines, herbs, non-prescription drugs, or dietary supplements you use. Also tell them if you smoke, drink alcohol, or use illegal drugs. Some items may interact with your medicine. What should I watch for while using this medicine? Visit your doctor for checks on your progress. This drug may make you feel generally unwell. This is not uncommon, as chemotherapy can affect healthy cells as well as cancer cells. Report any side effects. Continue your course of treatment even though you feel ill unless your doctor tells you to stop. In some cases, you may be given additional medicines to help with side effects. Follow all directions for their use. Call your doctor or health care professional for advice if you  get a fever, chills or sore throat, or other symptoms of a cold or flu. Do not treat yourself. This drug decreases your body's ability to fight infections. Try to avoid being around people who are sick. This medicine may increase your risk to bruise or bleed. Call your doctor or health care professional if you notice any unusual bleeding. Be careful brushing and flossing your teeth or using a toothpick because you may get an infection or bleed more easily. If you have any dental work done, tell your dentist you are receiving this medicine. Avoid taking products that contain aspirin, acetaminophen, ibuprofen, naproxen, or ketoprofen unless instructed by your doctor. These medicines may hide a fever. Do not become pregnant while taking  this medicine. Women should inform their doctor if they wish to become pregnant or think they might be pregnant. There is a potential for serious side effects to an unborn child. Talk to your health care professional or pharmacist for more information. Do not breast-feed an infant while taking this medicine. Men should inform their doctor if they wish to father a child. This medicine may lower sperm counts. Do not treat diarrhea with over the counter products. Contact your doctor if you have diarrhea that lasts more than 2 days or if it is severe and watery. This medicine can make you more sensitive to the sun. Keep out of the sun. If you cannot avoid being in the sun, wear protective clothing and use sunscreen. Do not use sun lamps or tanning beds/booths. What side effects may I notice from receiving this medicine? Side effects that you should report to your doctor or health care professional as soon as possible: -allergic reactions like skin rash, itching or hives, swelling of the face, lips, or tongue -low blood counts - this medicine may decrease the number of white blood cells, red blood cells and platelets. You may be at increased risk for infections and bleeding. -signs of infection - fever or chills, cough, sore throat, pain or difficulty passing urine -signs of decreased platelets or bleeding - bruising, pinpoint red spots on the skin, black, tarry stools, blood in the urine -signs of decreased red blood cells - unusually weak or tired, fainting spells, lightheadedness -breathing problems -changes in vision -chest pain -mouth sores -nausea and vomiting -pain, swelling, redness at site where injected -pain, tingling, numbness in the hands or feet -redness, swelling, or sores on hands or feet -stomach pain -unusual bleeding Side effects that usually do not require medical attention (report to your doctor or health care professional if they continue or are bothersome): -changes in finger  or toe nails -diarrhea -dry or itchy skin -hair loss -headache -loss of appetite -sensitivity of eyes to the light -stomach upset -unusually teary eyes This list may not describe all possible side effects. Call your doctor for medical advice about side effects. You may report side effects to FDA at 1-800-FDA-1088. Where should I keep my medicine? This drug is given in a hospital or clinic and will not be stored at home. NOTE: This sheet is a summary. It may not cover all possible information. If you have questions about this medicine, talk to your doctor, pharmacist, or health care provider.  2014, [employer]. (2007-04-24 13:53:16) Cisplatin injection What is this medicine? CISPLATIN (SIS pla tin) is a chemotherapy drug. It targets fast dividing cells, like cancer cells, and causes these cells to die. This medicine is used to treat many types of cancer like bladder, ovarian, and testicular cancers.  This medicine may be used for other purposes; ask your health care provider or pharmacist if you have questions. COMMON BRAND NAME(S): Platinol -AQ, Platinol What should I tell my health care provider before I take this medicine? They need to know if you have any of these conditions: -blood disorders -hearing problems -kidney disease -recent or ongoing radiation therapy -an unusual or allergic reaction to cisplatin, carboplatin, other chemotherapy, other medicines, foods, dyes, or preservatives -pregnant or trying to get pregnant -breast-feeding How should I use this medicine? This drug is given as an infusion into a vein. It is administered in a hospital or clinic by a specially trained health care professional. Talk to your pediatrician regarding the use of this medicine in children. Special care may be needed. Overdosage: If you think you have taken too much of this medicine contact a poison control center or emergency room at once. NOTE: This medicine is only for you. Do  not share this medicine with others. What if I miss a dose? It is important not to miss a dose. Call your doctor or health care professional if you are unable to keep an appointment. What may interact with this medicine? -dofetilide -foscarnet -medicines for seizures -medicines to increase blood counts like filgrastim, pegfilgrastim, sargramostim -probenecid -pyridoxine used with altretamine -rituximab -some antibiotics like amikacin, gentamicin, neomycin, polymyxin B, streptomycin, tobramycin -sulfinpyrazone -vaccines -zalcitabine Talk to your doctor or health care professional before taking any of these medicines: -acetaminophen -aspirin -ibuprofen -ketoprofen -naproxen This list may not describe all possible interactions. Give your health care provider a list of all the medicines, herbs, non-prescription drugs, or dietary supplements you use. Also tell them if you smoke, drink alcohol, or use illegal drugs. Some items may interact with your medicine. What should I watch for while using this medicine? Your condition will be monitored carefully while you are receiving this medicine. You will need important blood work done while you are taking this medicine. This drug may make you feel generally unwell. This is not uncommon, as chemotherapy can affect healthy cells as well as cancer cells. Report any side effects. Continue your course of treatment even though you feel ill unless your doctor tells you to stop. In some cases, you may be given additional medicines to help with side effects. Follow all directions for their use. Call your doctor or health care professional for advice if you get a fever, chills or sore throat, or other symptoms of a cold or flu. Do not treat yourself. This drug decreases your body's ability to fight infections. Try to avoid being around people who are sick. This medicine may increase your risk to bruise or bleed. Call your doctor or health care professional if  you notice any unusual bleeding. Be careful brushing and flossing your teeth or using a toothpick because you may get an infection or bleed more easily. If you have any dental work done, tell your dentist you are receiving this medicine. Avoid taking products that contain aspirin, acetaminophen, ibuprofen, naproxen, or ketoprofen unless instructed by your doctor. These medicines may hide a fever. Do not become pregnant while taking this medicine. Women should inform their doctor if they wish to become pregnant or think they might be pregnant. There is a potential for serious side effects to an unborn child. Talk to your health care professional or pharmacist for more information. Do not breast-feed an infant while taking this medicine. Drink fluids as directed while you are taking this medicine. This will help protect  your kidneys. Call your doctor or health care professional if you get diarrhea. Do not treat yourself. What side effects may I notice from receiving this medicine? Side effects that you should report to your doctor or health care professional as soon as possible: -allergic reactions like skin rash, itching or hives, swelling of the face, lips, or tongue -signs of infection - fever or chills, cough, sore throat, pain or difficulty passing urine -signs of decreased platelets or bleeding - bruising, pinpoint red spots on the skin, black, tarry stools, nosebleeds -signs of decreased red blood cells - unusually weak or tired, fainting spells, lightheadedness -breathing problems -changes in hearing -gout pain -low blood counts - This drug may decrease the number of white blood cells, red blood cells and platelets. You may be at increased risk for infections and bleeding. -nausea and vomiting -pain, swelling, redness or irritation at the injection site -pain, tingling, numbness in the hands or feet -problems with balance, movement -trouble passing urine or change in the amount of  urine Side effects that usually do not require medical attention (report to your doctor or health care professional if they continue or are bothersome): -changes in vision -loss of appetite -metallic taste in the mouth or changes in taste This list may not describe all possible side effects. Call your doctor for medical advice about side effects. You may report side effects to FDA at 1-800-FDA-1088. Where should I keep my medicine? This drug is given in a hospital or clinic and will not be stored at home. NOTE: This sheet is a summary. It may not cover all possible information. If you have questions about this medicine, talk to your doctor, pharmacist, or health care provider.  2014, [employer] Standard. (2007-03-26 14:40:54) Epirubicin injection What is this medicine? EPIRUBICIN (ep i ROO bi sin) is a chemotherapy drug. This medicine is used to treat breast cancer. This medicine may be used for other purposes; ask your health care provider or pharmacist if you have questions. COMMON BRAND NAME(S): Ellence What should I tell my health care provider before I take this medicine? They need to know if you have any of these conditions: -blood disorders -heart disease, recent heart attack -infection (especially a virus infection such as chickenpox, cold sores, or herpes) -irregular heartbeat -kidney disease -liver disease -recent or ongoing radiation therapy -an unusual or allergic reaction to epirubicin, other chemotherapy agents, other medicines, foods, dyes, or preservatives -pregnant or trying to get pregnant -breast-feeding How should I use this medicine? This drug is given as an infusion into a vein. It is administered in a hospital or clinic by a specially trained health care professional. If you have pain, swelling, burning or any unusual feeling around the site of your injection, tell your health care professional right away. Talk to your pediatrician regarding the use of this  medicine in children. Special care may be needed. Overdosage: If you think you have taken too much of this medicine contact a poison control center or emergency room at once. NOTE: This medicine is only for you. Do not share this medicine with others. What if I miss a dose? It is important not to miss your dose. Call your doctor or health care professional if you are unable to keep an appointment. What may interact with this medicine? Do not take this medicine with any of the following medications: -cisapride -droperidol -halofantrine -pimozide This medicine may also interact with the following medications: -chloroquine -chlorpromazine -clarithromycin -cimetidine -cyclosporine -erythromycin -medicines for blood pressure  like amlodipine, felodipine, nifedipine -medicines for depression, anxiety, or psychotic disturbances -medicines for irregular heart beat like amiodarone, bepridil, dofetilide, encainide, flecainide, propafenone, quinidine -medicines for nausea, vomiting like dolasetron, ondansetron, palonosetron -medicines to increase blood counts like filgrastim, pegfilgrastim, sargramostim -methadone -methotrexate -pentamidine -vaccines Talk to your doctor or health care professional before taking any of these medicines: -acetaminophen -aspirin -ibuprofen -ketoprofen -naproxen This list may not describe all possible interactions. Give your health care provider a list of all the medicines, herbs, non-prescription drugs, or dietary supplements you use. Also tell them if you smoke, drink alcohol, or use illegal drugs. Some items may interact with your medicine. What should I watch for while using this medicine? Your condition will be monitored carefully while you are receiving this medicine. You will need important blood work done while you are taking this medicine. This drug may make you feel generally unwell. This is not uncommon, as chemotherapy can affect healthy cells as well  as cancer cells. Report any side effects. Continue your course of treatment even though you feel ill unless your doctor tells you to stop. Your urine may turn red for a few days after your dose. This is not blood. If your urine is dark or brown, call your doctor. In some cases, you may be given additional medicines to help with side effects. Follow all directions for their use. Call your doctor or health care professional for advice if you get a fever, chills or sore throat, or other symptoms of a cold or flu. Do not treat yourself. This drug decreases your body's ability to fight infections. Try to avoid being around people who are sick. This medicine may increase your risk to bruise or bleed. Call your doctor or health care professional if you notice any unusual bleeding. Be careful brushing and flossing your teeth or using a toothpick because you may get an infection or bleed more easily. If you have any dental work done, tell your dentist you are receiving this medicine. Avoid taking products that contain aspirin, acetaminophen, ibuprofen, naproxen, or ketoprofen unless instructed by your doctor. These medicines may hide a fever. Men and women of childbearing age should use effective birth control methods while using taking this medicine. Do not become pregnant while taking this medicine. There is a potential for serious side effects to an unborn child. Talk to your health care professional or pharmacist for more information. Do not breast-feed an infant while taking this medicine. There is a maximum amount of this medicine you should receive throughout your life. The amount depends on the medical condition being treated and your overall health. Your doctor will watch how much of this medicine you receive in your lifetime. Tell your doctor if you have taken this medicine before. What side effects may I notice from receiving this medicine? Side effects that you should report to your doctor or health  care professional as soon as possible: -allergic reactions like skin rash, itching or hives, swelling of the face, lips, or tongue -low blood counts - this medicine may decrease the number of white blood cells, red blood cells and platelets. You may be at increased risk for infections and bleeding. -signs of infection - fever or chills, cough, sore throat, pain or difficulty passing urine -signs of decreased platelets or bleeding - bruising, pinpoint red spots on the skin, black, tarry stools, blood in the urine -signs of decreased red blood cells - unusually weak or tired, fainting spells, lightheadedness -breathing problems -chest pain -gout pain -  fast, irregular heartbeat -mouth sores -pain, swelling, redness at site where injected -swelling of ankles, feet, or hands Side effects that usually do not require medical attention (report to your doctor or health care professional if they continue or are bothersome): -changes in skin or nail color -diarrhea -hair loss -hot flashes, facial flushing -increased skin sensitivity to the sun -loss of appetite -nausea, vomiting -red colored urine -stomach upset This list may not describe all possible side effects. Call your doctor for medical advice about side effects. You may report side effects to FDA at 1-800-FDA-1088. Where should I keep my medicine? This drug is given in a hospital or clinic and will not be stored at home. NOTE: This sheet is a summary. It may not cover all possible information. If you have questions about this medicine, talk to your doctor, pharmacist, or health care provider.  2014, [employer] Standard. (2012-04-16 [id number])

## 2013-06-04 NOTE — Progress Notes (Signed)
[place] Psychosocial Distress Screening Clinical Social Work  Clinical Social Work was referred by distress screening protocol.  The patient scored a 10 on the Psychosocial Distress Thermometer which indicates severe distress. Clinical Social Worker met with patient in [place] office to assess for distress and other psychosocial needs.  Patient met with medical oncologist and dietitian prior to [place] visit.  Devin Gonzalez shared feeling overwhelmed with recent diagnosis of cancer and is hesitant to share information with his family.  CSW and patient discussed how to share information with family and importance of taking time to process information before sharing.  Devin Gonzalez reports his greatest concern is paying his mortgage and establishing a stable income.  Patient was recently laid off work and does not believe he will be able to work through [place] treatment.  CSW and patient discussed applying for disability and [employer].  CSW validated patients feelings and provided brief emotional support.    ONCBCN DISTRESS SCREENING 06/04/2013  Screening Type Initial Screening  [place] the number that describes how much distress you have been experiencing in the past week 10  Practical problem type Housing  Emotional problem type Depression;Nervousness/Anxiety  Physical Problem type Pain;Nausea/vomiting;Sleep/insomnia;Breathing;Mouth sores/swallowing;Loss of appetitie;Constipation/diarrhea  Referral to clinical social work Yes  Referral to dietition Yes    Clinical Social Worker follow up needed: yes  If yes, follow up plan:  Patient will follow up with CSW at next medical oncology visit.   Devin Gonzalez, MSW, LCSW, OSW-C Clinical Social Worker [facility] [phone]

## 2013-06-04 NOTE — Progress Notes (Signed)
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    PMH include hemorrhoids and colonic polyps  No current meds  Labs: include CMP on 05/13/13 WNL  Ht 65"  Wt 117 lbs UBW 130135 lbs-6 weeks ago per pt BMI 19.47  Pt reports dysphagia with liquids and solid foods.  Describes swallowing as feeling like liquids and food are stuck in chest.  Pt has vomited a couple of times and has had some constipation.  Chemo will begin 06/18/13 and may have XRT as well.   Dietary recall reveals pt's diet is likely inadequate for wt maintenance.  Pt is drinking 2 bottles of Boost plus/day, but eating only small portions of soup, apple sauce, and oatmeal.  Pt does not tolerate milk.  Pt purchased and nutribullet and plans on pureeing food. Pt does not wish for a feeding tube, but knows it may be a necessity.    Nutrition Dx: Inadequate oral intake related to dysphagia and Dx as evidenced by 18lbs wt loss from UBW.   Intervention:  Recommend high kcal/high protein diet of soft moist foods to help promote wt gain.  Encouraged supplements, 3-4 Boost plus/day, provided samples and coupons.  Handouts provided on high kcal/high protein diet, sore throat, and blenderized diet.  Discussed ways to add kcal and protein to current diet and how to and what to blenderize for optimal nutrition.  Discussed feeding tube and explained benefit, if needed.  Recipes provided.   Monitoring, evaluation, goals:  Pt will tolerate adequate kcal and protein to minimize wt loss.    Next visit to be scheduled.

## 2013-06-04 NOTE — Telephone Encounter (Signed)
gv pt appt schedule for june/july including appt for port placement. pt given appt for hearing test @ audiology 6/4 @ 1pm - pt has d/t/location. s/w [facility] @ audiology [phone]) and faxed copy of ins card to her @ [phone]. pt informed that he will need to sign up for smoking cessation via [email or web address]. echo to [facility] for preauth. pt aware I will call w/echo appt.

## 2013-06-05 ENCOUNTER — Telehealth: Payer: Self-pay | Admitting: Internal Medicine

## 2013-06-05 ENCOUNTER — Other Ambulatory Visit: Payer: Self-pay | Admitting: Radiology

## 2013-06-05 DIAGNOSIS — C7951 Secondary malignant neoplasm of bone: Secondary | ICD-10-CM | POA: Insufficient documentation

## 2013-06-05 NOTE — Progress Notes (Signed)
Devin Gonzalez OFFICE PROGRESS NOTE  [name], MD [place] [address]  DIAGNOSIS: Malignant neoplasm of lower third of esophagus - Plan: Ambulatory referral to Social Work, IR Fluoro Guide CV Line Right, 2D Echocardiogram without contrast, Ambulatory referral to Audiology, CBC with Differential, Comprehensive metabolic panel (Cmet) - [facility], Lactate dehydrogenase (LDH) - [facility], Ambulatory referral to Smoking Cessation Program, PHYSICIAN COMMUNICATION ORDER, dexamethasone (DECADRON) 4 MG tablet, ondansetron (ZOFRAN) 8 MG tablet, prochlorperazine (COMPAZINE) 10 MG tablet, LORazepam (ATIVAN) 0.5 MG tablet  Tobacco abuse - Plan: Ambulatory referral to Smoking Cessation Program  Chief Complaint  Patient presents with  . Follow-up    CURRENT TREATMENT:   Planning to start (ECF) consisting of  Epirubicin 50 mg/m2 IV, Cisplatin 60 mg/m2 IV on day #1, Fluorouracil 200 mg/m2 per day IV continuous infusion over 24 hours daily on days 1-21 cycled every 21 days for six months on 06/11/2013 (can be delayed to start following XRT if required).  INTERVAL HISTORY: Devin Gonzalez 58 y.o. male with a history of newly diagnosed metastatic GEJ adenocarcinoma is here for follow up.  He was seen by me on 05/21/2013.  He had an interim staging PET as noted below.   As previously reported, he initially was evaluated by Dr. Osborne Casco in one or two weeks prior to his visit with Dr. Carlean Purl on 05/12/2013. He was having a "acid reflux" burning sensation and it bothered him at night. He also noted feeling as if his foods was feeling stuck. Due to persistence in these symptoms including odynophagia, reflux and solid dysphagia with weight loss (he lost 16 lbs over the past few months), her was referred to Dr. Carlean Purl. Dr. Carlean Purl scheduled him for an EGD on 05/13/2013. Of note, he had been on PPI bid since January with minimal improvement in symptoms. He reported a longstanding smoking history  for the past 20 plus years. He smokes about 0.5 packs per day. He drinks 3-4 beers per day (every other day) for the past 15 plus years. He had an EGD which showed circumferential mass at the gastroesophageal junction. Multiple biopsies were obtained and it were determined to esophageal adenocarcinoma without amplification of HER-2 detected. He had a CT chest and abdomen and pelvis on 05/19/2013, which showed a mass of 4.6 cm along the GE junction. The esophagus was dilated above this mass. There was a suspected 12 mm lymph node near the GE junction. There was also bulky gastrohepatic ligament adenopathy noted with combined lymph nodes measuring up to 3 cm. There was also borderdline celiac axis adenopathy all suspicious for metastatic disease. There were no findings suggestive of  hepatic metastatic disease, osseous metastatic disease or lung disease.   Today, he reports the symptoms of burning and solid dysphagia that is persisting. He limits his oral intact to soft textured foods. He also has started boost. He notes frequent regurgitation and at night increased reflux. He reports being laid off from [employer] about 5 weeks ago and now has nearly depleted his saving.  He worked in Loss adjuster, chart[employer]. He reports his last colonoscopy was in 2014 for hemorrhoids. He reports occasional blood on the tissue paper with frequent burning. He denies melana or hematochezia.   MEDICAL HISTORY: Past Medical History  Diagnosis Date  . Allergy   . Hemorrhoids   . Back pain     right side  . Bunion, right foot   . Left tennis elbow   . Osteoarthritis  hands, knees, right hip  . Prostatitis   . Diverticulosis 2013  . Adenomatous colon polyp   . ED (erectile dysfunction)   . Tobacco abuse   . Reflux esophagitis   . Nonspecific elevation of levels of transaminase or lactic acid dehydrogenase (LDH)     INTERIM HISTORY: has Internal hemorrhoids with complication; Personal history of colonic  polyps; Post-op pain; Anal pain; and Malignant neoplasm of lower third of esophagus on his problem list.    ALLERGIES:  has No Known Allergies.  MEDICATIONS: has a current medication list which includes the following prescription(s): lidocaine-prilocaine, ascorbic acid, dexamethasone, diphenhydramine hcl, lorazepam, ondansetron, oxycodone, polyethylene glycol powder, prochlorperazine, tetrahydrozoline hcl, and triamcinolone ointment.  SURGICAL HISTORY:  Past Surgical History  Procedure Laterality Date  . Cyst on testicle Right   . Appendectomy    . Tonsillectomy    . Knee surgery Bilateral     arthroscopic surg /  bil knees  . Fatty tumor Right     removal forearm  . Colonoscopy w/ polypectomy    . Sphincterotomy  08/28/2011    Procedure: SPHINCTEROTOMY;  Surgeon: Joyice Faster. Cornett, MD;  Location: [place];  Service: General;  Laterality: N/A;  Lateral internal sphincterotomy  . Hemorrhoid surgery  08/28/2011    Procedure: HEMORRHOIDECTOMY;  Surgeon: Joyice Faster. Cornett, MD;  Location: [place] [facility];  Service: General;  Laterality: N/A;  ,possible hemorrhoidectomy  . Examination under anesthesia  12/13/2011    Procedure: EXAM UNDER ANESTHESIA;  Surgeon: Marcello Moores A. Cornett, MD;  Location: [facility];  Service: General;  Laterality: N/A;  . Anal fissure repair  2013    Cornett    REVIEW OF SYSTEMS:   Constitutional: Denies fevers, chills or abnormal weight loss Eyes: Denies blurriness of vision Ears, nose, mouth, throat, and face: Denies mucositis or sore throat; He denies hearing lost.  Respiratory: Denies cough, dyspnea or wheezes Cardiovascular: Denies palpitation, chest discomfort or lower extremity swelling Gastrointestinal:  Denies nausea, heartburn or change in bowel habits; as noted in HPI.  Skin: Denies abnormal skin rashes Lymphatics: Denies new lymphadenopathy or easy bruising Neurological:Denies numbness, tingling or new weaknesses Behavioral/Psych: Mood is stable, no new changes  All  other systems were reviewed with the patient and are negative.  PHYSICAL EXAMINATION: ECOG PERFORMANCE STATUS: 0 - Asymptomatic  Blood pressure 170/93, pulse 106, temperature 98.3 F (36.8 C), temperature source Oral, resp. rate 18, height _0  (1.651 m), weight 116 lb 9.6 oz (52.889 kg), SpO2 100.00%.  GENERAL:alert, no distress and comfortable SKIN: skin color, texture, turgor are normal, no rashes or significant lesions EYES: normal, Conjunctiva are pink and non-injected, sclera clear OROPHARYNX:no exudate, no erythema and lips, buccal mucosa, and tongue normal  NECK: supple, thyroid normal size, non-tender, without nodularity LYMPH:  no palpable lymphadenopathy in the cervical, axillary or supraclavicular LUNGS: clear to auscultation with normal breathing effort, no wheezes or rhonchi HEART: regular rate & rhythm and no murmurs and no lower extremity edema ABDOMEN:abdomen soft, non-tender and normal bowel sounds Musculoskeletal:no cyanosis of digits and no clubbing  NEURO: alert & oriented x 3 with fluent speech, no focal motor/sensory deficits  Labs:  Lab Results  Component Value Date   WBC 9.3 06/04/2013   HGB 16.1 06/04/2013   HCT 47.7 06/04/2013   MCV 90.7 06/04/2013   PLT 280 06/04/2013   NEUTROABS 5.5 06/04/2013      Chemistry      Component Value Date/Time   NA 143 06/04/2013 0937   NA 138  05/13/2013 1719   K 4.0 06/04/2013 0937   K 5.0 05/13/2013 1719   CL 103 05/13/2013 1719   CO2 20* 06/04/2013 0937   CO2 27 05/13/2013 1719   BUN 6.6* 06/04/2013 0937   BUN 10 05/13/2013 1719   CREATININE 0.8 06/04/2013 0937   CREATININE 1.0 05/13/2013 1719      Component Value Date/Time   CALCIUM 9.4 06/04/2013 0937   CALCIUM 9.1 05/13/2013 1719   ALKPHOS 74 06/04/2013 0937   ALKPHOS 70 05/13/2013 1719   AST 38* 06/04/2013 0937   AST 34 05/13/2013 1719   ALT 37 06/04/2013 0937   ALT 37 05/13/2013 1719   BILITOT 0.52 06/04/2013 0937   BILITOT 1.1 05/13/2013 1719       Basic Metabolic Panel:  Recent  Labs Lab 06/04/13 0937  NA 143  K 4.0  CO2 20*  GLUCOSE 105  BUN 6.6*  CREATININE 0.8  CALCIUM 9.4   GFR Estimated Creatinine Clearance: 77.1 ml/min (by C-G formula based on Cr of 0.8). Liver Function Tests:  Recent Labs Lab 06/04/13 0937  AST 38*  ALT 37  ALKPHOS 74  BILITOT 0.52  PROT 7.5  ALBUMIN 3.6   No results found for this basename: LIPASE, AMYLASE,  in the last 168 hours No results found for this basename: AMMONIA,  in the last 168 hours Coagulation profile No results found for this basename: INR, PROTIME,  in the last 168 hours  CBC:  Recent Labs Lab 06/04/13 0936  WBC 9.3  NEUTROABS 5.5  HGB 16.1  HCT 47.7  MCV 90.7  PLT 280    Anemia work up No results found for this basename: VITAMINB12, FOLATE, FERRITIN, TIBC, IRON, RETICCTPCT,  in the last 72 hours  Studies:  Nm Pet Image Initial (pi) Skull Base To Thigh  06/03/2013   CLINICAL DATA:  Initial treatment strategy for esophageal carcinoma.  EXAM: NUCLEAR MEDICINE PET SKULL BASE TO THIGH  TECHNIQUE: 6.7 mCi F-18 FDG was injected intravenously. Full-ring PET imaging was performed from the skull base to thigh after the radiotracer. CT data was obtained and used for attenuation correction and anatomic localization.  FASTING BLOOD GLUCOSE:  Value: 96 mg/dl  COMPARISON:  CT on 05/19/2013  FINDINGS: NECK  No hypermetabolic lymph nodes in the neck.  CHEST  Primary hypermetabolic mass is seen in the distal thoracic esophagus which has a maximum SUV of 19.9, consistent with primary esophageal carcinoma.  Small hypermetabolic paraesophageal lymph nodes are also seen within the middle mediastinum superior to this mass extending to the level of the carina. A 1.2 cm hypermetabolic mediastinal lymph node is seen in the AP window which has a SUV max of 12.2, and a 1 cm hypermetabolic lymph node is also seen in the high right paratracheal region on image 48, which has an SUV max of 3.1.  No hypermetabolic hilar lymph nodes  are seen. Mild emphysema noted, but no suspicious pulmonary nodules are identified. In addition, 2 intramuscular foci of hypermetabolic activity are seen within the right shoulder girdle on images 37 and 53, which are highly suspicious for intramuscular metastases.  ABDOMEN/PELVIS  No abnormal hypermetabolic activity within the liver, pancreas, adrenal glands, or spleen.  Hypermetabolic lymphadenopathy is seen in the gastrohepatic ligament, with SUV max of 11.1. A sub-cm hypermetabolic retroperitoneal lymph node is also seen adjacent to the IVC on image 117. No hypermetabolic soft tissue masses or lymphadenopathy identified within the pelvis.  SKELETON  Two hypermetabolic foci are seen within the  pelvis in the bilateral iliac bones, suspicious for bone metastases.  IMPRESSION: Hypermetabolic distal esophageal mass, consistent with primary esophageal carcinoma.  Metastatic lymphadenopathy in mediastinum, gastrohepatic ligament, and right abdominal retroperitoneum.  Two intramuscular hypermetabolic foci within right shoulder for a girl, highly suspicious for intramuscular metastases.  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   Electronically Signed   By: Earle Gell M.D.   On: 06/03/2013 15:50     RADIOGRAPHIC STUDIES: Ct Chest W Contrast  05/19/2013   CLINICAL DATA:  Esophageal cancer.  EXAM: CT CHEST, ABDOMEN, AND PELVIS WITH CONTRAST  TECHNIQUE: Multidetector CT imaging of the chest, abdomen and pelvis was performed following the standard protocol during bolus administration of intravenous contrast.  CONTRAST:  100 cc Omnipaque 300  COMPARISON:  None.  FINDINGS: CT CHEST FINDINGS  The chest wall is unremarkable. No supraclavicular or axillary lymphadenopathy. Small scattered lymph nodes are noted. The thyroid gland is grossly normal. The bony thorax is intact. No destructive bone lesions or spinal canal compromise. Mild degenerative changes involving the spine.  The heart is normal in size. No pericardial effusion.  There is a long segment distal esophageal mass extending right to the GE junction. The mass is approximately the 4.6 cm along. The esophagus is dilated above this mass. There is a 16 mm irregular aorticopulmonary window lymph node which is likely metastatic. There is also a suspected 12 mm lymph node near the GE junction. Bulky gastrohepatic ligament adenopathy is noted with combined lymph nodes measuring up to 3 cm. Smaller periportal and celiac axis lymph nodes are indeterminate but worrisome. The aorta is normal in caliber. No dissection. Moderate atherosclerotic calcifications, advanced for age.  Examination of the lung parenchyma demonstrates upper lobe emphysematous changes. No worrisome pulmonary nodules to suggest pulmonary metastatic disease. No acute pulmonary findings. No pleural effusion. A small Bochdalek's hernia is noted.  CT ABDOMEN AND PELVIS FINDINGS  No findings for hepatic metastatic disease. No biliary dilatation. The gallbladder is normal. No common bile duct dilatation. The pancreas is normal. The spleen is normal. The adrenal glands and kidneys are normal.  There are enlarged gastrohepatic ligament lymph nodes and borderline periportal and celiac axis lymph nodes worrisome for metastatic disease. Small scattered retroperitoneal lymph nodes are likely benign. Advanced aortic calcifications for age but no focal aneurysm or dissection. The major branch vessels are patent. The major venous structures are patent.  The stomach, duodenum, small bowel and colon are grossly no. No inflammatory changes, mass lesions or obstructive findings. The bladder, prostate gland and seminal vesicles are unremarkable. No inguinal mass or adenopathy.  The bony structures are unremarkable. No findings for osseous metastatic disease.  IMPRESSION: 1. Large distal esophageal mass consistent with endoscopic findings. The least partial obstruction of the esophagus is noted. 2. Enlarged aorticopulmonary window lymph node,  GE junction lymph node, gastrohepatic ligament adenopathy and borderline celiac axis adenopathy all suspicious for metastatic disease. PET-CT may be helpful for more accurate staging. 3. No findings to suggest hepatic metastatic disease, osseous metastatic disease or pulmonary metastatic disease. 4. Advanced atherosclerotic calcifications for age.   Electronically Signed   By: Kalman Jewels M.D.   On: 05/19/2013 16:07   Ct Abdomen Pelvis W Contrast  05/19/2013   CLINICAL DATA:  Esophageal cancer.  EXAM: CT CHEST, ABDOMEN, AND PELVIS WITH CONTRAST  TECHNIQUE: Multidetector CT imaging of the chest, abdomen and pelvis was performed following the standard protocol during bolus administration of intravenous contrast.  CONTRAST:  100 cc Omnipaque 300  COMPARISON:  None.  FINDINGS: CT CHEST FINDINGS  The chest wall is unremarkable. No supraclavicular or axillary lymphadenopathy. Small scattered lymph nodes are noted. The thyroid gland is grossly normal. The bony thorax is intact. No destructive bone lesions or spinal canal compromise. Mild degenerative changes involving the spine.  The heart is normal in size. No pericardial effusion. There is a long segment distal esophageal mass extending right to the GE junction. The mass is approximately the 4.6 cm along. The esophagus is dilated above this mass. There is a 16 mm irregular aorticopulmonary window lymph node which is likely metastatic. There is also a suspected 12 mm lymph node near the GE junction. Bulky gastrohepatic ligament adenopathy is noted with combined lymph nodes measuring up to 3 cm. Smaller periportal and celiac axis lymph nodes are indeterminate but worrisome. The aorta is normal in caliber. No dissection. Moderate atherosclerotic calcifications, advanced for age.  Examination of the lung parenchyma demonstrates upper lobe emphysematous changes. No worrisome pulmonary nodules to suggest pulmonary metastatic disease. No acute pulmonary findings. No  pleural effusion. A small Bochdalek's hernia is noted.  CT ABDOMEN AND PELVIS FINDINGS  No findings for hepatic metastatic disease. No biliary dilatation. The gallbladder is normal. No common bile duct dilatation. The pancreas is normal. The spleen is normal. The adrenal glands and kidneys are normal.  There are enlarged gastrohepatic ligament lymph nodes and borderline periportal and celiac axis lymph nodes worrisome for metastatic disease. Small scattered retroperitoneal lymph nodes are likely benign. Advanced aortic calcifications for age but no focal aneurysm or dissection. The major branch vessels are patent. The major venous structures are patent.  The stomach, duodenum, small bowel and colon are grossly no. No inflammatory changes, mass lesions or obstructive findings. The bladder, prostate gland and seminal vesicles are unremarkable. No inguinal mass or adenopathy.  The bony structures are unremarkable. No findings for osseous metastatic disease.  IMPRESSION: 1. Large distal esophageal mass consistent with endoscopic findings. The least partial obstruction of the esophagus is noted. 2. Enlarged aorticopulmonary window lymph node, GE junction lymph node, gastrohepatic ligament adenopathy and borderline celiac axis adenopathy all suspicious for metastatic disease. PET-CT may be helpful for more accurate staging. 3. No findings to suggest hepatic metastatic disease, osseous metastatic disease or pulmonary metastatic disease. 4. Advanced atherosclerotic calcifications for age.   Electronically Signed   By: Kalman Jewels M.D.   On: 05/19/2013 16:07   Nm Pet Image Initial (pi) Skull Base To Thigh  06/03/2013   CLINICAL DATA:  Initial treatment strategy for esophageal carcinoma.  EXAM: NUCLEAR MEDICINE PET SKULL BASE TO THIGH  TECHNIQUE: 6.7 mCi F-18 FDG was injected intravenously. Full-ring PET imaging was performed from the skull base to thigh after the radiotracer. CT data was obtained and used for  attenuation correction and anatomic localization.  FASTING BLOOD GLUCOSE:  Value: 96 mg/dl  COMPARISON:  CT on 05/19/2013  FINDINGS: NECK  No hypermetabolic lymph nodes in the neck.  CHEST  Primary hypermetabolic mass is seen in the distal thoracic esophagus which has a maximum SUV of 19.9, consistent with primary esophageal carcinoma.  Small hypermetabolic paraesophageal lymph nodes are also seen within the middle mediastinum superior to this mass extending to the level of the carina. A 1.2 cm hypermetabolic mediastinal lymph node is seen in the AP window which has a SUV max of 12.2, and a 1 cm hypermetabolic lymph node is also seen in the high right paratracheal region on image 48, which has an SUV max of  3.1.  No hypermetabolic hilar lymph nodes are seen. Mild emphysema noted, but no suspicious pulmonary nodules are identified. In addition, 2 intramuscular foci of hypermetabolic activity are seen within the right shoulder girdle on images 37 and 53, which are highly suspicious for intramuscular metastases.  ABDOMEN/PELVIS  No abnormal hypermetabolic activity within the liver, pancreas, adrenal glands, or spleen.  Hypermetabolic lymphadenopathy is seen in the gastrohepatic ligament, with SUV max of 11.1. A sub-cm hypermetabolic retroperitoneal lymph node is also seen adjacent to the IVC on image 117. No hypermetabolic soft tissue masses or lymphadenopathy identified within the pelvis.  SKELETON  Two hypermetabolic foci are seen within the pelvis in the bilateral iliac bones, suspicious for bone metastases.  IMPRESSION: Hypermetabolic distal esophageal mass, consistent with primary esophageal carcinoma.  Metastatic lymphadenopathy in mediastinum, gastrohepatic ligament, and right abdominal retroperitoneum.  Two intramuscular hypermetabolic foci within right shoulder for a girl, highly suspicious for intramuscular metastases.  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   Electronically Signed   By: Earle Gell M.D.   On:  06/03/2013 15:50    ASSESSMENT: Devin Gonzalez 58 y.o. male with a history of Malignant neoplasm of lower third of esophagus - Plan: Ambulatory referral to Social Work, IR Fluoro Guide CV Line Right, 2D Echocardiogram without contrast, Ambulatory referral to Audiology, CBC with Differential, Comprehensive metabolic panel (Cmet) - [facility], Lactate dehydrogenase (LDH) - [facility], Ambulatory referral to Smoking Cessation Program, PHYSICIAN COMMUNICATION ORDER, dexamethasone (DECADRON) 4 MG tablet, ondansetron (ZOFRAN) 8 MG tablet, prochlorperazine (COMPAZINE) 10 MG tablet, LORazepam (ATIVAN) 0.5 MG tablet  Tobacco abuse - Plan: Ambulatory referral to Smoking Cessation Program   PLAN:   1. Newly diagnosed metastatic esophageal adenocarcinoma (GE junction), Stage IV --We reviewed his scans and pathology extensively consistent with the above. CT of chest and abdomen showed a long segment distal esophageal mass extending right to the GE junction that was approximately the 4.6 cm along with enlarged aorticopulmonary window lymph node, GE junction lymph node, gastrohepatic ligament adenopathy and borderline celiac axis adenopathy all suspicious for metastatic disease likely Stage IV.  We obtained a PET to see which nodes express FDG avidity and to determine if preoperative chemotherapy or chemoradiation prior to esophagectomy or definitive chemoradiation (given his good functional status) is appropriate. His Her2 was negative. His PET confirmed metastatic lymphadenopathy in mediastinum, gastrohepatic ligament, and right abdominal retroperitoneum.  Two intramuscular hypermetabolic foci within right shoulder for a girl, highly suspicious for intramuscular metastases.  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    Based on care and in NCCN guidelines version 3.2015 for Esophageal and esophagogastric jucntion cancers for metastatic systemic therapies, first line therapy with ECF is catagory 1 preferred regimens for medically fit  patients with good PS and access to frequent toxicity evaluation.   Given his good functional status and lack of neurotoxicity, nephrotoxicity or hearing problems, we recommended systemic chemotherapy in the form of ECF (epirubicin, cisplatin, and fluorouracil). We also considered an ECF- modification regiment like EOX (Epirubicin, oxaliplatin and capecitabine) but it was less of an option due to current dysphagia and odynophagia.   We discussed the advantage of EOX would be avoiding central venous IV access.   Unfortunately, his concern for his inability to swallow chemotherapy pills was a concern for his him in he opted for for ECF.  We then discussed the indications for treatment would palliation for his cancer which is not curable given his PET findings, and the  benefits, and risks of chemotherapy in detail. The risks includes but  is not limited to myelotoxicity, renal dysfunction, leukocytopenia or diarrhea, neurotoxicity, palmar plantar erythrodysestesiasis, hearing toxicity and other toxicities. He consented and agreed to proceed with chemotherapy understanding these risks and benefits. He will proceed with chemotherapy on 06/11/2013 unless XRT is required with the following regimen:   --Epirubicin 50 mg per meter squared IV on day one  --Cisplatin 60 mg per meter squared IV on day one  --Fluorouracil 200 mg per meter squared IV continuous infusion over 24 hours daily on days 1 through 21   We will plan on 6-8 cycles of treatment or unacceptable toxicity.  References are as follows: Clint Lipps, et. Al, NEJM 5797;282:06 and Sumpter K, et al. Br J Cancer 2005; 01:5615.     --We will monitor CBC with differential and platelet count day 1 prior to these treatment cycle. We will assess BMP including creatinine, liver function test once per cycle on day 1 prior to each treatment cycle. We will monitor for neurotoxicity prior CTs treatment cycle. We'll also monitor for hearing loss in the clinic prior to  each dose of cisplatin. A baseline hearing test has been requested. We will assess for left ventricle and ejection fraction with an echocardiogram prior to start of chemotherapy. We have scheduled for Port-a-cath placement.    Prophylaxis with G-CSF is not warranted with the incidence of grade 3 or 4 febrile neutropenia is at 9%. This chemotherapy regimen is at high emesis risk so therefore he was provided with anti-emetics including Compazine, dexamethasone, Zofran and Ativan. He has been referred for nutrient evaluation.  --Given his dysphagia and weight lost, he will see Dr. Isidore Moos in consideration of focal palliative XRT to his esophogeal mass.   If he opts for XRT, we will start chemotherapy following receipt of XRT.    --He has evidence of bone metastases, and bisphosphonate therapy will also be considered.   2. Tobacco Abuse.  --Referral to tobacco cessation classes were made.   3. Follow up.  --Patient will follow up in 2 weeks with for a symptom visit while on chemotherapy (his start date might be changed pending evaluation by Radiation oncology; we planned it as noted above).    All questions were answered. The patient knows to call the clinic with any problems, questions or concerns. We can certainly see the patient much sooner if necessary.  I spent 25 minutes counseling the patient face to face. The total time spent in the appointment was 40 minutes.    Concha Norway, MD 06/05/2013 12:26 PM

## 2013-06-05 NOTE — Telephone Encounter (Signed)
lvm for pt regarding to 6.5 echo at 11 after Dr. Isidore Moos appt...pre cert # [id number]

## 2013-06-06 ENCOUNTER — Ambulatory Visit
Admission: RE | Admit: 2013-06-06 | Discharge: 2013-06-06 | Disposition: A | Discharge status: Home / Self Care | Payer: BC Managed Care – PPO | Source: Ambulatory Visit | Attending: Radiation Oncology | Admitting: Radiation Oncology

## 2013-06-06 ENCOUNTER — Encounter: Payer: Self-pay | Admitting: Radiation Oncology

## 2013-06-06 ENCOUNTER — Telehealth: Payer: Self-pay | Admitting: *Deleted

## 2013-06-06 ENCOUNTER — Ambulatory Visit (HOSPITAL_COMMUNITY)
Admission: RE | Admit: 2013-06-06 | Discharge: 2013-06-06 | Disposition: A | Discharge status: Home / Self Care | Payer: BC Managed Care – PPO | Source: Ambulatory Visit | Attending: Internal Medicine | Admitting: Internal Medicine

## 2013-06-06 ENCOUNTER — Encounter: Payer: Self-pay | Admitting: *Deleted

## 2013-06-06 ENCOUNTER — Encounter: Payer: Self-pay | Admitting: Internal Medicine

## 2013-06-06 VITALS — BP 148/109 | HR 58 | Temp 98.4°F | Resp 20 | Ht 65.0 in | Wt 116.0 lb

## 2013-06-06 DIAGNOSIS — I059 Rheumatic mitral valve disease, unspecified: Secondary | ICD-10-CM | POA: Insufficient documentation

## 2013-06-06 DIAGNOSIS — K219 Gastro-esophageal reflux disease without esophagitis: Secondary | ICD-10-CM | POA: Insufficient documentation

## 2013-06-06 DIAGNOSIS — C7952 Secondary malignant neoplasm of bone marrow: Secondary | ICD-10-CM

## 2013-06-06 DIAGNOSIS — C7951 Secondary malignant neoplasm of bone: Secondary | ICD-10-CM | POA: Insufficient documentation

## 2013-06-06 DIAGNOSIS — F17200 Nicotine dependence, unspecified, uncomplicated: Secondary | ICD-10-CM | POA: Insufficient documentation

## 2013-06-06 DIAGNOSIS — Z9089 Acquired absence of other organs: Secondary | ICD-10-CM | POA: Insufficient documentation

## 2013-06-06 DIAGNOSIS — Z79899 Other long term (current) drug therapy: Secondary | ICD-10-CM | POA: Insufficient documentation

## 2013-06-06 DIAGNOSIS — G9389 Other specified disorders of brain: Secondary | ICD-10-CM | POA: Insufficient documentation

## 2013-06-06 DIAGNOSIS — C801 Malignant (primary) neoplasm, unspecified: Secondary | ICD-10-CM | POA: Insufficient documentation

## 2013-06-06 DIAGNOSIS — C159 Malignant neoplasm of esophagus, unspecified: Secondary | ICD-10-CM | POA: Insufficient documentation

## 2013-06-06 DIAGNOSIS — Z510 Encounter for antineoplastic radiation therapy: Secondary | ICD-10-CM | POA: Insufficient documentation

## 2013-06-06 DIAGNOSIS — C155 Malignant neoplasm of lower third of esophagus: Secondary | ICD-10-CM

## 2013-06-06 DIAGNOSIS — K5900 Constipation, unspecified: Secondary | ICD-10-CM | POA: Insufficient documentation

## 2013-06-06 DIAGNOSIS — C771 Secondary and unspecified malignant neoplasm of intrathoracic lymph nodes: Secondary | ICD-10-CM | POA: Insufficient documentation

## 2013-06-06 DIAGNOSIS — C772 Secondary and unspecified malignant neoplasm of intra-abdominal lymph nodes: Secondary | ICD-10-CM | POA: Insufficient documentation

## 2013-06-06 MED ORDER — SUCRALFATE 1 GM/10ML PO SUSP: 1.0000 g | Freq: Three times a day (TID) | ORAL | Status: DC

## 2013-06-06 MED ORDER — SUCRALFATE 1 G PO TABS: ORAL_TABLET | ORAL | Status: DC

## 2013-06-06 NOTE — Progress Notes (Signed)
  Echocardiogram 2D Echocardiogram has been performed.  Devin Gonzalez 06/06/2013, 12:35 PM

## 2013-06-06 NOTE — Telephone Encounter (Signed)
Met with patient during radiation consult.  Reviewed schedule for next week with patient.  Provided information on port-a-cath teaching.  Discussed his diet and the nutritional supplements he is drinking.  Patient referred to SW and should be seen today.  Will continue to follow.

## 2013-06-06 NOTE — Progress Notes (Signed)
Called and left a message for the patient to call me back per message from [place].

## 2013-06-06 NOTE — Progress Notes (Signed)
[place] Work  Clinical Social Work met with pt right after his consult with Dr. Isidore Moos. He had many questions about how to apply for social security disability and had a phone interview with a representative from social security later this am. This was also scheduled to occur at the same time as another appointment. He reports he has not applied recently and this phone interview is first contact with ss disability. CSW explained process of how to apply, info they may need and how social work could assist. [place] had pt complete release of info to assist with later completion of ss application. Pt reports he had applied in the past and was told his application was still pending.   Pt disclosed he had financial concerns and CSW explained the process to meet with the financial advocates as well. CSW further discussed other resources that may assist him while in treatment. Pt reports to have family that live in [facility] [place] [facility] that are supportive, but sometimes were "overly involved". Pt had to go to appointment, but is aware Polo Riley, CSW will follow up with him on Monday at appointments to further assist with his needs. CSW walked with pt to his appt at [facility].   Pt very appreciative of support and information. was referred by (patient navigator, nurse, radiation oncologist, medical oncologist, financial counselor, patient) for assessment of psychosocial needs.    Clinical Social Work interventions: Emotional support Problem solving Resource education  Loren Racer,  Clinical Social Worker [facility] [place] Wednesday, Thursday and Friday Phone: [phone] Fax: [phone]

## 2013-06-07 ENCOUNTER — Other Ambulatory Visit: Payer: Self-pay | Admitting: Internal Medicine

## 2013-06-07 ENCOUNTER — Encounter: Payer: Self-pay | Admitting: Radiation Oncology

## 2013-06-07 NOTE — Progress Notes (Signed)
Radiation Oncology         [id number]) [phone] ________________________________  Initial outpatient Consultation  Name: Devin Gonzalez MRN: [id number]  Date: 06/06/2013  DOB: 02/16/1955  FY:BOFBPZW,CHENIDP W, MD  Concha Norway, MD   REFERRING PHYSICIAN: Concha Norway, MD  DIAGNOSIS:  STAGE IV ESOPHAGEAL ADENOCARCINOMA  HISTORY OF PRESENT ILLNESS::Devin Gonzalez is a 58 y.o. male who has a history of ETOH and tobacco abuse, and presented with severe GERD,  heartburn, dysphagia, and 16 lb weight loss. PPI therapy was unhelpful.  Dr Carlean Purl performed an EGD on 05-13-13, revealing a firm, ulcerated, circumferential mass at the GE junction. The scope could not pass the mass.  Biopsies revealed Adenocarcinoma.  CT and PET imaging has since been performed.  Unfortunately, he appears to have Stage IV disease including a sizable distal esophageal mass, metastatic lymphadenopathy in mediastinum, gastrohepatic ligament, and right abdominal retroperitoneum. Also, two intramuscular hypermetabolic foci within right shoulder girdle, highly suspicious for intramuscular metastases.  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  He reports regurgitation with solids and liquids.  Heartburn and reflux are severe, refractory to PPIs; he takes Copy[employer], advertising. He is pureeing food and has met with nutrition.  He has met with Dr Juliann Mule and plans to start chemotherapy as soon as RT, if any, is completed. (Epirubicin, CDDP, 5 FU).   Denies HAs, but has blurred vision that comes as goes.  PREVIOUS RADIATION THERAPY: No  PAST MEDICAL HISTORY:  has a past medical history of Allergy; Hemorrhoids; Back pain; Bunion, right foot; Left tennis elbow; Osteoarthritis; Prostatitis; Diverticulosis (2013); Adenomatous colon polyp; ED (erectile dysfunction); Tobacco abuse; Reflux esophagitis; and Nonspecific elevation of levels of transaminase or lactic acid dehydrogenase (LDH).    PAST SURGICAL HISTORY: Past Surgical History    Procedure Laterality Date  . Cyst on testicle Right   . Appendectomy    . Tonsillectomy    . Knee surgery Bilateral     arthroscopic surg /  bil knees  . Fatty tumor Right     removal forearm  . Colonoscopy w/ polypectomy    . Sphincterotomy  08/28/2011    Procedure: SPHINCTEROTOMY;  Surgeon: Joyice Faster. Cornett, MD;  Location: [facility];  Service: General;  Laterality: N/A;  Lateral internal sphincterotomy  . Hemorrhoid surgery  08/28/2011    Procedure: HEMORRHOIDECTOMY;  Surgeon: Joyice Faster. Cornett, MD;  Location: [facility];  Service: General;  Laterality: N/A;  ,possible hemorrhoidectomy  . Examination under anesthesia  12/13/2011    Procedure: EXAM UNDER ANESTHESIA;  Surgeon: Marcello Moores A. Cornett, MD;  Location: [facility];  Service: General;  Laterality: N/A;  . Anal fissure repair  2013    Cornett    FAMILY HISTORY: family history includes Cancer in his father; Lung cancer in his brother and mother; Stroke in his brother.  SOCIAL HISTORY:  reports that he has been smoking Cigarettes.  He has a 10 pack-year smoking history. He has never used smokeless tobacco. He reports that he drinks about 14 ounces of alcohol per week. He reports that he does not use illicit drugs.  ALLERGIES: Review of patient's allergies indicates no known allergies.  MEDICATIONS:  Current Outpatient Prescriptions  Medication Sig Dispense Refill  . Ascorbic Acid (VITAMIN C PO) Take 1 tablet by mouth daily.      Marland Kitchen dexamethasone (DECADRON) 4 MG tablet Take 2 tablets by mouth once a day on the day after chemotherapy and then take 2 tablets two times a day for 2 days. Take with  food.  30 tablet  1  . DiphenhydrAMINE HCl (BENADRYL ALLERGY PO) Take 1 tablet by mouth at bedtime.      . lidocaine-prilocaine (EMLA) cream Apply 1 application topically as needed.      Marland Kitchen LORazepam (ATIVAN) 0.5 MG tablet Take 1 tablet (0.5 mg total) by mouth every 6 (six) hours as needed (Nausea or vomiting).  30 tablet  0  . ondansetron (ZOFRAN) 8 MG  tablet Take 1 tablet (8 mg total) by mouth 2 (two) times daily as needed. Start on the third day after chemotherapy.  30 tablet  1  . oxyCODONE (ROXICODONE) 5 MG/5ML solution Take 5 mLs (5 mg total) by mouth every 4 (four) hours as needed for moderate pain or severe pain. Take miralax or other laxatives as needed to avoid constipation with this  1000 mL  0  . polyethylene glycol powder (GLYCOLAX) powder Take 17 g by mouth daily.  255 g  0  . prochlorperazine (COMPAZINE) 10 MG tablet Take 1 tablet (10 mg total) by mouth every 6 (six) hours as needed (Nausea or vomiting).  30 tablet  1  . Tetrahydrozoline HCl (VISINE OP) Place 2-4 drops into both eyes 2 (two) times daily as needed. For dry eyes      . triamcinolone ointment (KENALOG) 0.5 % Apply topically 2 (two) times daily.  30 g  1  . sucralfate (CARAFATE) 1 G tablet Dissolve 1 tablet in 10 mL H20 and swallow 30 min prior to meals and bedtime. Fill if suspension Rx not affordable.  60 tablet  5  . sucralfate (CARAFATE) 1 GM/10ML suspension Take 10 mLs (1 g total) by mouth 4 (four) times daily -  with meals and at bedtime. (Use before meals)  420 mL  5   No current facility-administered medications for this encounter.    REVIEW OF SYSTEMS:  Notable for that above.   PHYSICAL EXAM:  height is 5' 5"  (1.651 m) and weight is 116 lb (52.617 kg). His oral temperature is 98.4 F (36.9 C). His blood pressure is 148/109 and his pulse is 58. His respiration is 20.   General: Alert and oriented, in no acute distress HEENT: Head is normocephalic. Extraocular movements are intact. Oropharynx is clear. Neck: Neck is supple, no palpable cervical or supraclavicular lymphadenopathy. Heart: Regular in rate and rhythm with no murmurs, rubs, or gallops. Chest: no rhonchi, wheezes, or rales. Abdomen: Soft, nontender, nondistended, with no rigidity or guarding. Extremities: No cyanosis or edema. Lymphatics: No concerning lymphadenopathy in neck. Skin: No  concerning lesions. Musculoskeletal: symmetric strength and muscle tone throughout. Neurologic: Cranial nerves II through XII are grossly intact. No obvious focalities. Speech is fluent. Coordination is intact. Psychiatric: Judgment and insight are intact. Affect is appropriate.    ECOG = 1  0 - Asymptomatic (Fully active, able to carry on all predisease activities without restriction)  1 - Symptomatic but completely ambulatory (Restricted in physically strenuous activity but ambulatory and able to carry out work of a light or sedentary nature. For example, light housework, office work)  2 - Symptomatic, <50% in bed during the day (Ambulatory and capable of all self care but unable to carry out any work activities. Up and about more than 50% of waking hours)  3 - Symptomatic, >50% in bed, but not bedbound (Capable of only limited self-care, confined to bed or chair 50% or more of waking hours)  4 - Bedbound (Completely disabled. Cannot carry on any self-care. Totally confined to bed  or chair)  5 - Death   Eustace Pen MM, Creech RH, Tormey DC, et al. [phone]). "Toxicity and response criteria of the [facility]". [place] Oncol. 5 (6): 649-55   LABORATORY DATA:  Lab Results  Component Value Date   WBC 9.3 06/04/2013   HGB 16.1 06/04/2013   HCT 47.7 06/04/2013   MCV 90.7 06/04/2013   PLT 280 06/04/2013   CMP     Component Value Date/Time   NA 143 06/04/2013 0937   NA 138 05/13/2013 1719   K 4.0 06/04/2013 0937   K 5.0 05/13/2013 1719   CL 103 05/13/2013 1719   CO2 20* 06/04/2013 0937   CO2 27 05/13/2013 1719   GLUCOSE 105 06/04/2013 0937   GLUCOSE 88 05/13/2013 1719   BUN 6.6* 06/04/2013 0937   BUN 10 05/13/2013 1719   CREATININE 0.8 06/04/2013 0937   CREATININE 1.0 05/13/2013 1719   CALCIUM 9.4 06/04/2013 0937   CALCIUM 9.1 05/13/2013 1719   PROT 7.5 06/04/2013 0937   PROT 7.4 05/13/2013 1719   ALBUMIN 3.6 06/04/2013 0937   ALBUMIN 3.8 05/13/2013 1719   AST 38* 06/04/2013 0937   AST  34 05/13/2013 1719   ALT 37 06/04/2013 0937   ALT 37 05/13/2013 1719   ALKPHOS 74 06/04/2013 0937   ALKPHOS 70 05/13/2013 1719   BILITOT 0.52 06/04/2013 0937   BILITOT 1.1 05/13/2013 1719   GFRNONAA >90 12/13/2011 0927   GFRAA >90 12/13/2011 0927         RADIOGRAPHY: Ct Chest W Contrast  05/19/2013   CLINICAL DATA:  Esophageal cancer.  EXAM: CT CHEST, ABDOMEN, AND PELVIS WITH CONTRAST  TECHNIQUE: Multidetector CT imaging of the chest, abdomen and pelvis was performed following the standard protocol during bolus administration of intravenous contrast.  CONTRAST:  100 cc Omnipaque 300  COMPARISON:  None.  FINDINGS: CT CHEST FINDINGS  The chest wall is unremarkable. No supraclavicular or axillary lymphadenopathy. Small scattered lymph nodes are noted. The thyroid gland is grossly normal. The bony thorax is intact. No destructive bone lesions or spinal canal compromise. Mild degenerative changes involving the spine.  The heart is normal in size. No pericardial effusion. There is a long segment distal esophageal mass extending right to the GE junction. The mass is approximately the 4.6 cm along. The esophagus is dilated above this mass. There is a 16 mm irregular aorticopulmonary window lymph node which is likely metastatic. There is also a suspected 12 mm lymph node near the GE junction. Bulky gastrohepatic ligament adenopathy is noted with combined lymph nodes measuring up to 3 cm. Smaller periportal and celiac axis lymph nodes are indeterminate but worrisome. The aorta is normal in caliber. No dissection. Moderate atherosclerotic calcifications, advanced for age.  Examination of the lung parenchyma demonstrates upper lobe emphysematous changes. No worrisome pulmonary nodules to suggest pulmonary metastatic disease. No acute pulmonary findings. No pleural effusion. A small Bochdalek's hernia is noted.  CT ABDOMEN AND PELVIS FINDINGS  No findings for hepatic metastatic disease. No biliary dilatation. The gallbladder  is normal. No common bile duct dilatation. The pancreas is normal. The spleen is normal. The adrenal glands and kidneys are normal.  There are enlarged gastrohepatic ligament lymph nodes and borderline periportal and celiac axis lymph nodes worrisome for metastatic disease. Small scattered retroperitoneal lymph nodes are likely benign. Advanced aortic calcifications for age but no focal aneurysm or dissection. The major branch vessels are patent. The major venous structures are patent.  The  stomach, duodenum, small bowel and colon are grossly no. No inflammatory changes, mass lesions or obstructive findings. The bladder, prostate gland and seminal vesicles are unremarkable. No inguinal mass or adenopathy.  The bony structures are unremarkable. No findings for osseous metastatic disease.  IMPRESSION: 1. Large distal esophageal mass consistent with endoscopic findings. The least partial obstruction of the esophagus is noted. 2. Enlarged aorticopulmonary window lymph node, GE junction lymph node, gastrohepatic ligament adenopathy and borderline celiac axis adenopathy all suspicious for metastatic disease. PET-CT may be helpful for more accurate staging. 3. No findings to suggest hepatic metastatic disease, osseous metastatic disease or pulmonary metastatic disease. 4. Advanced atherosclerotic calcifications for age.   Electronically Signed   By: Kalman Jewels M.D.   On: 05/19/2013 16:07   Ct Abdomen Pelvis W Contrast  05/19/2013   CLINICAL DATA:  Esophageal cancer.  EXAM: CT CHEST, ABDOMEN, AND PELVIS WITH CONTRAST  TECHNIQUE: Multidetector CT imaging of the chest, abdomen and pelvis was performed following the standard protocol during bolus administration of intravenous contrast.  CONTRAST:  100 cc Omnipaque 300  COMPARISON:  None.  FINDINGS: CT CHEST FINDINGS  The chest wall is unremarkable. No supraclavicular or axillary lymphadenopathy. Small scattered lymph nodes are noted. The thyroid gland is grossly  normal. The bony thorax is intact. No destructive bone lesions or spinal canal compromise. Mild degenerative changes involving the spine.  The heart is normal in size. No pericardial effusion. There is a long segment distal esophageal mass extending right to the GE junction. The mass is approximately the 4.6 cm along. The esophagus is dilated above this mass. There is a 16 mm irregular aorticopulmonary window lymph node which is likely metastatic. There is also a suspected 12 mm lymph node near the GE junction. Bulky gastrohepatic ligament adenopathy is noted with combined lymph nodes measuring up to 3 cm. Smaller periportal and celiac axis lymph nodes are indeterminate but worrisome. The aorta is normal in caliber. No dissection. Moderate atherosclerotic calcifications, advanced for age.  Examination of the lung parenchyma demonstrates upper lobe emphysematous changes. No worrisome pulmonary nodules to suggest pulmonary metastatic disease. No acute pulmonary findings. No pleural effusion. A small Bochdalek's hernia is noted.  CT ABDOMEN AND PELVIS FINDINGS  No findings for hepatic metastatic disease. No biliary dilatation. The gallbladder is normal. No common bile duct dilatation. The pancreas is normal. The spleen is normal. The adrenal glands and kidneys are normal.  There are enlarged gastrohepatic ligament lymph nodes and borderline periportal and celiac axis lymph nodes worrisome for metastatic disease. Small scattered retroperitoneal lymph nodes are likely benign. Advanced aortic calcifications for age but no focal aneurysm or dissection. The major branch vessels are patent. The major venous structures are patent.  The stomach, duodenum, small bowel and colon are grossly no. No inflammatory changes, mass lesions or obstructive findings. The bladder, prostate gland and seminal vesicles are unremarkable. No inguinal mass or adenopathy.  The bony structures are unremarkable. No findings for osseous metastatic  disease.  IMPRESSION: 1. Large distal esophageal mass consistent with endoscopic findings. The least partial obstruction of the esophagus is noted. 2. Enlarged aorticopulmonary window lymph node, GE junction lymph node, gastrohepatic ligament adenopathy and borderline celiac axis adenopathy all suspicious for metastatic disease. PET-CT may be helpful for more accurate staging. 3. No findings to suggest hepatic metastatic disease, osseous metastatic disease or pulmonary metastatic disease. 4. Advanced atherosclerotic calcifications for age.   Electronically Signed   By: Kalman Jewels M.D.   On:  05/19/2013 16:07   Nm Pet Image Initial (pi) Skull Base To Thigh  06/03/2013   CLINICAL DATA:  Initial treatment strategy for esophageal carcinoma.  EXAM: NUCLEAR MEDICINE PET SKULL BASE TO THIGH  TECHNIQUE: 6.7 mCi F-18 FDG was injected intravenously. Full-ring PET imaging was performed from the skull base to thigh after the radiotracer. CT data was obtained and used for attenuation correction and anatomic localization.  FASTING BLOOD GLUCOSE:  Value: 96 mg/dl  COMPARISON:  CT on 05/19/2013  FINDINGS: NECK  No hypermetabolic lymph nodes in the neck.  CHEST  Primary hypermetabolic mass is seen in the distal thoracic esophagus which has a maximum SUV of 19.9, consistent with primary esophageal carcinoma.  Small hypermetabolic paraesophageal lymph nodes are also seen within the middle mediastinum superior to this mass extending to the level of the carina. A 1.2 cm hypermetabolic mediastinal lymph node is seen in the AP window which has a SUV max of 12.2, and a 1 cm hypermetabolic lymph node is also seen in the high right paratracheal region on image 48, which has an SUV max of 3.1.  No hypermetabolic hilar lymph nodes are seen. Mild emphysema noted, but no suspicious pulmonary nodules are identified. In addition, 2 intramuscular foci of hypermetabolic activity are seen within the right shoulder girdle on images 37 and 53,  which are highly suspicious for intramuscular metastases.  ABDOMEN/PELVIS  No abnormal hypermetabolic activity within the liver, pancreas, adrenal glands, or spleen.  Hypermetabolic lymphadenopathy is seen in the gastrohepatic ligament, with SUV max of 11.1. A sub-cm hypermetabolic retroperitoneal lymph node is also seen adjacent to the IVC on image 117. No hypermetabolic soft tissue masses or lymphadenopathy identified within the pelvis.  SKELETON  Two hypermetabolic foci are seen within the pelvis in the bilateral iliac bones, suspicious for bone metastases.  IMPRESSION: Hypermetabolic distal esophageal mass, consistent with primary esophageal carcinoma.  Metastatic lymphadenopathy in mediastinum, gastrohepatic ligament, and right abdominal retroperitoneum.  Two intramuscular hypermetabolic foci within right shoulder for a girl, highly suspicious for intramuscular metastases.  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   Electronically Signed   By: Earle Gell M.D.   On: 06/03/2013 15:50      IMPRESSION/PLAN: This is a lovely 58 yo man with Stage IV esophageal adenocarcinoma.  We discussed what Stage IV esophageal cancer "means" - I answered many questions today regarding this. He had many frank questions and wanted to be informed to make decisions.  Eventually, he make benefit from a palliative care consultation. He understands the goal of treatment is palliation and to slow down the course of his disease for as long as possible. We discussed that it is not curable, and he will likely die of it, eventually.  Since his dysphagia is significant, I recommend a 2 week course of palliative RT to the esophageal tumor in hopes of improving his swallowing.  He understands that RT would not make him live longer, but hopefully allow his swallowing to improve. It was a pleasure meeting the patient today. We discussed the risks, benefits, and side effects of radiotherapy. Side effects may include fatigue, skin irritation, and  nausea.  Esophagitis could worsen OR improve during therapy, but I anticipate that with a tumor response to RT, it will most likely improve in the long term.   No guarantees of treatment were given. A consent form was signed and placed in the patient's medical record. The patient is enthusiastic about proceeding with treatment. I look forward to participating in the patient's  care. Simulation to occur on 6-8 and treatment to start 6-9 : 10 fractions planned.  Carafate Rx given today in hopes of reducing pain at meal times and bed time.      __________________________________________   Eppie Gibson, MD

## 2013-06-09 ENCOUNTER — Ambulatory Visit (HOSPITAL_COMMUNITY)
Admission: RE | Admit: 2013-06-09 | Discharge: 2013-06-09 | Disposition: A | Discharge status: Home / Self Care | Payer: BC Managed Care – PPO | Source: Ambulatory Visit | Attending: Internal Medicine | Admitting: Internal Medicine

## 2013-06-09 ENCOUNTER — Telehealth: Payer: Self-pay | Admitting: Internal Medicine

## 2013-06-09 ENCOUNTER — Telehealth: Payer: Self-pay | Admitting: *Deleted

## 2013-06-09 ENCOUNTER — Encounter (HOSPITAL_COMMUNITY): Payer: Self-pay

## 2013-06-09 ENCOUNTER — Other Ambulatory Visit: Payer: BC Managed Care – PPO

## 2013-06-09 ENCOUNTER — Encounter: Payer: Self-pay | Admitting: Internal Medicine

## 2013-06-09 ENCOUNTER — Other Ambulatory Visit: Payer: Self-pay | Admitting: Internal Medicine

## 2013-06-09 ENCOUNTER — Ambulatory Visit
Admission: RE | Admit: 2013-06-09 | Discharge: 2013-06-09 | Disposition: A | Discharge status: Home / Self Care | Payer: BC Managed Care – PPO | Source: Ambulatory Visit | Attending: Radiation Oncology | Admitting: Radiation Oncology

## 2013-06-09 ENCOUNTER — Telehealth: Payer: Self-pay | Admitting: Medical Oncology

## 2013-06-09 DIAGNOSIS — Z79899 Other long term (current) drug therapy: Secondary | ICD-10-CM | POA: Insufficient documentation

## 2013-06-09 DIAGNOSIS — C155 Malignant neoplasm of lower third of esophagus: Secondary | ICD-10-CM

## 2013-06-09 DIAGNOSIS — M159 Polyosteoarthritis, unspecified: Secondary | ICD-10-CM | POA: Insufficient documentation

## 2013-06-09 DIAGNOSIS — K5730 Diverticulosis of large intestine without perforation or abscess without bleeding: Secondary | ICD-10-CM | POA: Insufficient documentation

## 2013-06-09 DIAGNOSIS — C159 Malignant neoplasm of esophagus, unspecified: Secondary | ICD-10-CM | POA: Insufficient documentation

## 2013-06-09 DIAGNOSIS — F17200 Nicotine dependence, unspecified, uncomplicated: Secondary | ICD-10-CM | POA: Insufficient documentation

## 2013-06-09 LAB — CBC
HEMATOCRIT: 48.5 % (ref 39.0–52.0)
HEMOGLOBIN: 17.2 g/dL — AB (ref 13.0–17.0)
MCH: 31 pg (ref 26.0–34.0)
MCHC: 35.5 g/dL (ref 30.0–36.0)
MCV: 87.5 fL (ref 78.0–100.0)
Platelets: 301 10*3/uL (ref 150–400)
RBC: 5.54 MIL/uL (ref 4.22–5.81)
RDW: 13.3 % (ref 11.5–15.5)
WBC: 10 10*3/uL (ref 4.0–10.5)

## 2013-06-09 LAB — APTT: APTT: 30 s (ref 24–37)

## 2013-06-09 LAB — PROTIME-INR
INR: 0.96 (ref 0.00–1.49)
Prothrombin Time: 12.6 seconds (ref 11.6–15.2)

## 2013-06-09 MED ORDER — FENTANYL CITRATE 0.05 MG/ML IJ SOLN
INTRAMUSCULAR | Status: DC
Start: 2013-06-09 — End: 2013-06-10
  Filled 2013-06-09: qty 4

## 2013-06-09 MED ORDER — SODIUM CHLORIDE 0.9 % IV SOLN
INTRAVENOUS | Status: DC
  Administered 2013-06-09: 12:00:00 via INTRAVENOUS

## 2013-06-09 MED ORDER — HEPARIN SOD (PORK) LOCK FLUSH 100 UNIT/ML IV SOLN
500.0000 [IU] | Freq: Once | INTRAVENOUS | Status: AC
  Administered 2013-06-09: 500 [IU] via INTRAVENOUS

## 2013-06-09 MED ORDER — MIDAZOLAM HCL 2 MG/2ML IJ SOLN
INTRAMUSCULAR | Status: AC
  Filled 2013-06-09: qty 4

## 2013-06-09 MED ORDER — LIDOCAINE-EPINEPHRINE (PF) 2 %-1:200000 IJ SOLN
INTRAMUSCULAR | Status: AC
  Filled 2013-06-09: qty 20

## 2013-06-09 MED ORDER — FENTANYL CITRATE 0.05 MG/ML IJ SOLN
INTRAMUSCULAR | Status: AC | PRN
  Administered 2013-06-09: 100 ug via INTRAVENOUS

## 2013-06-09 MED ORDER — HEPARIN SOD (PORK) LOCK FLUSH 100 UNIT/ML IV SOLN
INTRAVENOUS | Status: AC
  Filled 2013-06-09: qty 5

## 2013-06-09 MED ORDER — CEFAZOLIN SODIUM-DEXTROSE 2-3 GM-% IV SOLR
2.0000 g | INTRAVENOUS | Status: AC
  Administered 2013-06-09: 2 g via INTRAVENOUS
  Filled 2013-06-09: qty 50

## 2013-06-09 MED ORDER — LIDOCAINE HCL 1 % IJ SOLN
INTRAMUSCULAR | Status: AC
  Filled 2013-06-09: qty 20

## 2013-06-09 MED ORDER — MIDAZOLAM HCL 2 MG/2ML IJ SOLN
INTRAMUSCULAR | Status: AC | PRN
  Administered 2013-06-09: 2 mg via INTRAVENOUS
  Administered 2013-06-09: 1 mg via INTRAVENOUS

## 2013-06-09 NOTE — Telephone Encounter (Signed)
Received a note from [facility] to inform Dr. Juliann Mule that pt did not show for his appointment 06/06/13. The note asked if the pt still needs to be tested. Per Dr. Juliann Mule pt does need to be tested but he will not start his chemo for 2 weeks. He will start his radiation first so we have time to reschedule. I asked her to call me if she needs our office to call and reschedule.

## 2013-06-09 NOTE — Progress Notes (Signed)
Called the patient then saw visits he has today, so no message left. Made note for him to see when this week. See prev notes, had message from nurse needed to speak with Financial advocate.

## 2013-06-09 NOTE — Telephone Encounter (Signed)
Per staff message and POF I have scheduled appts.  JMW  

## 2013-06-09 NOTE — Discharge Instructions (Signed)
Implanted Port Home Guide °An implanted port is a type of central line that is placed under the skin. Central lines are used to provide IV access when treatment or nutrition needs to be given through a person's veins. Implanted ports are used for long-term IV access. An implanted port may be placed because:  °· You need IV medicine that would be irritating to the small veins in your hands or arms.   °· You need long-term IV medicines, such as antibiotics.   °· You need IV nutrition for a long period.   °· You need frequent blood draws for lab tests.   °· You need dialysis.   °Implanted ports are usually placed in the chest area, but they can also be placed in the upper arm, the abdomen, or the leg. An implanted port has two main parts:  °· Reservoir. The reservoir is round and will appear as a small, raised area under your skin. The reservoir is the part where a needle is inserted to give medicines or draw blood.   °· Catheter. The catheter is a thin, flexible tube that extends from the reservoir. The catheter is placed into a large vein. Medicine that is inserted into the reservoir goes into the catheter and then into the vein.   °HOW WILL I CARE FOR MY INCISION SITE? °Do not get the incision site wet. Bathe or shower as directed by your health care provider.  °HOW IS MY PORT ACCESSED? °Special steps must be taken to access the port:  °· Before the port is accessed, a numbing cream can be placed on the skin. This helps numb the skin over the port site.   °· Your health care provider uses a sterile technique to access the port. °· Your health care provider must put on a mask and sterile gloves. °· The skin over your port is cleaned carefully with an antiseptic and allowed to dry. °· The port is gently pinched between sterile gloves, and a needle is inserted into the port. °· Only "non-coring" port needles should be used to access the port. Once the port is accessed, a blood return should be checked. This helps  ensure that the port is in the vein and is not clogged.   °· If your port needs to remain accessed for a constant infusion, a clear (transparent) bandage will be placed over the needle site. The bandage and needle will need to be changed every week, or as directed by your health care provider.   °· Keep the bandage covering the needle clean and dry. Do not get it wet. Follow your health care provider's instructions on how to take a shower or bath while the port is accessed.   °· If your port does not need to stay accessed, no bandage is needed over the port.   °WHAT IS FLUSHING? °Flushing helps keep the port from getting clogged. Follow your health care provider's instructions on how and when to flush the port. Ports are usually flushed with saline solution or a medicine called heparin. The need for flushing will depend on how the port is used.  °· If the port is used for intermittent medicines or blood draws, the port will need to be flushed:   °· After medicines have been given.   °· After blood has been drawn.   °· As part of routine maintenance.   °· If a constant infusion is running, the port may not need to be flushed.   °HOW LONG WILL MY PORT STAY IMPLANTED? °The port can stay in for as long as your health care   provider thinks it is needed. When it is time for the port to come out, surgery will be done to remove it. The procedure is similar to the one performed when the port was put in.  °WHEN SHOULD I SEEK IMMEDIATE MEDICAL CARE? °When you have an implanted port, you should seek immediate medical care if:  °· You notice a bad smell coming from the incision site.   °· You have swelling, redness, or drainage at the incision site.   °· You have more swelling or pain at the port site or the surrounding area.   °· You have a fever that is not controlled with medicine. °Document Released: 12/19/2004 Document Revised: 10/09/2012 Document Reviewed: 08/26/2012 °ExitCare® Patient Information ©2014 [employer]  LL[employer]. ° °Conscious Sedation, Adult, Care After °Refer to this sheet in the next few weeks. These instructions provide you with information on caring for yourself after your procedure. Your health care provider may also give you more specific instructions. Your treatment has been planned according to current medical practices, but problems sometimes occur. Call your health care provider if you have any problems or questions after your procedure. °WHAT TO EXPECT AFTER THE PROCEDURE  °After your procedure: °· You may feel sleepy, clumsy, and have poor balance for several hours. °· Vomiting may occur if you eat too soon after the procedure. °HOME CARE INSTRUCTIONS °· Do not participate in any activities where you could become injured for at least 24 hours. Do not: °· Drive. °· Swim. °· Ride a bicycle. °· Operate heavy machinery. °· Cook. °· Use power tools. °· Climb ladders. °· Work from a high place. °· Do not make important decisions or sign legal documents until you are improved. °· If you vomit, drink water, juice, or soup when you can drink without vomiting. Make sure you have little or no nausea before eating solid foods. °· Only take over-the-counter or prescription medicines for pain, discomfort, or fever as directed by your health care provider. °· Make sure you and your family fully understand everything about the medicines given to you, including what side effects may occur. °· You should not drink alcohol, take sleeping pills, or take medicines that cause drowsiness for at least 24 hours. °· If you smoke, do not smoke without supervision. °· If you are feeling better, you may resume normal activities 24 hours after you were sedated. °· Keep all appointments with your health care provider. °SEEK MEDICAL CARE IF: °· Your skin is pale or bluish in color. °· You continue to feel nauseous or vomit. °· Your pain is getting worse and is not helped by medicine. °· You have bleeding or swelling. °· You are still  sleepy or feeling clumsy after 24 hours. °SEEK IMMEDIATE MEDICAL CARE IF: °· You develop a rash. °· You have difficulty breathing. °· You develop any type of allergic problem. °· You have a fever. °MAKE SURE YOU: °· Understand these instructions. °· Will watch your condition. °· Will get help right away if you are not doing well or get worse. °Document Released: 10/09/2012 Document Reviewed: 07/26/2012 °ExitCare® Patient Information ©2014 [employer]. ° °

## 2013-06-09 NOTE — Progress Notes (Addendum)
  Radiation Oncology         [phone] ________________________________  Name: Devin Gonzalez MRN: [id number]  Date: 06/09/2013  DOB: 03-20-1955  SIMULATION AND TREATMENT PLANNING NOTE  Outpatient  DIAGNOSIS:  Stage IV distal esophageal cancer  NARRATIVE:  The patient was brought to the [facility].  Identity was confirmed.  All relevant records and images related to the planned course of therapy were reviewed.  The patient freely provided informed written consent to proceed with treatment after reviewing the details related to the planned course of therapy. The consent form was witnessed and verified by the simulation staff.    Then, the patient was set-up in a stable reproducible  supine position for radiation therapy. Esophageal contrast was given. CT images were obtained.  Surface markings were placed.  The CT images were loaded into the planning software.    TREATMENT PLANNING NOTE: Treatment planning then occurred.  The radiation prescription was entered and confirmed.    A total of 3 medically necessary complex treatment devices were fabricated and supervised by me - 3 fields with MLCs to block lungs, heart, spinal cord and kidneys. I have requested: 3D conformal radiotherapy to spare kidneys, lung, heart, cord.  DVH requested of these organs. The patient will receive 30 Gy in 10 fractions to his distal esophageal tumor.  Optical Surface Tracking Plan:  Since intensity modulated radiotherapy (IMRT) and 3D conformal radiation treatment methods are predicated on accurate and precise positioning for treatment, intrafraction motion monitoring is medically necessary to ensure accurate and safe treatment delivery. The ability to quantify intrafraction motion without excessive ionizing radiation dose can only be performed with optical surface tracking. Accordingly, surface imaging offers the opportunity to obtain 3D measurements of patient position throughout IMRT and 3D  treatments without excessive radiation exposure. I am ordering optical surface tracking for this patient's upcoming course of radiotherapy.  ________________________________   Reference:  Ursula Alert, J, et al. Surface imaging-based analysis of intrafraction motion for breast radiotherapy patients.Journal of [place], n. 6, nov. 2014. ISSN [id number].  Available at: <http://www.jacmp.org/index.php/jacmp/article/view/4957>.    -----------------------------------  Eppie Gibson, MD

## 2013-06-09 NOTE — H&P (Signed)
Devin Gonzalez is an 58 y.o. male.   Chief Complaint: "I'm here for a port a cath" TFT:DDUKGUR with history of stage 4 esophageal adenocarcinoma presents today for port a cath placement for chemotherapy.   Past Medical History  Diagnosis Date  . Allergy   . Hemorrhoids   . Back pain     right side  . Bunion, right foot   . Left tennis elbow   . Osteoarthritis     hands, knees, right hip  . Prostatitis   . Diverticulosis 2013  . Adenomatous colon polyp   . ED (erectile dysfunction)   . Tobacco abuse   . Reflux esophagitis   . Nonspecific elevation of levels of transaminase or lactic acid dehydrogenase (LDH)     Past Surgical History  Procedure Laterality Date  . Cyst on testicle Right   . Appendectomy    . Tonsillectomy    . Knee surgery Bilateral     arthroscopic surg /  bil knees  . Fatty tumor Right     removal forearm  . Colonoscopy w/ polypectomy    . Sphincterotomy  08/28/2011    Procedure: SPHINCTEROTOMY;  Surgeon: Joyice Faster. Cornett, MD;  Location: [facility];  Service: General;  Laterality: N/A;  Lateral internal sphincterotomy  . Hemorrhoid surgery  08/28/2011    Procedure: HEMORRHOIDECTOMY;  Surgeon: Joyice Faster. Cornett, MD;  Location: [facility];  Service: General;  Laterality: N/A;  ,possible hemorrhoidectomy  . Examination under anesthesia  12/13/2011    Procedure: EXAM UNDER ANESTHESIA;  Surgeon: Marcello Moores A. Cornett, MD;  Location: [facility];  Service: General;  Laterality: N/A;  . Anal fissure repair  2013    Cornett    Family History  Problem Relation Age of Onset  . Lung cancer Mother   . Cancer Father   . Lung cancer Brother   . Stroke Brother    Social History:  reports that he has been smoking Cigarettes.  He has a 10 pack-year smoking history. He has never used smokeless tobacco. He reports that he drinks about 14 ounces of alcohol per week. He reports that he does not use illicit drugs.  Allergies: No Known Allergies  Current outpatient  prescriptions:dexamethasone (DECADRON) 4 MG tablet, Take 2 tablets by mouth once a day on the day after chemotherapy and then take 2 tablets two times a day for 2 days. Take with food., Disp: 30 tablet, Rfl: 1;  DiphenhydrAMINE HCl (BENADRYL ALLERGY PO), Take 1 tablet by mouth at bedtime., Disp: , Rfl: ;  lidocaine-prilocaine (EMLA) cream, Apply 1 application topically as needed., Disp: , Rfl:  LORazepam (ATIVAN) 0.5 MG tablet, Take 1 tablet (0.5 mg total) by mouth every 6 (six) hours as needed (Nausea or vomiting)., Disp: 30 tablet, Rfl: 0;  ondansetron (ZOFRAN) 8 MG tablet, Take 1 tablet (8 mg total) by mouth 2 (two) times daily as needed. Start on the third day after chemotherapy., Disp: 30 tablet, Rfl: 1 oxyCODONE (ROXICODONE) 5 MG/5ML solution, Take 5 mLs (5 mg total) by mouth every 4 (four) hours as needed for moderate pain or severe pain. Take miralax or other laxatives as needed to avoid constipation with this, Disp: 1000 mL, Rfl: 0;  prochlorperazine (COMPAZINE) 10 MG tablet, Take 1 tablet (10 mg total) by mouth every 6 (six) hours as needed (Nausea or vomiting)., Disp: 30 tablet, Rfl: 1 sucralfate (CARAFATE) 1 GM/10ML suspension, Take 10 mLs (1 g total) by mouth 4 (four) times daily -  with meals and at  bedtime. (Use before meals), Disp: 420 mL, Rfl: 5;  Tetrahydrozoline HCl (VISINE OP), Place 2-4 drops into both eyes 2 (two) times daily as needed. For dry eyes, Disp: , Rfl:  sucralfate (CARAFATE) 1 G tablet, Dissolve 1 tablet in 10 mL H20 and swallow 30 min prior to meals and bedtime. Fill if suspension Rx not affordable., Disp: 60 tablet, Rfl: 5 Current facility-administered medications:0.9 %  sodium chloride infusion, , Intravenous, Continuous, D Kevin Allred, PA-C, Last Rate: 50 mL/hr at 06/09/13 1201;  ceFAZolin (ANCEF) IVPB 2 g/50 mL premix, 2 g, Intravenous, On Call, D Rowe Robert, PA-C   Results for orders placed during the hospital encounter of 06/09/13 (from the past 48 hour(s))  APTT      Status: None   Collection Time    06/09/13 12:00 PM      Result Value Ref Range   aPTT 30  24 - 37 seconds  CBC     Status: Abnormal   Collection Time    06/09/13 12:00 PM      Result Value Ref Range   WBC 10.0  4.0 - 10.5 K/uL   RBC 5.54  4.22 - 5.81 MIL/uL   Hemoglobin 17.2 (*) 13.0 - 17.0 g/dL   HCT 48.5  39.0 - 52.0 %   MCV 87.5  78.0 - 100.0 fL   MCH 31.0  26.0 - 34.0 pg   MCHC 35.5  30.0 - 36.0 g/dL   RDW 13.3  11.5 - 15.5 %   Platelets 301  150 - 400 K/uL  PROTIME-INR     Status: None   Collection Time    06/09/13 12:00 PM      Result Value Ref Range   Prothrombin Time 12.6  11.6 - 15.2 seconds   INR 0.96  0.00 - 1.49   No results found.  Review of Systems  Constitutional: Positive for weight loss. Negative for fever and chills.  HENT:       Occ HA's; dysphagia  Respiratory: Negative for shortness of breath.        Occ cough  Cardiovascular: Negative for chest pain.  Gastrointestinal: Positive for abdominal pain.       Occ reflux, nausea, "burning" sensation lower chest/upper abdomen  Genitourinary: Negative for hematuria.  Musculoskeletal: Negative for back pain.  Endo/Heme/Allergies: Does not bruise/bleed easily.   Vitals: BP 156/93  HR 85  R 16  TEMP 97.8  O2 SATS 97% RA  Physical Exam  Constitutional: He is oriented to person, place, and time. He appears well-developed and well-nourished.  Cardiovascular: Normal rate and regular rhythm.   Respiratory: Effort normal and breath sounds normal.  GI: Soft. Bowel sounds are normal.  Mild epigastric tenderness  Musculoskeletal: Normal range of motion. He exhibits no edema.  Neurological: He is alert and oriented to person, place, and time.     Assessment/Plan Pt with stage 4 esophageal adenocarcinoma. Plan is for port a cath placement today for chemotherapy. Details/risks of procedure d/w pt with his understanding and consent.  D Lennette Bihari Allred 06/09/2013, 1:20 PM

## 2013-06-09 NOTE — Procedures (Signed)
Interventional Radiology Procedure Note  Procedure: Placement of a right IJ approach single lumen PowerPort.  Tip is positioned at the superior cavoatrial junction and catheter is ready for immediate use.  Complications: No immediate Recommendations:  - Ok to shower tomorrow - Do not submerge for 7 days - Routine line care   Signed,  Malin Cervini K. Paizleigh Wilds, MD [facility]   

## 2013-06-09 NOTE — Telephone Encounter (Signed)
, °

## 2013-06-09 NOTE — Addendum Note (Signed)
Encounter addended by: Eppie Gibson, MD on: 06/09/2013  3:39 PM<BR>     Documentation filed: Notes Section

## 2013-06-10 ENCOUNTER — Encounter: Payer: Self-pay | Admitting: Radiation Oncology

## 2013-06-10 ENCOUNTER — Telehealth: Payer: Self-pay | Admitting: Medical Oncology

## 2013-06-10 ENCOUNTER — Other Ambulatory Visit: Payer: BC Managed Care – PPO

## 2013-06-10 ENCOUNTER — Other Ambulatory Visit: Payer: Self-pay | Admitting: Medical Oncology

## 2013-06-10 ENCOUNTER — Ambulatory Visit
Admission: RE | Admit: 2013-06-10 | Discharge: 2013-06-10 | Disposition: A | Discharge status: Home / Self Care | Payer: BC Managed Care – PPO | Source: Ambulatory Visit | Attending: Radiation Oncology | Admitting: Radiation Oncology

## 2013-06-10 NOTE — Telephone Encounter (Signed)
Pt called and states that he had his porta cath placed yesterday. He states he did not sleep very well last night due to the pain. He is scheduled today at noon for a chemo class. He requested it be moved to later in the day. He has radiation late today. Per Dr. Juliann Mule pt will be receiving radiation for 2 weeks then he will start chemo. He sent a POF to cancel all chemo. I cancelled these appointments. I asked pt if he has any pain medication and he states yes. I told him he can take a dose but to make sure he does not drive. He states someone will be bringing him to his appointments. I will have pt's chemo class rescheduled to a later day in the week. He voiced understanding.

## 2013-06-10 NOTE — Progress Notes (Signed)
  Radiation Oncology         [phone] ________________________________  Name: Devin Gonzalez MRN: [id number]  Date: 06/10/2013  DOB: [birth year]  Simulation Verification Note  Status: outpatient  NARRATIVE: The patient was brought to the treatment unit and placed in the planned treatment position. The clinical setup was verified. Then port films were obtained and uploaded to the radiation oncology medical record software.  The treatment beams were carefully compared against the planned radiation fields. The position location and shape of the radiation fields was reviewed. They targeted volume of tissue appears to be appropriately covered by the radiation beams. Organs at risk appear to be excluded as planned.  Based on my personal review, I approved the simulation verification. The patient's treatment will proceed as planned.  -----------------------------------  Blair Promise, PhD, MD

## 2013-06-11 ENCOUNTER — Ambulatory Visit: Payer: BC Managed Care – PPO

## 2013-06-11 ENCOUNTER — Ambulatory Visit
Admission: RE | Admit: 2013-06-11 | Discharge: 2013-06-11 | Disposition: A | Discharge status: Home / Self Care | Payer: BC Managed Care – PPO | Source: Ambulatory Visit | Attending: Radiation Oncology | Admitting: Radiation Oncology

## 2013-06-11 NOTE — Progress Notes (Signed)
Pt in nursing c/o "itching and burning" over his right upper chest where he had port placed 2 days ago. Area remains bandaged. Spoke w/Jennifer , interventional radiology. She advised pt may remove dressing, shower and let water run over area, do not scrub area and let dermabond come off on it's own. Advised pt of Jennifer's instructions. Pt requested this RN remove bandage. Area clean, dry, intact w/dermabond intact. Re-educated pt on care of this area; pt verbalized understanding.

## 2013-06-12 ENCOUNTER — Encounter: Payer: BC Managed Care – PPO | Admitting: Cardiothoracic Surgery

## 2013-06-12 ENCOUNTER — Ambulatory Visit
Admission: RE | Admit: 2013-06-12 | Discharge: 2013-06-12 | Disposition: A | Discharge status: Home / Self Care | Payer: BC Managed Care – PPO | Source: Ambulatory Visit | Attending: Radiation Oncology | Admitting: Radiation Oncology

## 2013-06-12 ENCOUNTER — Telehealth: Payer: Self-pay | Admitting: Internal Medicine

## 2013-06-12 NOTE — Telephone Encounter (Signed)
s.w. pt and advised on new chemo edu time....pt ok adn aware

## 2013-06-13 ENCOUNTER — Ambulatory Visit
Admission: RE | Admit: 2013-06-13 | Discharge: 2013-06-13 | Disposition: A | Discharge status: Home / Self Care | Payer: BC Managed Care – PPO | Source: Ambulatory Visit | Attending: Radiation Oncology | Admitting: Radiation Oncology

## 2013-06-13 ENCOUNTER — Other Ambulatory Visit: Payer: Self-pay | Admitting: Radiation Oncology

## 2013-06-16 ENCOUNTER — Ambulatory Visit
Admission: RE | Admit: 2013-06-16 | Discharge: 2013-06-16 | Disposition: A | Discharge status: Home / Self Care | Payer: BC Managed Care – PPO | Source: Ambulatory Visit | Attending: Radiation Oncology | Admitting: Radiation Oncology

## 2013-06-16 ENCOUNTER — Encounter: Payer: Self-pay | Admitting: Radiation Oncology

## 2013-06-16 VITALS — BP 132/87 | HR 105 | Temp 98.5°F | Resp 20 | Wt 113.0 lb

## 2013-06-16 DIAGNOSIS — C155 Malignant neoplasm of lower third of esophagus: Secondary | ICD-10-CM

## 2013-06-16 MED ORDER — NICOTINE 7 MG/24HR TD PT24
7.0000 mg | MEDICATED_PATCH | Freq: Every day | TRANSDERMAL | Status: DC
Start: 2013-06-16 — End: 2013-07-16

## 2013-06-16 MED ORDER — NICOTINE 14 MG/24HR TD PT24
14.0000 mg | MEDICATED_PATCH | Freq: Every day | TRANSDERMAL | Status: DC
Start: 2013-06-16 — End: 2013-07-18

## 2013-06-16 MED ORDER — NICOTINE 21 MG/24HR TD PT24: 21.0000 mg | MEDICATED_PATCH | Freq: Every day | TRANSDERMAL | Status: DC

## 2013-06-16 NOTE — Progress Notes (Signed)
6.15.15:  Rec'd disability paperwork for [employer] - forward to RN for completion

## 2013-06-16 NOTE — Progress Notes (Signed)
Weekly Management Note:  outpatient Current Dose:  15 Gy  Projected Dose: 30 Gy   Narrative:  The patient presents for routine under treatment assessment.  CBCT/MVCT images/Port film x-rays were reviewed.  The chart was checked. He denies pain, loss of appetite; he does report acid reflux, nausea, constipation. He takes Sucralfate 4 times daily, and this helped GERD.    He reports blurry vision is getting worse, esp at night.  No HA.  Prune juice helps constipation. Still losing weight.   But, sees nutritionist again this week.  He has noted improvement already in terms of his dysphagia and was able to eat soft foods this weekend. Using PEG tube  Physical Findings:  weight is 113 lb (51.256 kg). His temperature is 98.5 F (36.9 C). His blood pressure is 132/87 and his pulse is 105. His respiration is 20.  NAD, ambulatory CBC    Component Value Date/Time   WBC 10.0 06/09/2013 1200   WBC 9.3 06/04/2013 0936   RBC 5.54 06/09/2013 1200   RBC 5.26 06/04/2013 0936   HGB 17.2* 06/09/2013 1200   HGB 16.1 06/04/2013 0936   HCT 48.5 06/09/2013 1200   HCT 47.7 06/04/2013 0936   PLT 301 06/09/2013 1200   PLT 280 06/04/2013 0936   MCV 87.5 06/09/2013 1200   MCV 90.7 06/04/2013 0936   MCH 31.0 06/09/2013 1200   MCH 30.6 06/04/2013 0936   MCHC 35.5 06/09/2013 1200   MCHC 33.7 06/04/2013 0936   RDW 13.3 06/09/2013 1200   RDW 13.9 06/04/2013 0936   LYMPHSABS 2.4 06/04/2013 0936   LYMPHSABS 2.9 05/13/2013 1719   MONOABS 1.1* 06/04/2013 0936   MONOABS 0.2 05/13/2013 1719   EOSABS 0.2 06/04/2013 0936   EOSABS 0.2 05/13/2013 1719   BASOSABS 0.0 06/04/2013 0936   BASOSABS 0.0 05/13/2013 1719     CMP     Component Value Date/Time   NA 143 06/04/2013 0937   NA 138 05/13/2013 1719   K 4.0 06/04/2013 0937   K 5.0 05/13/2013 1719   CL 103 05/13/2013 1719   CO2 20* 06/04/2013 0937   CO2 27 05/13/2013 1719   GLUCOSE 105 06/04/2013 0937   GLUCOSE 88 05/13/2013 1719   BUN 6.6* 06/04/2013 0937   BUN 10 05/13/2013 1719   CREATININE 0.8 06/04/2013 0937   CREATININE 1.0 05/13/2013 1719   CALCIUM 9.4 06/04/2013 0937   CALCIUM 9.1 05/13/2013 1719   PROT 7.5 06/04/2013 0937   PROT 7.4 05/13/2013 1719   ALBUMIN 3.6 06/04/2013 0937   ALBUMIN 3.8 05/13/2013 1719   AST 38* 06/04/2013 0937   AST 34 05/13/2013 1719   ALT 37 06/04/2013 0937   ALT 37 05/13/2013 1719   ALKPHOS 74 06/04/2013 0937   ALKPHOS 70 05/13/2013 1719   BILITOT 0.52 06/04/2013 0937   BILITOT 1.1 05/13/2013 1719   GFRNONAA >90 12/13/2011 0927   GFRAA >90 12/13/2011 5993     Impression:  The patient is tolerating radiotherapy.   Plan:  Continue radiotherapy as planned. Will obtain MRI brain to r/o brain mets due to worsening vision.  The patient continues to use tobacco. The patient would like to stop using tobacco and was offered pharmacotherapy to help with this. The patient would like to accept a Rx for nicotine patch pharmacotherapy - this Rx was made today.  He will continue sucralfate for GERD.  Instructed on how to use antiemetics.  Instructed to drink prune juice daily +/- stool softeners for constipation. Sees  Lurline Del this week for nutrition.   -----------------------------------  Eppie Gibson, MD

## 2013-06-16 NOTE — Progress Notes (Signed)
Patient denies pain, loss of appetite; he does report acid reflux, nausea, constipation. He takes Sucralfate 4 times daily, has three meds for nausea that were reviewed with him today. He drinks prune juice for constipation but states after he has BM he "feels drained". Advised he take stool softeners for constipation. Pt has seen nutritionist. Post sim ed completed w/pt. Gave pt "Radiation and You" booklet w/all pertinent information marked and discussed, re: diarrhea, fatigue, nausea/vomiting/management, throat irritation/care, skin irritation/care, nutrition, pain. Pt verbalized understanding.

## 2013-06-17 ENCOUNTER — Ambulatory Visit
Admission: RE | Admit: 2013-06-17 | Discharge: 2013-06-17 | Disposition: A | Discharge status: Home / Self Care | Payer: BC Managed Care – PPO | Source: Ambulatory Visit | Attending: Radiation Oncology | Admitting: Radiation Oncology

## 2013-06-17 ENCOUNTER — Telehealth: Payer: Self-pay | Admitting: *Deleted

## 2013-06-17 ENCOUNTER — Other Ambulatory Visit: Payer: BC Managed Care – PPO

## 2013-06-17 ENCOUNTER — Encounter: Payer: Self-pay | Admitting: *Deleted

## 2013-06-17 ENCOUNTER — Other Ambulatory Visit: Payer: Self-pay | Admitting: Medical Oncology

## 2013-06-17 MED ORDER — LIDOCAINE-PRILOCAINE 2.5-2.5 % EX CREA: TOPICAL_CREAM | CUTANEOUS | Status: DC

## 2013-06-17 NOTE — Telephone Encounter (Signed)
Called patient to inform of test, lvm for a return call 

## 2013-06-18 ENCOUNTER — Ambulatory Visit
Admission: RE | Admit: 2013-06-18 | Discharge: 2013-06-18 | Disposition: A | Discharge status: Home / Self Care | Payer: BC Managed Care – PPO | Source: Ambulatory Visit | Attending: Radiation Oncology | Admitting: Radiation Oncology

## 2013-06-18 ENCOUNTER — Other Ambulatory Visit: Payer: Self-pay | Admitting: Internal Medicine

## 2013-06-18 ENCOUNTER — Encounter: Payer: Self-pay | Admitting: Internal Medicine

## 2013-06-18 ENCOUNTER — Ambulatory Visit: Payer: BC Managed Care – PPO

## 2013-06-18 ENCOUNTER — Telehealth: Payer: Self-pay | Admitting: *Deleted

## 2013-06-18 ENCOUNTER — Encounter: Payer: BC Managed Care – PPO | Admitting: Nutrition

## 2013-06-18 ENCOUNTER — Ambulatory Visit (HOSPITAL_BASED_OUTPATIENT_CLINIC_OR_DEPARTMENT_OTHER): Payer: BC Managed Care – PPO | Admitting: Internal Medicine

## 2013-06-18 ENCOUNTER — Other Ambulatory Visit: Payer: BC Managed Care – PPO

## 2013-06-18 ENCOUNTER — Other Ambulatory Visit (HOSPITAL_BASED_OUTPATIENT_CLINIC_OR_DEPARTMENT_OTHER): Payer: BC Managed Care – PPO

## 2013-06-18 ENCOUNTER — Ambulatory Visit: Payer: BC Managed Care – PPO | Admitting: Nutrition

## 2013-06-18 VITALS — BP 148/80 | HR 94 | Temp 98.1°F | Resp 18 | Ht 64.0 in | Wt 113.8 lb

## 2013-06-18 DIAGNOSIS — C7951 Secondary malignant neoplasm of bone: Secondary | ICD-10-CM

## 2013-06-18 DIAGNOSIS — C7952 Secondary malignant neoplasm of bone marrow: Secondary | ICD-10-CM

## 2013-06-18 DIAGNOSIS — M949 Disorder of cartilage, unspecified: Secondary | ICD-10-CM

## 2013-06-18 DIAGNOSIS — C786 Secondary malignant neoplasm of retroperitoneum and peritoneum: Secondary | ICD-10-CM

## 2013-06-18 DIAGNOSIS — M899 Disorder of bone, unspecified: Secondary | ICD-10-CM

## 2013-06-18 DIAGNOSIS — C155 Malignant neoplasm of lower third of esophagus: Secondary | ICD-10-CM

## 2013-06-18 DIAGNOSIS — C781 Secondary malignant neoplasm of mediastinum: Secondary | ICD-10-CM

## 2013-06-18 LAB — COMPREHENSIVE METABOLIC PANEL (CC13)
ALBUMIN: 3.4 g/dL — AB (ref 3.5–5.0)
ALK PHOS: 69 U/L (ref 40–150)
ALT: 19 U/L (ref 0–55)
AST: 25 U/L (ref 5–34)
Anion Gap: 11 mEq/L (ref 3–11)
BUN: 9.8 mg/dL (ref 7.0–26.0)
CALCIUM: 9.5 mg/dL (ref 8.4–10.4)
CO2: 27 mEq/L (ref 22–29)
Chloride: 104 mEq/L (ref 98–109)
Creatinine: 0.9 mg/dL (ref 0.7–1.3)
Glucose: 115 mg/dl (ref 70–140)
POTASSIUM: 3.8 meq/L (ref 3.5–5.1)
Sodium: 142 mEq/L (ref 136–145)
Total Bilirubin: 0.33 mg/dL (ref 0.20–1.20)
Total Protein: 7 g/dL (ref 6.4–8.3)

## 2013-06-18 LAB — CBC WITH DIFFERENTIAL/PLATELET
BASO%: 0.4 % (ref 0.0–2.0)
Basophils Absolute: 0 10*3/uL (ref 0.0–0.1)
EOS%: 2.3 % (ref 0.0–7.0)
Eosinophils Absolute: 0.2 10*3/uL (ref 0.0–0.5)
HEMATOCRIT: 44 % (ref 38.4–49.9)
HGB: 15.3 g/dL (ref 13.0–17.1)
LYMPH%: 16.6 % (ref 14.0–49.0)
MCH: 30.1 pg (ref 27.2–33.4)
MCHC: 34.8 g/dL (ref 32.0–36.0)
MCV: 86.6 fL (ref 79.3–98.0)
MONO#: 0.8 10*3/uL (ref 0.1–0.9)
MONO%: 12 % (ref 0.0–14.0)
NEUT#: 4.7 10*3/uL (ref 1.5–6.5)
NEUT%: 68.7 % (ref 39.0–75.0)
Platelets: 273 10*3/uL (ref 140–400)
RBC: 5.08 10*6/uL (ref 4.20–5.82)
RDW: 12.9 % (ref 11.0–14.6)
WBC: 6.9 10*3/uL (ref 4.0–10.3)
lymph#: 1.1 10*3/uL (ref 0.9–3.3)
nRBC: 0 % (ref 0–0)

## 2013-06-18 LAB — LACTATE DEHYDROGENASE (CC13): LDH: 157 U/L (ref 125–245)

## 2013-06-18 NOTE — Progress Notes (Signed)
Patient seeking financial asst. He does have [employer], but not sure how much longer. He is not working so can't pay for it. He applied for medicaid and was denied( I will see about reason and appeal). He has applied for disability. He will bring me back letter of support for 400.00 grant. He will call billing also about possible hardship. He says he has bill over 5000.00.

## 2013-06-18 NOTE — Progress Notes (Signed)
Nutrition followup completed with patient.  Weight documented as 113 pounds June 15 decreased from 117 pounds June 8.  Patient states he has been blenderizing foods.  It appears he is blenderizing a good variety of foods.  He drinks 2-3 boost plus daily.  Prune juices controls constipation. Noted final radiation therapy on June 22.  Patient to begin chemotherapy. Patient remains without a feeding tube.  Nutrition diagnosis: Inadequate oral intake improved.  Intervention: Patient educated to continue high-calorie, high-protein, pured foods as tolerated.  Patient educated to increase boost plus to a minimum of 3 times a day.  Strive for adding calories to pured foods.  Encouraged more frequent meals and snacks.  Provided oral nutrition supplement samples.  Provided fact sheets and coupons.  Questions were answered.  Teach back method used.  Monitoring, evaluation, goals: Patient will tolerate increased calories and protein along with oral nutrition supplements to minimize further weight loss.  Next visit: Patient to contact me for questions or concerns.  Will attempt to followup with patient while he is here for chemotherapy.

## 2013-06-18 NOTE — Telephone Encounter (Signed)
CALLED PATIENT TO INFORM OF TEST FOR 06-19-13 @ [facility], LVM FOR A  RETURN CALL

## 2013-06-19 ENCOUNTER — Ambulatory Visit
Admission: RE | Admit: 2013-06-19 | Discharge: 2013-06-19 | Disposition: A | Discharge status: Home / Self Care | Payer: BC Managed Care – PPO | Source: Ambulatory Visit | Attending: Radiation Oncology | Admitting: Radiation Oncology

## 2013-06-19 ENCOUNTER — Encounter: Payer: Self-pay | Admitting: Internal Medicine

## 2013-06-19 DIAGNOSIS — C155 Malignant neoplasm of lower third of esophagus: Secondary | ICD-10-CM

## 2013-06-19 DIAGNOSIS — C7931 Secondary malignant neoplasm of brain: Secondary | ICD-10-CM

## 2013-06-19 HISTORY — DX: Secondary malignant neoplasm of brain: C79.31

## 2013-06-19 MED ORDER — GADOBENATE DIMEGLUMINE 529 MG/ML IV SOLN
10.0000 mL | Freq: Once | INTRAVENOUS | Status: AC | PRN
  Administered 2013-06-19: 10 mL via INTRAVENOUS

## 2013-06-19 NOTE — Addendum Note (Signed)
Encounter addended by: Deirdre Evener, RN on: 06/19/2013 10:52 AM<BR>     Documentation filed: Charges VN

## 2013-06-19 NOTE — Progress Notes (Signed)
Advised the patient must have a stmt from [employer] union with address and break down of his amt due so we can pay 300.00 for him from the [place]. He will have them fax or email me copy.

## 2013-06-19 NOTE — Progress Notes (Signed)
[facility] OFFICE PROGRESS NOTE  [name], MD [place] [address]  DIAGNOSIS: Malignant neoplasm of lower third of esophagus - Plan: CBC with Differential, Basic metabolic panel (Bmet) - [facility]  Bone metastases  Chief Complaint  Patient presents with  . Malignant neoplasm of lower third of esophagus    CURRENT TREATMENT:   Planning to start (ECF) consisting of  Epirubicin 50 mg/m2 IV, Cisplatin 60 mg/m2 IV on day #1, Fluorouracil 200 mg/m2 per day IV continuous infusion over 24 hours daily on days 1-21 cycled every 21 days for six months on 06/27/2013.  He started palliative XRT to esophageal mass on 06/09/2013 per Dr. Eppie Gibson.   INTERVAL HISTORY: Devin Gonzalez 58 y.o. male with a history of newly diagnosed metastatic GEJ adenocarcinoma is here for follow up.  He was seen by me on 06/04/2013.    As previously reported, he initially was evaluated by Dr. Osborne Casco in one or two weeks prior to his visit with Dr. Carlean Purl on 05/12/2013. He was having a "acid reflux" burning sensation and it bothered him at night. He also noted feeling as if his foods was feeling stuck. Due to persistence in these symptoms including odynophagia, reflux and solid dysphagia with weight loss (he lost 16 lbs over the past few months), her was referred to Dr. Carlean Purl. Dr. Carlean Purl scheduled him for an EGD on 05/13/2013. Of note, he had been on PPI bid since January with minimal improvement in symptoms. He reported a longstanding smoking history for the past 20 plus years. He smokes about 0.5 packs per day. He drinks 3-4 beers per day (every other day) for the past 15 plus years. He had an EGD which showed circumferential mass at the gastroesophageal junction. Multiple biopsies were obtained and it were determined to esophageal adenocarcinoma without amplification of HER-2 detected. He had a CT chest and abdomen and pelvis on 05/19/2013, which showed a mass of 4.6 cm along the GE  junction. The esophagus was dilated above this mass. There was a suspected 12 mm lymph node near the GE junction. There was also bulky gastrohepatic ligament adenopathy noted with combined lymph nodes measuring up to 3 cm. There was also borderdline celiac axis adenopathy all suspicious for metastatic disease. There were no findings suggestive of  hepatic metastatic disease, osseous metastatic disease or lung disease.   Today, he reports some improvement is his dsyphagia.  He is scheduled to finish on 06/23/2013.  He reports visual changes and is scheduled for an MRI of brain on 06/19/2013.   He denies melana or hematochezia.   MEDICAL HISTORY: Past Medical History  Diagnosis Date  . Allergy   . Hemorrhoids   . Back pain     right side  . Bunion, right foot   . Left tennis elbow   . Osteoarthritis     hands, knees, right hip  . Prostatitis   . Diverticulosis 2013  . Adenomatous colon polyp   . ED (erectile dysfunction)   . Tobacco abuse   . Reflux esophagitis   . Nonspecific elevation of levels of transaminase or lactic acid dehydrogenase (LDH)     INTERIM HISTORY: has Internal hemorrhoids with complication; Personal history of colonic polyps; Post-op pain; Anal pain; Malignant neoplasm of lower third of esophagus; and Bone metastases on his problem list.    ALLERGIES:  has No Known Allergies.  MEDICATIONS: has a current medication list which includes the following prescription(s): dexamethasone, diphenhydramine hcl, lidocaine-prilocaine, lorazepam,  montelukast, nicotine, nicotine, nicotine, ondansetron, oxycodone, prochlorperazine, sucralfate, sucralfate, and tetrahydrozoline hcl.  SURGICAL HISTORY:  Past Surgical History  Procedure Laterality Date  . Cyst on testicle Right   . Appendectomy    . Tonsillectomy    . Knee surgery Bilateral     arthroscopic surg /  bil knees  . Fatty tumor Right     removal forearm  . Colonoscopy w/ polypectomy    . Sphincterotomy  08/28/2011     Procedure: SPHINCTEROTOMY;  Surgeon: Joyice Faster. Cornett, MD;  Location: [facility];  Service: General;  Laterality: N/A;  Lateral internal sphincterotomy  . Hemorrhoid surgery  08/28/2011    Procedure: HEMORRHOIDECTOMY;  Surgeon: Joyice Faster. Cornett, MD;  Location: [facility];  Service: General;  Laterality: N/A;  ,possible hemorrhoidectomy  . Examination under anesthesia  12/13/2011    Procedure: EXAM UNDER ANESTHESIA;  Surgeon: Marcello Moores A. Cornett, MD;  Location: [facility];  Service: General;  Laterality: N/A;  . Anal fissure repair  2013    Cornett    REVIEW OF SYSTEMS:   Constitutional: Denies fevers, chills or abnormal weight loss Eyes: Denies blurriness of vision Ears, nose, mouth, throat, and face: Denies mucositis or sore throat; He denies hearing lost.  Respiratory: Denies cough, dyspnea or wheezes Cardiovascular: Denies palpitation, chest discomfort or lower extremity swelling Gastrointestinal:  Denies nausea, heartburn or change in bowel habits; as noted in HPI.  Skin: Denies abnormal skin rashes Lymphatics: Denies new lymphadenopathy or easy bruising Neurological:Denies numbness, tingling or new weaknesses Behavioral/Psych: Mood is stable, no new changes  All other systems were reviewed with the patient and are negative.  PHYSICAL EXAMINATION: ECOG PERFORMANCE STATUS: 0 - Asymptomatic  Blood pressure 148/80, pulse 94, temperature 98.1 F (36.7 C), temperature source Oral, resp. rate 18, height 5' 4"  (1.626 m), weight 113 lb 12.8 oz (51.619 kg).  GENERAL:alert, no distress and comfortable SKIN: skin color, texture, turgor are normal, no rashes or significant lesions; R port a cath (placed on 06/09/2013). EYES: normal, Conjunctiva are pink and non-injected, sclera clear OROPHARYNX:no exudate, no erythema and lips, buccal mucosa, and tongue normal  NECK: supple, thyroid normal size, non-tender, without nodularity LYMPH:  no palpable lymphadenopathy in the cervical, axillary or  supraclavicular LUNGS: clear to auscultation with normal breathing effort, no wheezes or rhonchi HEART: regular rate & rhythm and no murmurs and no lower extremity edema ABDOMEN:abdomen soft, non-tender and normal bowel sounds Musculoskeletal:no cyanosis of digits and no clubbing  NEURO: alert & oriented x 3 with fluent speech, no focal motor/sensory deficits  Labs:  Lab Results  Component Value Date   WBC 6.9 06/18/2013   HGB 15.3 06/18/2013   HCT 44.0 06/18/2013   MCV 86.6 06/18/2013   PLT 273 06/18/2013   NEUTROABS 4.7 06/18/2013      Chemistry      Component Value Date/Time   NA 142 06/18/2013 0816   NA 138 05/13/2013 1719   K 3.8 06/18/2013 0816   K 5.0 05/13/2013 1719   CL 103 05/13/2013 1719   CO2 27 06/18/2013 0816   CO2 27 05/13/2013 1719   BUN 9.8 06/18/2013 0816   BUN 10 05/13/2013 1719   CREATININE 0.9 06/18/2013 0816   CREATININE 1.0 05/13/2013 1719      Component Value Date/Time   CALCIUM 9.5 06/18/2013 0816   CALCIUM 9.1 05/13/2013 1719   ALKPHOS 69 06/18/2013 0816   ALKPHOS 70 05/13/2013 1719   AST 25 06/18/2013 0816   AST 34 05/13/2013 1719  ALT 19 06/18/2013 0816   ALT 37 05/13/2013 1719   BILITOT 0.33 06/18/2013 0816   BILITOT 1.1 05/13/2013 1719       Basic Metabolic Panel:  Recent Labs Lab 06/18/13 0816  NA 142  K 3.8  CO2 27  GLUCOSE 115  BUN 9.8  CREATININE 0.9  CALCIUM 9.5   GFR Estimated Creatinine Clearance: 66.9 ml/min (by C-G formula based on Cr of 0.9). Liver Function Tests:  Recent Labs Lab 06/18/13 0816  AST 25  ALT 19  ALKPHOS 69  BILITOT 0.33  PROT 7.0  ALBUMIN 3.4*   No results found for this basename: LIPASE, AMYLASE,  in the last 168 hours No results found for this basename: AMMONIA,  in the last 168 hours Coagulation profile No results found for this basename: INR, PROTIME,  in the last 168 hours  CBC:  Recent Labs Lab 06/18/13 0816  WBC 6.9  NEUTROABS 4.7  HGB 15.3  HCT 44.0  MCV 86.6  PLT 273    Anemia work  up No results found for this basename: VITAMINB12, FOLATE, FERRITIN, TIBC, IRON, RETICCTPCT,  in the last 72 hours  Studies:   RADIOGRAPHIC STUDIES: 06-17-2013   CLINICAL DATA:  Initial treatment strategy for esophageal carcinoma.  EXAM: NUCLEAR MEDICINE PET SKULL BASE TO THIGH  TECHNIQUE: 6.7 mCi F-18 FDG was injected intravenously. Full-ring PET imaging was performed from the skull base to thigh after the radiotracer. CT data was obtained and used for attenuation correction and anatomic localization.  FASTING BLOOD GLUCOSE:  Value: 96 mg/dl  COMPARISON:  CT on 05/19/2013  FINDINGS: NECK  No hypermetabolic lymph nodes in the neck.  CHEST  Primary hypermetabolic mass is seen in the distal thoracic esophagus which has a maximum SUV of 19.9, consistent with primary esophageal carcinoma.  Small hypermetabolic paraesophageal lymph nodes are also seen within the middle mediastinum superior to this mass extending to the level of the carina. A 1.2 cm hypermetabolic mediastinal lymph node is seen in the AP window which has a SUV max of 12.2, and a 1 cm hypermetabolic lymph node is also seen in the high right paratracheal region on image 48, which has an SUV max of 3.1.  No hypermetabolic hilar lymph nodes are seen. Mild emphysema noted, but no suspicious pulmonary nodules are identified. In addition, 2 intramuscular foci of hypermetabolic activity are seen within the right shoulder girdle on images 37 and 53, which are highly suspicious for intramuscular metastases.  ABDOMEN/PELVIS  No abnormal hypermetabolic activity within the liver, pancreas, adrenal glands, or spleen.  Hypermetabolic lymphadenopathy is seen in the gastrohepatic ligament, with SUV max of 11.1. A sub-cm hypermetabolic retroperitoneal lymph node is also seen adjacent to the IVC on image 117. No hypermetabolic soft tissue masses or lymphadenopathy identified within the pelvis.  SKELETON  Two hypermetabolic foci are seen within the pelvis in the  bilateral iliac bones, suspicious for bone metastases.  IMPRESSION: Hypermetabolic distal esophageal mass, consistent with primary esophageal carcinoma.  Metastatic lymphadenopathy in mediastinum, gastrohepatic ligament, and right abdominal retroperitoneum.  Two intramuscular hypermetabolic foci within right shoulder for a girl, highly suspicious for intramuscular metastases.  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   Electronically Signed   By: Earle Gell M.D.   On: 17-Jun-2013 15:50   Echo (06/06/2013) Study Conclusions  - Left ventricle: The cavity size was normal. Wall thickness was normal. Systolic function was normal. The estimated ejection fraction was in the range of 55% to 60%. Wall motion was normal; there  were no regional wall motion abnormalities. - Mitral valve: There was mild regurgitation.  Impressions:  - Normal LV function; mild MR; diastolic function and strain not performed.    ASSESSMENT: Devin Gonzalez 58 y.o. male with a history of Malignant neoplasm of lower third of esophagus - Plan: CBC with Differential, Basic metabolic panel (Bmet) - [facility]  Bone metastases   PLAN:   1. Newly diagnosed metastatic esophageal adenocarcinoma (GE junction), Stage IV --We reviewed his scans and pathology extensively consistent with the above. CT of chest and abdomen showed a long segment distal esophageal mass extending right to the GE junction that was approximately the 4.6 cm along with enlarged aorticopulmonary window lymph node, GE junction lymph node, gastrohepatic ligament adenopathy and borderline celiac axis adenopathy all suspicious for metastatic disease likely Stage IV.  We obtained a PET to see which nodes express FDG avidity and to determine if preoperative chemotherapy or chemoradiation prior to esophagectomy or definitive chemoradiation (given his good functional status) is appropriate. His Her2 was negative. His PET confirmed metastatic lymphadenopathy in mediastinum,  gastrohepatic ligament, and right abdominal retroperitoneum.  Two intramuscular hypermetabolic foci within right shoulder for a girl, highly suspicious for intramuscular metastases.  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    Based on care and in NCCN guidelines version 3.2015 for Esophageal and esophagogastric jucntion cancers for metastatic systemic therapies, first line therapy with ECF is catagory 1 preferred regimens for medically fit patients with good PS and access to frequent toxicity evaluation.   Given his good functional status and lack of neurotoxicity, nephrotoxicity or hearing problems, we recommended systemic chemotherapy in the form of ECF (epirubicin, cisplatin, and fluorouracil).   We then discussed the indications for treatment would palliation for his cancer which is not curable given his PET findings, and the  benefits, and risks of chemotherapy in detail. The risks includes but is not limited to myelotoxicity, renal dysfunction, leukocytopenia or diarrhea, neurotoxicity, palmar plantar erythrodysestesiasis, hearing toxicity and other toxicities. He consented and agreed to proceed with chemotherapy understanding these risks and benefits. He will proceed with chemotherapy on next week  unless additional XRT is required with the following regimen:   --Epirubicin 50 mg per meter squared IV on day one  --Cisplatin 60 mg per meter squared IV on day one  --Fluorouracil 200 mg per meter squared IV continuous infusion over 24 hours daily on days 1 through 21   We will plan on 6-8 cycles of treatment or unacceptable toxicity.  References are as follows: Clint Lipps, et. Al, NEJM 3403;709:64 and Sumpter K, et al. Br J Cancer 2005; 38:3818.     --We will monitor CBC with differential and platelet count day 1 prior to these treatment cycle. We will assess BMP including creatinine, liver function test once per cycle on day 1 prior to each treatment cycle. We will monitor for neurotoxicity prior CTs  treatment cycle. We'll also monitor for hearing loss in the clinic prior to each dose of cisplatin. A baseline hearing test has been obtained. We assessed for left ventricle and ejection fraction with an echocardiogram prior to start of chemotherapy and is as noted above. We had a Port-a-cath placed on 06/09/2013.    Prophylaxis with G-CSF is not warranted with the incidence of grade 3 or 4 febrile neutropenia is at 9%. This chemotherapy regimen is at high emesis risk so therefore he was provided with anti-emetics including Compazine, dexamethasone, Zofran and Ativan. He has been referred for nutrient evaluation.  --Given his  dysphagia and weight lost, he saw Dr. Isidore Moos in consideration of focal palliative XRT to his esophogeal mass. He was started on 06/08 and will finish on 06/22.     --He has evidence of bone metastases, and bisphosphonate therapy will also be considered.   2. Visual Changes. --He is scheduled for an MRI of brain tomorrow to rule out metastatic disease.    3. Tobacco Abuse.  --Referral to tobacco cessation classes were made last visit   4. Follow up.  --Patient will follow up in 1 week with for a symptom visit and in consideration of chemotherapy.    All questions were answered. The patient knows to call the clinic with any problems, questions or concerns. We can certainly see the patient much sooner if necessary.  I spent 15 minutes counseling the patient face to face. The total time spent in the appointment was 25 minutes.    CHISM, DAVID, MD 06/19/2013 12:48 PM

## 2013-06-20 ENCOUNTER — Ambulatory Visit
Admission: RE | Admit: 2013-06-20 | Discharge: 2013-06-20 | Disposition: A | Discharge status: Home / Self Care | Payer: BC Managed Care – PPO | Source: Ambulatory Visit | Attending: Radiation Oncology | Admitting: Radiation Oncology

## 2013-06-20 ENCOUNTER — Telehealth: Payer: Self-pay | Admitting: *Deleted

## 2013-06-20 ENCOUNTER — Encounter: Payer: Self-pay | Admitting: Radiation Oncology

## 2013-06-20 NOTE — Progress Notes (Signed)
Informed patient of MRI Brain results.  There is a very small 27mm lesion consistent with a metastasis. It is probably asymptomatic, and doesn't explain his visual symptoms.  We will talk further in person on Monday about observation vs SRS to this. -----------------------------------  Eppie Gibson, MD

## 2013-06-20 NOTE — Telephone Encounter (Signed)
I have adjusted 6/24 appt

## 2013-06-23 ENCOUNTER — Telehealth: Payer: Self-pay

## 2013-06-23 ENCOUNTER — Encounter: Payer: Self-pay | Admitting: Radiation Oncology

## 2013-06-23 ENCOUNTER — Ambulatory Visit
Admission: RE | Admit: 2013-06-23 | Discharge: 2013-06-23 | Disposition: A | Discharge status: Home / Self Care | Payer: BC Managed Care – PPO | Source: Ambulatory Visit | Attending: Radiation Oncology | Admitting: Radiation Oncology

## 2013-06-23 ENCOUNTER — Telehealth: Payer: Self-pay | Admitting: *Deleted

## 2013-06-23 ENCOUNTER — Other Ambulatory Visit: Payer: Self-pay | Admitting: Internal Medicine

## 2013-06-23 VITALS — BP 152/90 | HR 88 | Temp 99.1°F | Wt 114.0 lb

## 2013-06-23 DIAGNOSIS — C155 Malignant neoplasm of lower third of esophagus: Secondary | ICD-10-CM

## 2013-06-23 MED ORDER — RANITIDINE HCL 150 MG PO TABS: 150.0000 mg | ORAL_TABLET | Freq: Two times a day (BID) | ORAL | Status: DC

## 2013-06-23 NOTE — Telephone Encounter (Signed)
lvm change of chemo infusion from 6/24 to 6/25 due to length of infusion

## 2013-06-23 NOTE — Progress Notes (Signed)
Weekly assessment of radiation to esophagus.completed 10 of 10 treatments.Continues to have burning on swallowing despite taking carafate.Apprehensive about chemotherapy and possible side effects.Encouraged  him to be positive as not everyone responds the same.Patient already has medications he will need to offset nausea.Weight down 3 lbs since start of radiation. Given appointment card to schedule follow up with Dr.Squire in one month.

## 2013-06-23 NOTE — Telephone Encounter (Signed)
Per desk RN I have moved treatment appt from 6/24 to 6/25. Also changed treatment length. Desk RN to contact patient.

## 2013-06-23 NOTE — Progress Notes (Signed)
   Weekly Management Note  outpatient  Esophageal cancer  Completed Radiotherapy. Total Dose: 30 Gy to esophageal tumor  Narrative:  The patient presents for routine under treatment assessment on last day of radiotherapy.  CBCT/MVCT images/Port film x-rays were reviewed.  The chart was checked. Swallowing is better, but GERD still painful.  Physical Findings:  weight is 114 lb (51.71 kg). His temperature is 99.1 F (37.3 C). His blood pressure is 152/90 and his pulse is 88. His oxygen saturation is 100%.   NAD, no skin irritation over torso  MRI HEAD WITHOUT AND WITH CONTRAST  TECHNIQUE:  Multiplanar, multiecho pulse sequences of the brain and surrounding  structures were obtained without and with intravenous contrast.  CONTRAST: 12mL MULTIHANCE GADOBENATE DIMEGLUMINE 529 MG/ML IV SOLN  COMPARISON: None.  FINDINGS:  There is no acute infarct. There is mild generalized cerebral  atrophy. There are a few small foci of T2 hyperintensity within the  deep cerebral white matter bilaterally, nonspecific and not greater  than expected for patient's age. There is a 3 mm focus of cortical  enhancement in the right superior frontal gyrus (series 10, image  51). No other abnormal enhancement is identified. There is no  midline shift, intracranial hemorrhage, or extra-axial fluid  collection.  Orbits are unremarkable. There is mild-to-moderate right maxillary  sinus mucosal thickening. Mastoid air cells are clear. Major  intracranial vascular flow voids are preserved.  IMPRESSION:  Single 3 mm enhancing lesion in the right frontal lobe, concerning  for solitary metastasis.  Impression:  The patient has tolerated radiotherapy.  Plan:  Routine follow-up in 6 wks. Rx ranitidine for heartburn.  Discussed MRI brain results.  42mm lesion is asymptomatic - I think that it is not urgent to treat now.  Proceed with chemo, rescan brain in ~ 6 wks, and at that time consider SRS to that lesion +/- any  new lesions that may appear in the interim. Alternative would be treatment in near future. Pt comfortable waiting until 6wk scan. ________________________________   Eppie Gibson, M.D.

## 2013-06-24 ENCOUNTER — Other Ambulatory Visit: Payer: Self-pay | Admitting: Radiation Therapy

## 2013-06-24 DIAGNOSIS — C7949 Secondary malignant neoplasm of other parts of nervous system: Principal | ICD-10-CM

## 2013-06-24 DIAGNOSIS — C7931 Secondary malignant neoplasm of brain: Secondary | ICD-10-CM

## 2013-06-25 ENCOUNTER — Encounter: Payer: Self-pay | Admitting: Internal Medicine

## 2013-06-25 ENCOUNTER — Ambulatory Visit (HOSPITAL_BASED_OUTPATIENT_CLINIC_OR_DEPARTMENT_OTHER): Payer: BC Managed Care – PPO | Admitting: Internal Medicine

## 2013-06-25 ENCOUNTER — Other Ambulatory Visit (HOSPITAL_BASED_OUTPATIENT_CLINIC_OR_DEPARTMENT_OTHER): Payer: BC Managed Care – PPO

## 2013-06-25 ENCOUNTER — Telehealth: Payer: Self-pay | Admitting: Internal Medicine

## 2013-06-25 ENCOUNTER — Ambulatory Visit: Payer: BC Managed Care – PPO

## 2013-06-25 ENCOUNTER — Telehealth: Payer: Self-pay | Admitting: *Deleted

## 2013-06-25 VITALS — BP 148/75 | HR 91 | Temp 98.5°F | Resp 18 | Ht 64.0 in | Wt 112.0 lb

## 2013-06-25 DIAGNOSIS — C155 Malignant neoplasm of lower third of esophagus: Secondary | ICD-10-CM

## 2013-06-25 DIAGNOSIS — C7951 Secondary malignant neoplasm of bone: Secondary | ICD-10-CM

## 2013-06-25 DIAGNOSIS — C7931 Secondary malignant neoplasm of brain: Secondary | ICD-10-CM | POA: Insufficient documentation

## 2013-06-25 DIAGNOSIS — C7952 Secondary malignant neoplasm of bone marrow: Secondary | ICD-10-CM

## 2013-06-25 DIAGNOSIS — C7949 Secondary malignant neoplasm of other parts of nervous system: Secondary | ICD-10-CM

## 2013-06-25 DIAGNOSIS — R1310 Dysphagia, unspecified: Secondary | ICD-10-CM

## 2013-06-25 DIAGNOSIS — K219 Gastro-esophageal reflux disease without esophagitis: Secondary | ICD-10-CM

## 2013-06-25 DIAGNOSIS — H539 Unspecified visual disturbance: Secondary | ICD-10-CM

## 2013-06-25 DIAGNOSIS — H538 Other visual disturbances: Secondary | ICD-10-CM

## 2013-06-25 DIAGNOSIS — F17200 Nicotine dependence, unspecified, uncomplicated: Secondary | ICD-10-CM

## 2013-06-25 LAB — CBC WITH DIFFERENTIAL/PLATELET
BASO%: 0.6 % (ref 0.0–2.0)
Basophils Absolute: 0 10*3/uL (ref 0.0–0.1)
EOS ABS: 0.1 10*3/uL (ref 0.0–0.5)
EOS%: 2 % (ref 0.0–7.0)
HEMATOCRIT: 46.6 % (ref 38.4–49.9)
HGB: 15.4 g/dL (ref 13.0–17.1)
LYMPH%: 10.4 % — AB (ref 14.0–49.0)
MCH: 30 pg (ref 27.2–33.4)
MCHC: 33.1 g/dL (ref 32.0–36.0)
MCV: 90.6 fL (ref 79.3–98.0)
MONO#: 0.9 10*3/uL (ref 0.1–0.9)
MONO%: 12.6 % (ref 0.0–14.0)
NEUT#: 5.3 10*3/uL (ref 1.5–6.5)
NEUT%: 74.4 % (ref 39.0–75.0)
PLATELETS: 268 10*3/uL (ref 140–400)
RBC: 5.15 10*6/uL (ref 4.20–5.82)
RDW: 13.2 % (ref 11.0–14.6)
WBC: 7.1 10*3/uL (ref 4.0–10.3)
lymph#: 0.7 10*3/uL — ABNORMAL LOW (ref 0.9–3.3)

## 2013-06-25 LAB — BASIC METABOLIC PANEL (CC13)
Anion Gap: 9 mEq/L (ref 3–11)
BUN: 9 mg/dL (ref 7.0–26.0)
CO2: 30 meq/L — AB (ref 22–29)
CREATININE: 1.1 mg/dL (ref 0.7–1.3)
Calcium: 9.7 mg/dL (ref 8.4–10.4)
Chloride: 104 mEq/L (ref 98–109)
GLUCOSE: 156 mg/dL — AB (ref 70–140)
Potassium: 4 mEq/L (ref 3.5–5.1)
SODIUM: 143 meq/L (ref 136–145)

## 2013-06-25 NOTE — Patient Instructions (Signed)
Dehydration, Adult Dehydration is when you lose more fluids from the body than you take in. Vital organs like the kidneys, brain, and heart cannot function without a proper amount of fluids and salt. Any loss of fluids from the body can cause dehydration.  CAUSES   Vomiting.  Diarrhea.  Excessive sweating.  Excessive urine output.  Fever. SYMPTOMS  Mild dehydration  Thirst.  Dry lips.  Slightly dry mouth. Moderate dehydration  Very dry mouth.  Sunken eyes.  Skin does not bounce back quickly when lightly pinched and released.  Dark urine and decreased urine production.  Decreased tear production.  Headache. Severe dehydration  Very dry mouth.  Extreme thirst.  Rapid, weak pulse (more than 100 beats per minute at rest).  Cold hands and feet.  Not able to sweat in spite of heat and temperature.  Rapid breathing.  Blue lips.  Confusion and lethargy.  Difficulty being awakened.  Minimal urine production.  No tears. DIAGNOSIS  Your caregiver will diagnose dehydration based on your symptoms and your exam. Blood and urine tests will help confirm the diagnosis. The diagnostic evaluation should also identify the cause of dehydration. TREATMENT  Treatment of mild or moderate dehydration can often be done at home by increasing the amount of fluids that you drink. It is best to drink small amounts of fluid more often. Drinking too much at one time can make vomiting worse. Refer to the home care instructions below. Severe dehydration needs to be treated at the hospital where you will probably be given intravenous (IV) fluids that contain water and electrolytes. HOME CARE INSTRUCTIONS   Ask your caregiver about specific rehydration instructions.  Drink enough fluids to keep your urine clear or pale yellow.  Drink small amounts frequently if you have nausea and vomiting.  Eat as you normally do.  Avoid:  Foods or drinks high in sugar.  Carbonated  drinks.  Juice.  Extremely hot or cold fluids.  Drinks with caffeine.  Fatty, greasy foods.  Alcohol.  Tobacco.  Overeating.  Gelatin desserts.  Wash your hands well to avoid spreading bacteria and viruses.  Only take over-the-counter or prescription medicines for pain, discomfort, or fever as directed by your caregiver.  Ask your caregiver if you should continue all prescribed and over-the-counter medicines.  Keep all follow-up appointments with your caregiver. SEEK MEDICAL CARE IF:  You have abdominal pain and it increases or stays in one area (localizes).  You have a rash, stiff neck, or severe headache.  You are irritable, sleepy, or difficult to awaken.  You are weak, dizzy, or extremely thirsty. SEEK IMMEDIATE MEDICAL CARE IF:   You are unable to keep fluids down or you get worse despite treatment.  You have frequent episodes of vomiting or diarrhea.  You have blood or green matter (bile) in your vomit.  You have blood in your stool or your stool looks black and tarry.  You have not urinated in 6 to 8 hours, or you have only urinated a small amount of very dark urine.  You have a fever.  You faint. MAKE SURE YOU:   Understand these instructions.  Will watch your condition.  Will get help right away if you are not doing well or get worse. Document Released: 12/19/2004 Document Revised: 03/13/2011 Document Reviewed: 08/08/2010 ExitCare Patient Information 2015 [employer]. This information is not intended to replace advice given to you by your health care provider. Make sure you discuss any questions you have with your health care   provider.  Nausea and Vomiting Nausea is a sick feeling that often comes before throwing up (vomiting). Vomiting is a reflex where stomach contents come out of your mouth. Vomiting can cause severe loss of body fluids (dehydration). Children and elderly adults can become dehydrated quickly, especially if they also have  diarrhea. Nausea and vomiting are symptoms of a condition or disease. It is important to find the cause of your symptoms. CAUSES   Direct irritation of the stomach lining. This irritation can result from increased acid production (gastroesophageal reflux disease), infection, food poisoning, taking certain medicines (such as nonsteroidal anti-inflammatory drugs), alcohol use, or tobacco use.  Signals from the brain.These signals could be caused by a headache, heat exposure, an inner ear disturbance, increased pressure in the brain from injury, infection, a tumor, or a concussion, pain, emotional stimulus, or metabolic problems.  An obstruction in the gastrointestinal tract (bowel obstruction).  Illnesses such as diabetes, hepatitis, gallbladder problems, appendicitis, kidney problems, cancer, sepsis, atypical symptoms of a heart attack, or eating disorders.  Medical treatments such as chemotherapy and radiation.  Receiving medicine that makes you sleep (general anesthetic) during surgery. DIAGNOSIS Your caregiver may ask for tests to be done if the problems do not improve after a few days. Tests may also be done if symptoms are severe or if the reason for the nausea and vomiting is not clear. Tests may include:  Urine tests.  Blood tests.  Stool tests.  Cultures (to look for evidence of infection).  X-rays or other imaging studies. Test results can help your caregiver make decisions about treatment or the need for additional tests. TREATMENT You need to stay well hydrated. Drink frequently but in small amounts.You may wish to drink water, sports drinks, clear broth, or eat frozen ice pops or gelatin dessert to help stay hydrated.When you eat, eating slowly may help prevent nausea.There are also some antinausea medicines that may help prevent nausea. HOME CARE INSTRUCTIONS   Take all medicine as directed by your caregiver.  If you do not have an appetite, do not force yourself to  eat. However, you must continue to drink fluids.  If you have an appetite, eat a normal diet unless your caregiver tells you differently.  Eat a variety of complex carbohydrates (rice, wheat, potatoes, bread), lean meats, yogurt, fruits, and vegetables.  Avoid high-fat foods because they are more difficult to digest.  Drink enough water and fluids to keep your urine clear or pale yellow.  If you are dehydrated, ask your caregiver for specific rehydration instructions. Signs of dehydration may include:  Severe thirst.  Dry lips and mouth.  Dizziness.  Dark urine.  Decreasing urine frequency and amount.  Confusion.  Rapid breathing or pulse. SEEK IMMEDIATE MEDICAL CARE IF:   You have blood or brown flecks (like coffee grounds) in your vomit.  You have black or bloody stools.  You have a severe headache or stiff neck.  You are confused.  You have severe abdominal pain.  You have chest pain or trouble breathing.  You do not urinate at least once every 8 hours.  You develop cold or clammy skin.  You continue to vomit for longer than 24 to 48 hours.  You have a fever. MAKE SURE YOU:   Understand these instructions.  Will watch your condition.  Will get help right away if you are not doing well or get worse. Document Released: 12/19/2004 Document Revised: 03/13/2011 Document Reviewed: 05/18/2010 ExitCare Patient Information 2015 [employer]. This information   is not intended to replace advice given to you by your health care provider. Make sure you discuss any questions you have with your health care provider.  

## 2013-06-25 NOTE — Telephone Encounter (Signed)
Per staff phone call and POF I have schedueld appts. Scheduler advised of appts.  JMW  

## 2013-06-25 NOTE — Telephone Encounter (Signed)
gv adn printed appt sched and avs for pt for June and July....sed added tx....pt sched for Opthmology 7.3 @ 1:45pm with Dr Baird Cancer

## 2013-06-25 NOTE — Progress Notes (Signed)
[facility] OFFICE PROGRESS NOTE  [name], MD [place] [address]  DIAGNOSIS: Malignant neoplasm of lower third of esophagus - Plan: CBC with Differential, Basic metabolic panel (Bmet) - [facility], CBC with Differential, Basic metabolic panel (Bmet) - [facility], CBC with Differential, Comprehensive metabolic panel (Cmet) - [facility]  Bone metastases  Brain metastasis  Visual changes - Plan: Ambulatory referral to Ophthalmology  Chief Complaint  Patient presents with  . Malignant neoplasm of lower third of esophagus    CURRENT TREATMENT:   Planning to start (ECF) consisting of  Epirubicin 50 mg/m2 IV, Cisplatin 60 mg/m2 IV on day #1, Fluorouracil 200 mg/m2 per day IV continuous infusion over 24 hours daily on days 1-21 cycled every 21 days for six months on 06/27/2013.  He started palliative XRT to esophageal mass on 06/09/2013 per Dr. Eppie Gibson. It ended on 06/23/2013 s/p 10 treatments.    INTERVAL HISTORY: HANSEL Gonzalez 58 y.o. male with a history of newly diagnosed metastatic GEJ adenocarcinoma is here for follow up.  He was seen by me on 06/18/2013.  Today, he reports some improvement is his dysphagia.  He can now swallow some breads her reports. He finished XRT on 06/23/2013.  He reports continued visual changes and had an MRI of brain on 06/19/2013 which revealed a solitary brain metastases in his right frontal lobe.   He denies melana or hematochezia.   ADDITIONAL ONCOLOGY HISTORY: As previously reported, he initially was evaluated by Dr. Osborne Casco in one or two weeks prior to his visit with Dr. Carlean Purl on 05/12/2013. He was having a "acid reflux" burning sensation and it bothered him at night. He also noted feeling as if his foods was feeling stuck. Due to persistence in these symptoms including odynophagia, reflux and solid dysphagia with weight loss (he lost 16 lbs over the past few months), her was referred to Dr. Carlean Purl. Dr. Carlean Purl scheduled him  for an EGD on 05/13/2013. Of note, he had been on PPI bid since January with minimal improvement in symptoms. He reported a longstanding smoking history for the past 20 plus years. He smokes about 0.5 packs per day. He drinks 3-4 beers per day (every other day) for the past 15 plus years. He had an EGD which showed circumferential mass at the gastroesophageal junction. Multiple biopsies were obtained and it were determined to esophageal adenocarcinoma without amplification of HER-2 detected. He had a CT chest and abdomen and pelvis on 05/19/2013, which showed a mass of 4.6 cm along the GE junction. The esophagus was dilated above this mass. There was a suspected 12 mm lymph node near the GE junction. There was also bulky gastrohepatic ligament adenopathy noted with combined lymph nodes measuring up to 3 cm. There was also borderdline celiac axis adenopathy all suspicious for metastatic disease. There were no findings suggestive of hepatic metastatic disease, osseous metastatic disease or lung disease.   MEDICAL HISTORY: Past Medical History  Diagnosis Date  . Allergy   . Hemorrhoids   . Back pain     right side  . Bunion, right foot   . Left tennis elbow   . Osteoarthritis     hands, knees, right hip  . Prostatitis   . Diverticulosis 2013  . Adenomatous colon polyp   . ED (erectile dysfunction)   . Tobacco abuse   . Reflux esophagitis   . Nonspecific elevation of levels of transaminase or lactic acid dehydrogenase (LDH)     INTERIM HISTORY:  has Internal hemorrhoids with complication; Personal history of colonic polyps; Post-op pain; Anal pain; Malignant neoplasm of lower third of esophagus; Bone metastases; Brain metastasis; and Visual changes on his problem list.    ALLERGIES:  has No Known Allergies.  MEDICATIONS: has a current medication list which includes the following prescription(s): dexamethasone, diphenhydramine hcl, lidocaine-prilocaine, lorazepam, montelukast, nicotine, nicotine,  nicotine, ondansetron, oxycodone, phenyleph-doxylamine-dm-apap, prochlorperazine, ranitidine, sucralfate, sucralfate, and tetrahydrozoline hcl.  SURGICAL HISTORY:  Past Surgical History  Procedure Laterality Date  . Cyst on testicle Right   . Appendectomy    . Tonsillectomy    . Knee surgery Bilateral     arthroscopic surg /  bil knees  . Fatty tumor Right     removal forearm  . Colonoscopy w/ polypectomy    . Sphincterotomy  08/28/2011    Procedure: SPHINCTEROTOMY;  Surgeon: Joyice Faster. Cornett, MD;  Location: [facility];  Service: General;  Laterality: N/A;  Lateral internal sphincterotomy  . Hemorrhoid surgery  08/28/2011    Procedure: HEMORRHOIDECTOMY;  Surgeon: Joyice Faster. Cornett, MD;  Location: [facility];  Service: General;  Laterality: N/A;  ,possible hemorrhoidectomy  . Examination under anesthesia  12/13/2011    Procedure: EXAM UNDER ANESTHESIA;  Surgeon: Marcello Moores A. Cornett, MD;  Location: [facility];  Service: General;  Laterality: N/A;  . Anal fissure repair  2013    Cornett    REVIEW OF SYSTEMS:   Constitutional: Denies fevers, chills or abnormal weight loss Eyes: Denies blurriness of vision Ears, nose, mouth, throat, and face: Denies mucositis or sore throat; He denies hearing lost.  Respiratory: Denies cough, dyspnea or wheezes Cardiovascular: Denies palpitation, chest discomfort or lower extremity swelling Gastrointestinal:  Denies nausea, heartburn or change in bowel habits; as noted in HPI.  Skin: Denies abnormal skin rashes Lymphatics: Denies new lymphadenopathy or easy bruising Neurological:Denies numbness, tingling or new weaknesses Behavioral/Psych: Mood is stable, no new changes  All other systems were reviewed with the patient and are negative.  PHYSICAL EXAMINATION: ECOG PERFORMANCE STATUS: 0 - Asymptomatic  Blood pressure 148/75, pulse 91, temperature 98.5 F (36.9 C), temperature source Oral, resp. rate 18, height $RemoveBe'5\' 4"'YqJUwlxST$  (1.626 m), weight 112 lb (50.803 kg), SpO2  100.00%.  GENERAL:alert, no distress and comfortable; thin male in no acute distress SKIN: skin color, texture, turgor are normal, no rashes or significant lesions; R port a cath (placed on 06/09/2013). EYES: normal, Conjunctiva are pink and non-injected, sclera clear OROPHARYNX:no exudate, no erythema and lips, buccal mucosa, and tongue normal  NECK: supple, thyroid normal size, non-tender, without nodularity LYMPH:  no palpable lymphadenopathy in the cervical, axillary or supraclavicular LUNGS: clear to auscultation with normal breathing effort, no wheezes or rhonchi HEART: regular rate & rhythm and no murmurs and no lower extremity edema ABDOMEN:abdomen soft, non-tender and normal bowel sounds Musculoskeletal:no cyanosis of digits and no clubbing  NEURO: alert & oriented x 3 with fluent speech, no focal motor/sensory deficits  Labs:  Lab Results  Component Value Date   WBC 7.1 06/25/2013   HGB 15.4 06/25/2013   HCT 46.6 06/25/2013   MCV 90.6 06/25/2013   PLT 268 06/25/2013   NEUTROABS 5.3 06/25/2013      Chemistry      Component Value Date/Time   NA 143 06/25/2013 1039   NA 138 05/13/2013 1719   K 4.0 06/25/2013 1039   K 5.0 05/13/2013 1719   CL 103 05/13/2013 1719   CO2 30* 06/25/2013 1039   CO2 27 05/13/2013 1719   BUN  9.0 06/25/2013 1039   BUN 10 05/13/2013 1719   CREATININE 1.1 06/25/2013 1039   CREATININE 1.0 05/13/2013 1719      Component Value Date/Time   CALCIUM 9.7 06/25/2013 1039   CALCIUM 9.1 05/13/2013 1719   ALKPHOS 69 06/18/2013 0816   ALKPHOS 70 05/13/2013 1719   AST 25 06/18/2013 0816   AST 34 05/13/2013 1719   ALT 19 06/18/2013 0816   ALT 37 05/13/2013 1719   BILITOT 0.33 06/18/2013 0816   BILITOT 1.1 05/13/2013 1719       Basic Metabolic Panel:  Recent Labs Lab 06/25/13 1039  NA 143  K 4.0  CO2 30*  GLUCOSE 156*  BUN 9.0  CREATININE 1.1  CALCIUM 9.7   GFR Estimated Creatinine Clearance: 53.9 ml/min (by C-G formula based on Cr of 1.1). Liver Function  Tests: No results found for this basename: AST, ALT, ALKPHOS, BILITOT, PROT, ALBUMIN,  in the last 168 hours No results found for this basename: LIPASE, AMYLASE,  in the last 168 hours No results found for this basename: AMMONIA,  in the last 168 hours Coagulation profile No results found for this basename: INR, PROTIME,  in the last 168 hours  CBC:  Recent Labs Lab 06/25/13 1039  WBC 7.1  NEUTROABS 5.3  HGB 15.4  HCT 46.6  MCV 90.6  PLT 268    Anemia work up No results found for this basename: VITAMINB12, FOLATE, FERRITIN, TIBC, IRON, RETICCTPCT,  in the last 72 hours  Studies:   RADIOGRAPHIC STUDIES: MRI of the Brain 06/19/2013 MRI HEAD WITHOUT AND WITH CONTRAST  TECHNIQUE:  Multiplanar, multiecho pulse sequences of the brain and surrounding  structures were obtained without and with intravenous contrast.  CONTRAST: 62mL MULTIHANCE GADOBENATE DIMEGLUMINE 529 MG/ML IV SOLN  COMPARISON: None.  FINDINGS:  There is no acute infarct. There is mild generalized cerebral  atrophy. There are a few small foci of T2 hyperintensity within the  deep cerebral white matter bilaterally, nonspecific and not greater  than expected for patient's age. There is a 3 mm focus of cortical  enhancement in the right superior frontal gyrus (series 10, image  51). No other abnormal enhancement is identified. There is no  midline shift, intracranial hemorrhage, or extra-axial fluid  collection.  Orbits are unremarkable. There is mild-to-moderate right maxillary  sinus mucosal thickening. Mastoid air cells are clear. Major  intracranial vascular flow voids are preserved.  IMPRESSION:  Single 3 mm enhancing lesion in the right frontal lobe, concerning  for solitary metastasis.   ASSESSMENT: Devin Gonzalez 58 y.o. male with a history of Malignant neoplasm of lower third of esophagus - Plan: CBC with Differential, Basic metabolic panel (Bmet) - [facility], CBC with Differential, Basic metabolic panel  (Bmet) - [facility], CBC with Differential, Comprehensive metabolic panel (Cmet) - [facility]  Bone metastases  Brain metastasis  Visual changes - Plan: Ambulatory referral to Ophthalmology   PLAN:   1. Metastatic esophageal adenocarcinoma (GE junction), Stage IV --On prior visit, we reviewed his scans and pathology extensively consistent with the above. CT of chest and abdomen showed a long segment distal esophageal mass extending right to the GE junction that was approximately the 4.6 cm along with enlarged aorticopulmonary window lymph node, GE junction lymph node, gastrohepatic ligament adenopathy and borderline celiac axis adenopathy all suspicious for metastatic disease likely Stage IV.  We obtained a PET to see which nodes express FDG avidity and to determine if preoperative chemotherapy or chemoradiation prior to esophagectomy or  definitive chemoradiation (given his good functional status) is appropriate. His Her2 was negative. His PET confirmed metastatic lymphadenopathy in mediastinum, gastrohepatic ligament, and right abdominal retroperitoneum.  Two intramuscular hypermetabolic foci within right shoulder for a girl, highly suspicious for intramuscular metastases.  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    Based on care and in NCCN guidelines version 3.2015 for Esophageal and esophagogastric jucntion cancers for metastatic systemic therapies, first line therapy with ECF is catagory 1 preferred regimens for medically fit patients with good PS and access to frequent toxicity evaluation.   Given his good functional status and lack of neurotoxicity, nephrotoxicity or hearing problems, we recommended systemic chemotherapy in the form of ECF (epirubicin, cisplatin, and fluorouracil).   We then discussed the indications for treatment would palliation for his cancer which is not curable given his PET findings, and the  benefits, and risks of chemotherapy in detail. The risks includes but is not limited to  myelotoxicity, renal dysfunction, leukocytopenia or diarrhea, neurotoxicity, palmar plantar erythrodysestesiasis, hearing toxicity and other toxicities. He consented and agreed to proceed with chemotherapy understanding these risks and benefits. He will proceed with chemotherapy tomorrow on 06/24/05/2013 with the following regimen:   --Epirubicin 50 mg per meter squared IV on day one  --Cisplatin 60 mg per meter squared IV on day one  --Fluorouracil 200 mg per meter squared IV continuous infusion over 24 hours daily on days 1 through 21   We will plan on 6-8 cycles of treatment or unacceptable toxicity.  References are as follows: Clint Lipps, et. Al, NEJM 4193;790:24 and Sumpter K, et al. Br J Cancer 2005; 09:7353.     --We will monitor CBC with differential and platelet count day 1 prior to these treatment cycle. We will assess BMP including creatinine, liver function test once per cycle on day 1 prior to each treatment cycle. We will monitor for neurotoxicity prior CTs treatment cycle.  We had a Port-a-cath placed on 06/09/2013.    Prophylaxis with G-CSF is not warranted with the incidence of grade 3 or 4 febrile neutropenia is at 9%. This chemotherapy regimen is at high emesis risk so therefore he was provided with anti-emetics including Compazine, dexamethasone, Zofran and Ativan. He has been referred for nutrient evaluation.  --Given his dysphagia and weight lost, he saw Dr. Isidore Moos in consideration of focal palliative XRT to his esophogeal mass. He was started on 06/08 and finished on 06/22.    He notes some improvement in his swallowing.   --He has evidence of bone metastases, and bisphosphonate therapy will also be considered.   2. Visual Changes. --He had an MRI of brain as noted above with one isolated brain metastases.  We will refer to ophthalmology today.  Single isolated brain met (3 mm in size) is unlikely to cause his visual changes. He reports increased blurriness and sensitivity to  bright lights.   3. Tobacco Abuse.  --Referral to tobacco cessation classes were made last visit   4. Follow up.  --Patient will follow up in 3 weeks with for a symptom visit and in consideration of chemotherapy, cycle #2 of ECF.  Labs weekly for his first cycle of ECF.   All questions were answered. The patient knows to call the clinic with any problems, questions or concerns. We can certainly see the patient much sooner if necessary.  I spent 15 minutes counseling the patient face to face. The total time spent in the appointment was 25 minutes.    CHISM, DAVID, MD 06/25/2013 2:26  PM

## 2013-06-26 ENCOUNTER — Other Ambulatory Visit: Payer: Self-pay | Admitting: *Deleted

## 2013-06-26 ENCOUNTER — Ambulatory Visit (HOSPITAL_BASED_OUTPATIENT_CLINIC_OR_DEPARTMENT_OTHER): Payer: BC Managed Care – PPO

## 2013-06-26 ENCOUNTER — Encounter: Payer: Self-pay | Admitting: Radiation Oncology

## 2013-06-26 VITALS — BP 145/87 | HR 90 | Temp 98.5°F

## 2013-06-26 DIAGNOSIS — C7952 Secondary malignant neoplasm of bone marrow: Secondary | ICD-10-CM

## 2013-06-26 DIAGNOSIS — Z5111 Encounter for antineoplastic chemotherapy: Secondary | ICD-10-CM

## 2013-06-26 DIAGNOSIS — C155 Malignant neoplasm of lower third of esophagus: Secondary | ICD-10-CM

## 2013-06-26 DIAGNOSIS — C7951 Secondary malignant neoplasm of bone: Secondary | ICD-10-CM

## 2013-06-26 MED ORDER — SODIUM CHLORIDE 0.9 % IV SOLN
Freq: Once | INTRAVENOUS | Status: AC
  Administered 2013-06-26: 09:00:00 via INTRAVENOUS

## 2013-06-26 MED ORDER — LORAZEPAM 2 MG/ML IJ SOLN
0.5000 mg | Freq: Once | INTRAMUSCULAR | Status: AC
  Administered 2013-06-26: 0.5 mg via INTRAVENOUS

## 2013-06-26 MED ORDER — MANNITOL 25 % IV SOLN
Freq: Once | INTRAVENOUS | Status: AC
  Administered 2013-06-26: 09:00:00 via INTRAVENOUS
  Filled 2013-06-26: qty 10

## 2013-06-26 MED ORDER — PALONOSETRON HCL INJECTION 0.25 MG/5ML
0.2500 mg | Freq: Once | INTRAVENOUS | Status: AC
  Administered 2013-06-26: 0.25 mg via INTRAVENOUS

## 2013-06-26 MED ORDER — SODIUM CHLORIDE 0.9 % IJ SOLN
10.0000 mL | INTRAMUSCULAR | Status: DC | PRN
  Filled 2013-06-26: qty 10

## 2013-06-26 MED ORDER — SODIUM CHLORIDE 0.9 % IV SOLN
200.0000 mg/m2/d | INTRAVENOUS | Status: DC
  Administered 2013-06-26: 2200 mg via INTRAVENOUS
  Filled 2013-06-26: qty 44

## 2013-06-26 MED ORDER — LORAZEPAM 2 MG/ML IJ SOLN
INTRAMUSCULAR | Status: AC
  Filled 2013-06-26: qty 1

## 2013-06-26 MED ORDER — DEXAMETHASONE SODIUM PHOSPHATE 20 MG/5ML IJ SOLN
INTRAMUSCULAR | Status: AC
  Filled 2013-06-26: qty 5

## 2013-06-26 MED ORDER — FOSAPREPITANT DIMEGLUMINE INJECTION 150 MG
150.0000 mg | Freq: Once | INTRAVENOUS | Status: AC
  Administered 2013-06-26: 150 mg via INTRAVENOUS
  Filled 2013-06-26: qty 5

## 2013-06-26 MED ORDER — ALUM & MAG HYDROXIDE-SIMETH 200-200-20 MG/5ML PO SUSP
30.0000 mL | Freq: Once | ORAL | Status: AC
  Administered 2013-06-26: 30 mL via ORAL
  Filled 2013-06-26: qty 30

## 2013-06-26 MED ORDER — HEPARIN SOD (PORK) LOCK FLUSH 100 UNIT/ML IV SOLN
500.0000 [IU] | Freq: Once | INTRAVENOUS | Status: DC | PRN
  Filled 2013-06-26: qty 5

## 2013-06-26 MED ORDER — CISPLATIN CHEMO INJECTION 100MG/100ML
60.0000 mg/m2 | Freq: Once | INTRAVENOUS | Status: AC
  Administered 2013-06-26: 94 mg via INTRAVENOUS
  Filled 2013-06-26: qty 94

## 2013-06-26 MED ORDER — PALONOSETRON HCL INJECTION 0.25 MG/5ML
INTRAVENOUS | Status: AC
  Filled 2013-06-26: qty 5

## 2013-06-26 MED ORDER — DEXAMETHASONE SODIUM PHOSPHATE 20 MG/5ML IJ SOLN
12.0000 mg | Freq: Once | INTRAMUSCULAR | Status: AC
  Administered 2013-06-26: 12 mg via INTRAVENOUS

## 2013-06-26 MED ORDER — EPIRUBICIN HCL CHEMO IV INJECTION 200 MG/100ML
50.0000 mg/m2 | Freq: Once | INTRAVENOUS | Status: AC
  Administered 2013-06-26: 78 mg via INTRAVENOUS
  Filled 2013-06-26: qty 39

## 2013-06-26 NOTE — Progress Notes (Signed)
Dr. Juliann Mule notified of patient's c/o reflux symptoms upon arrival to infusion appt. Pt usually takes alka seltzer at home but did not take any today. Order obtained per MD for Maalox.

## 2013-06-26 NOTE — Progress Notes (Signed)
6.25.15:  Faxed claim form to [facility] [phone]) - completed by Dr. Isidore Moos - scanned

## 2013-06-26 NOTE — Patient Instructions (Signed)
[facility] Discharge Instructions for Patients Receiving Chemotherapy  Today you received the following chemotherapy agents epirubicin/cisplatin/fluorouracil.    To help prevent nausea and vomiting after your treatment, we encourage you to take your nausea medication as directed.    If you develop nausea and vomiting that is not controlled by your nausea medication, call the clinic.   BELOW ARE SYMPTOMS THAT SHOULD BE REPORTED IMMEDIATELY:  *FEVER GREATER THAN 100.5 F  *CHILLS WITH OR WITHOUT FEVER  NAUSEA AND VOMITING THAT IS NOT CONTROLLED WITH YOUR NAUSEA MEDICATION  *UNUSUAL SHORTNESS OF BREATH  *UNUSUAL BRUISING OR BLEEDING  TENDERNESS IN MOUTH AND THROAT WITH OR WITHOUT PRESENCE OF ULCERS  *URINARY PROBLEMS  *BOWEL PROBLEMS  UNUSUAL RASH Items with * indicate a potential emergency and should be followed up as soon as possible.  Feel free to call the clinic you have any questions or concerns. The clinic phone number is [phone].

## 2013-06-27 ENCOUNTER — Telehealth: Payer: Self-pay | Admitting: *Deleted

## 2013-06-27 ENCOUNTER — Telehealth: Payer: Self-pay

## 2013-06-27 NOTE — Telephone Encounter (Signed)
S/w pt about how he is doing after chemo. He stated he was a little nauseated but took some compazine. Pt was re-instructed in how to use prn nausea meds.

## 2013-06-27 NOTE — Telephone Encounter (Signed)
Attempted to call patient, left detailed voicemail to call us if he is having any problems following chemotherapy given yesterday or has any questions.

## 2013-06-27 NOTE — Telephone Encounter (Signed)
Message copied by Verlon Setting on Fri Jun 27, 2013 11:51 AM ------      Message from: Arty Baumgartner      Created: Thu Jun 26, 2013 12:14 PM      Regarding: First time chemo       First time epirubicin/cisplatin/5FU pump.  Dr. Juliann Mule   [phone] ------

## 2013-06-27 NOTE — Telephone Encounter (Signed)
Pt had asked about insurance forms he had given to people in rad onc. Cheryl cheston called, then [employer]. Forms were sent to [facility]. The originals were either mailed back to pt or sent to scan center. If they were sent to scan center they were then shredded. Pt can pick up a copy of the forms in "media" file.

## 2013-06-28 ENCOUNTER — Other Ambulatory Visit: Payer: Self-pay

## 2013-06-28 ENCOUNTER — Inpatient Hospital Stay (HOSPITAL_COMMUNITY)
Admission: EM | Admit: 2013-06-28 | Discharge: 2013-07-05 | DRG: 438 | Disposition: A | Discharge status: Home / Self Care | Payer: BC Managed Care – PPO | Attending: Internal Medicine | Admitting: Internal Medicine

## 2013-06-28 ENCOUNTER — Encounter (HOSPITAL_COMMUNITY): Payer: Self-pay | Admitting: Emergency Medicine

## 2013-06-28 DIAGNOSIS — K5900 Constipation, unspecified: Secondary | ICD-10-CM | POA: Diagnosis present

## 2013-06-28 DIAGNOSIS — Z923 Personal history of irradiation: Secondary | ICD-10-CM

## 2013-06-28 DIAGNOSIS — C7952 Secondary malignant neoplasm of bone marrow: Secondary | ICD-10-CM

## 2013-06-28 DIAGNOSIS — G893 Neoplasm related pain (acute) (chronic): Secondary | ICD-10-CM

## 2013-06-28 DIAGNOSIS — R5381 Other malaise: Secondary | ICD-10-CM

## 2013-06-28 DIAGNOSIS — Z823 Family history of stroke: Secondary | ICD-10-CM

## 2013-06-28 DIAGNOSIS — E860 Dehydration: Secondary | ICD-10-CM | POA: Diagnosis present

## 2013-06-28 DIAGNOSIS — K859 Acute pancreatitis without necrosis or infection, unspecified: Principal | ICD-10-CM | POA: Diagnosis present

## 2013-06-28 DIAGNOSIS — R7309 Other abnormal glucose: Secondary | ICD-10-CM | POA: Diagnosis present

## 2013-06-28 DIAGNOSIS — R1310 Dysphagia, unspecified: Secondary | ICD-10-CM | POA: Diagnosis present

## 2013-06-28 DIAGNOSIS — K224 Dyskinesia of esophagus: Secondary | ICD-10-CM | POA: Diagnosis present

## 2013-06-28 DIAGNOSIS — C155 Malignant neoplasm of lower third of esophagus: Secondary | ICD-10-CM

## 2013-06-28 DIAGNOSIS — K219 Gastro-esophageal reflux disease without esophagitis: Secondary | ICD-10-CM

## 2013-06-28 DIAGNOSIS — B370 Candidal stomatitis: Secondary | ICD-10-CM

## 2013-06-28 DIAGNOSIS — H539 Unspecified visual disturbance: Secondary | ICD-10-CM

## 2013-06-28 DIAGNOSIS — R638 Other symptoms and signs concerning food and fluid intake: Secondary | ICD-10-CM

## 2013-06-28 DIAGNOSIS — K209 Esophagitis, unspecified without bleeding: Secondary | ICD-10-CM

## 2013-06-28 DIAGNOSIS — R066 Hiccough: Secondary | ICD-10-CM

## 2013-06-28 DIAGNOSIS — R1013 Epigastric pain: Secondary | ICD-10-CM

## 2013-06-28 DIAGNOSIS — Z79899 Other long term (current) drug therapy: Secondary | ICD-10-CM

## 2013-06-28 DIAGNOSIS — K2100 Gastro-esophageal reflux disease with esophagitis, without bleeding: Secondary | ICD-10-CM

## 2013-06-28 DIAGNOSIS — IMO0002 Reserved for concepts with insufficient information to code with codable children: Secondary | ICD-10-CM

## 2013-06-28 DIAGNOSIS — D6959 Other secondary thrombocytopenia: Secondary | ICD-10-CM | POA: Diagnosis present

## 2013-06-28 DIAGNOSIS — E43 Unspecified severe protein-calorie malnutrition: Secondary | ICD-10-CM

## 2013-06-28 DIAGNOSIS — C7951 Secondary malignant neoplasm of bone: Secondary | ICD-10-CM

## 2013-06-28 DIAGNOSIS — R739 Hyperglycemia, unspecified: Secondary | ICD-10-CM

## 2013-06-28 DIAGNOSIS — Z801 Family history of malignant neoplasm of trachea, bronchus and lung: Secondary | ICD-10-CM

## 2013-06-28 DIAGNOSIS — F17200 Nicotine dependence, unspecified, uncomplicated: Secondary | ICD-10-CM | POA: Diagnosis present

## 2013-06-28 DIAGNOSIS — D72819 Decreased white blood cell count, unspecified: Secondary | ICD-10-CM | POA: Diagnosis present

## 2013-06-28 DIAGNOSIS — Z720 Tobacco use: Secondary | ICD-10-CM

## 2013-06-28 DIAGNOSIS — T451X5A Adverse effect of antineoplastic and immunosuppressive drugs, initial encounter: Secondary | ICD-10-CM

## 2013-06-28 DIAGNOSIS — C7931 Secondary malignant neoplasm of brain: Secondary | ICD-10-CM

## 2013-06-28 DIAGNOSIS — B3781 Candidal esophagitis: Secondary | ICD-10-CM | POA: Diagnosis present

## 2013-06-28 DIAGNOSIS — D701 Agranulocytosis secondary to cancer chemotherapy: Secondary | ICD-10-CM | POA: Diagnosis not present

## 2013-06-28 MED ORDER — HYDROMORPHONE HCL PF 1 MG/ML IJ SOLN
1.0000 mg | Freq: Once | INTRAMUSCULAR | Status: AC
  Administered 2013-06-29: 1 mg via INTRAVENOUS
  Filled 2013-06-28: qty 1

## 2013-06-28 MED ORDER — PROMETHAZINE HCL 25 MG/ML IJ SOLN
25.0000 mg | Freq: Once | INTRAMUSCULAR | Status: AC
  Administered 2013-06-29: 25 mg via INTRAVENOUS
  Filled 2013-06-28: qty 1

## 2013-06-28 MED ORDER — ONDANSETRON HCL 4 MG/2ML IJ SOLN: 4.0000 mg | Freq: Once | INTRAMUSCULAR | Status: DC

## 2013-06-28 MED ORDER — CHLORPROMAZINE HCL 25 MG/ML IJ SOLN
25.0000 mg | Freq: Once | INTRAMUSCULAR | Status: AC
  Administered 2013-06-29: 25 mg via INTRAMUSCULAR
  Filled 2013-06-28: qty 1

## 2013-06-28 NOTE — ED Notes (Signed)
On assessment patient states he is having a lot of reflux.

## 2013-06-28 NOTE — ED Provider Notes (Signed)
CSN: [id number]     Arrival date & time 06/28/13  2113 History   First MD Initiated Contact with Patient 06/28/13 2237     No chief complaint on file.    (Consider location/radiation/quality/duration/timing/severity/associated sxs/prior Treatment) HPI Pt is a 58yo male with hx of esophageal cancer, started on chemotherapy 2 days ago presenting to ED with persistent hiccups that started around 1700 this evening, associated with severe acid reflux and upper abdominal pain and burning, 8/10. Reports associated gagging and nausea. States he has an empty stomach as he has not been able to eat due to hiccups and discomforts.  Pt has hx zofran, decadron, and oxycodone at home w/o relief. Denies taking zantac today. Denies hx of previous issues with persistent hiccups. Denies fever, vomiting or diarrhea.   Past Medical History  Diagnosis Date  . Allergy   . Hemorrhoids   . Back pain     right side  . Bunion, right foot   . Left tennis elbow   . Osteoarthritis     hands, knees, right hip  . Prostatitis   . Diverticulosis 2013  . Adenomatous colon polyp   . ED (erectile dysfunction)   . Tobacco abuse   . Reflux esophagitis   . Nonspecific elevation of levels of transaminase or lactic acid dehydrogenase (LDH)    Past Surgical History  Procedure Laterality Date  . Cyst on testicle Right   . Appendectomy    . Tonsillectomy    . Knee surgery Bilateral     arthroscopic surg /  bil knees  . Fatty tumor Right     removal forearm  . Colonoscopy w/ polypectomy    . Sphincterotomy  08/28/2011    Procedure: SPHINCTEROTOMY;  Surgeon: Joyice Faster. Cornett, MD;  Location: [place];  Service: General;  Laterality: N/A;  Lateral internal sphincterotomy  . Hemorrhoid surgery  08/28/2011    Procedure: HEMORRHOIDECTOMY;  Surgeon: Joyice Faster. Cornett, MD;  Location: [place];  Service: General;  Laterality: N/A;  ,possible hemorrhoidectomy  . Examination under anesthesia  12/13/2011    Procedure: EXAM UNDER  ANESTHESIA;  Surgeon: Marcello Moores A. Cornett, MD;  Location: [place];  Service: General;  Laterality: N/A;  . Anal fissure repair  2013    Cornett   Family History  Problem Relation Age of Onset  . Lung cancer Mother   . Cancer Father   . Lung cancer Brother   . Stroke Brother    History  Substance Use Topics  . Smoking status: Former Smoker -- 0.50 packs/day for 20 years    Types: Cigarettes    Quit date: 06/26/2013  . Smokeless tobacco: Never Used  . Alcohol Use: Yes     Comment: stopped 7 weeks ago 06/28/13    Review of Systems  Constitutional: Negative for chills and fatigue.  Respiratory: Negative for cough and shortness of breath.   Cardiovascular: Negative for chest pain and palpitations.  Gastrointestinal: Positive for vomiting and abdominal pain (upper abdomen along diaphragm ). Negative for nausea and diarrhea.       Intractable hiccups  All other systems reviewed and are negative.     Allergies  Review of patient's allergies indicates no known allergies.  Home Medications   Prior to Admission medications   Medication Sig Start Date End Date Taking? Authorizing Phinehas Grounds  dexamethasone (DECADRON) 4 MG tablet Take 2 tablets by mouth once a day on the day after chemotherapy and then take 2 tablets two times a day for 2  days. Take with food. 06/04/13  Yes Concha Norway, MD  DiphenhydrAMINE HCl (BENADRYL ALLERGY PO) Take 25 mg by mouth at bedtime.    Yes Historical Mishell Donalson, MD  lidocaine-prilocaine (EMLA) cream Apply to port site one hour before infusion and cover with plastic wrap 06/17/13  Yes Concha Norway, MD  LORazepam (ATIVAN) 0.5 MG tablet Take 1 tablet (0.5 mg total) by mouth every 6 (six) hours as needed (Nausea or vomiting). 06/04/13  Yes Concha Norway, MD  montelukast (SINGULAIR) 10 MG tablet Take 10 mg by mouth daily.  06/05/13  Yes Historical Kierre Hintz, MD  nicotine (NICODERM CQ) 21 mg/24hr patch Place 1 patch (21 mg total) onto the skin daily. apply 21 mg patch daily x6wk,  then 14 mg patch daily x2wk, then 7 mg patch daily x2wk 06/16/13  Yes Eppie Gibson, MD  Phenyleph-Doxylamine-DM-APAP (ALKA SELTZER PLUS PO) Take 2 capsules by mouth as needed (For reflux).    Yes Historical Deshara Rossi, MD  prochlorperazine (COMPAZINE) 10 MG tablet Take 1 tablet (10 mg total) by mouth every 6 (six) hours as needed (Nausea or vomiting). 06/04/13  Yes Concha Norway, MD  ranitidine (ZANTAC) 150 MG tablet Take 1 tablet (150 mg total) by mouth 2 (two) times daily. 06/23/13  Yes Eppie Gibson, MD  sucralfate (CARAFATE) 1 GM/10ML suspension Take 10 mLs (1 g total) by mouth 4 (four) times daily -  with meals and at bedtime. (Use before meals) 06/06/13  Yes Eppie Gibson, MD  Tetrahydrozoline HCl (VISINE OP) Place 2-4 drops into both eyes 2 (two) times daily as needed. For dry eyes   Yes Historical Minsa Weddington, MD  chlorproMAZINE (THORAZINE) 10 MG tablet Take 1 tablet (10 mg total) by mouth 4 (four) times daily as needed for hiccoughs. 06/29/13   Noland Fordyce, PA-C  nicotine (NICODERM CQ) 14 mg/24hr patch Place 1 patch (14 mg total) onto the skin daily. apply 21 mg patch daily x6wk, then 14 mg patch daily x2wk, then 7 mg patch daily x2wk 06/16/13   Eppie Gibson, MD  nicotine (NICODERM CQ) 7 mg/24hr patch Place 1 patch (7 mg total) onto the skin daily. apply 21 mg patch daily x6wk, then 14 mg patch daily x2wk, then 7 mg patch daily x2wk 06/16/13   Eppie Gibson, MD  ondansetron (ZOFRAN) 8 MG tablet Take 1 tablet (8 mg total) by mouth 2 (two) times daily as needed. Start on the third day after chemotherapy. 06/04/13   Concha Norway, MD  oxyCODONE (ROXICODONE) 5 MG/5ML solution Take 5 mLs (5 mg total) by mouth every 4 (four) hours as needed for moderate pain or severe pain. Take miralax or other laxatives as needed to avoid constipation with this 05/13/13   Gatha Mayer, MD   BP 132/73  Pulse 82  Temp(Src) 98.7 F (37.1 C) (Oral)  Resp 16  Ht 5\' 4"  (1.626 m)  Wt 119 lb 0.8 oz (54 kg)  BMI 20.42 kg/m2  SpO2  98% Physical Exam  Nursing note and vitals reviewed. Constitutional: He appears well-developed and well-nourished.  Pt lying in exam bed, holding emesis bag, appears uncomfortable, intermittent hiccups   HENT:  Head: Normocephalic and atraumatic.  Eyes: Conjunctivae are normal. No scleral icterus.  Neck: Normal range of motion.  Cardiovascular: Normal rate, regular rhythm and normal heart sounds.   Regular rate and rhythm  Pulmonary/Chest: Effort normal and breath sounds normal. No respiratory distress. He has no wheezes. He has no rales. He exhibits no tenderness.  Clean dry port placed in right  upper chest. Lungs: CTAB  Abdominal: Soft. Bowel sounds are normal. He exhibits no distension and no mass. There is tenderness. There is no rebound and no guarding.  Soft, non-distended. Tenderness along upper abdomen, worse in epigastrium and LUQ  Musculoskeletal: Normal range of motion.  Neurological: He is alert.  Skin: Skin is warm and dry.    ED Course  Procedures (including critical care time) Labs Review Labs Reviewed  CBC WITH DIFFERENTIAL - Abnormal; Notable for the following:    Neutrophils Relative % 95 (*)    Lymphocytes Relative 2 (*)    Lymphs Abs 0.2 (*)    All other components within normal limits  LIPASE, BLOOD - Abnormal; Notable for the following:    Lipase 161 (*)    All other components within normal limits  COMPREHENSIVE METABOLIC PANEL - Abnormal; Notable for the following:    Glucose, Bld 161 (*)    Albumin 3.1 (*)    Total Bilirubin 0.2 (*)    All other components within normal limits  HEMOGLOBIN A1C - Abnormal; Notable for the following:    Hemoglobin A1C 6.1 (*)    Mean Plasma Glucose 128 (*)    All other components within normal limits  BASIC METABOLIC PANEL - Abnormal; Notable for the following:    Glucose, Bld 149 (*)    All other components within normal limits  GLUCOSE, CAPILLARY - Abnormal; Notable for the following:    Glucose-Capillary 127 (*)     All other components within normal limits  GLUCOSE, CAPILLARY - Abnormal; Notable for the following:    Glucose-Capillary 135 (*)    All other components within normal limits  CBC    Imaging Review Dg Abd 1 View  06/29/2013   CLINICAL DATA:  Abdominal pain.  EXAM: ABDOMEN - 1 VIEW  COMPARISON:  None.  FINDINGS: Single supine view abdomen and pelvis. The high abdomen is partially excluded. Minimal motion degradation. Non-obstructive bowel gas pattern. Moderate amount of ascending and descending colonic stool. Distal gas and stool. No abnormal abdominal calcifications. No appendicolith.  IMPRESSION: No acute findings.  Possible constipation.  Decreased sensitivity and specificity exam due to technique related factors, as described above.   Electronically Signed   By: Abigail Miyamoto M.D.   On: 06/29/2013 12:27     EKG Interpretation None      MDM   Final diagnoses:  Intractable hiccups  Gastroesophageal reflux disease, esophagitis presence not specified  Acute pancreatitis, unspecified pancreatitis type    Pt is a 58yo male with esophageal cancer, started chemo 2 days ago c/o persistent hiccups and epigastric pain with nausea and acid reflux.  Pt appears well, non-toxic. Afebrile.  Abd- soft, non-distended, epigastric tenderness.  Intermittent hiccups during exam. Labs: mildly elevated lipase-161, otherwise, unremarkable.  Tx in ED: dilaudid, phenergan, chlorpromazine, protonix, and gi cocktail which did help with pt's symptoms. Hiccups have resolved.  Discussed pt with Dr. Reather Converse who also examined pt.  Pt did have some improvement in ED with fluids and pain medication, however, still c/o upper abdominal pain.  Pt admitted for observation.  Refer to Dr. Reather Converse note for further evaluation and tx plan.     Noland Fordyce, PA-C 06/29/13 1512

## 2013-06-28 NOTE — ED Notes (Signed)
Pt c/o hiccups and acid reflux onset 1700 today. Pt has constant chemo infusion through port in R chest.

## 2013-06-29 ENCOUNTER — Inpatient Hospital Stay (HOSPITAL_COMMUNITY): Payer: BC Managed Care – PPO

## 2013-06-29 DIAGNOSIS — R109 Unspecified abdominal pain: Secondary | ICD-10-CM

## 2013-06-29 DIAGNOSIS — K219 Gastro-esophageal reflux disease without esophagitis: Secondary | ICD-10-CM

## 2013-06-29 DIAGNOSIS — Z720 Tobacco use: Secondary | ICD-10-CM | POA: Diagnosis present

## 2013-06-29 DIAGNOSIS — R7309 Other abnormal glucose: Secondary | ICD-10-CM

## 2013-06-29 DIAGNOSIS — C7952 Secondary malignant neoplasm of bone marrow: Secondary | ICD-10-CM

## 2013-06-29 DIAGNOSIS — C7931 Secondary malignant neoplasm of brain: Secondary | ICD-10-CM

## 2013-06-29 DIAGNOSIS — K859 Acute pancreatitis without necrosis or infection, unspecified: Principal | ICD-10-CM | POA: Insufficient documentation

## 2013-06-29 DIAGNOSIS — C155 Malignant neoplasm of lower third of esophagus: Secondary | ICD-10-CM

## 2013-06-29 DIAGNOSIS — K209 Esophagitis, unspecified without bleeding: Secondary | ICD-10-CM

## 2013-06-29 DIAGNOSIS — R066 Hiccough: Secondary | ICD-10-CM

## 2013-06-29 DIAGNOSIS — R739 Hyperglycemia, unspecified: Secondary | ICD-10-CM | POA: Diagnosis present

## 2013-06-29 DIAGNOSIS — C7951 Secondary malignant neoplasm of bone: Secondary | ICD-10-CM

## 2013-06-29 DIAGNOSIS — R1013 Epigastric pain: Secondary | ICD-10-CM | POA: Diagnosis present

## 2013-06-29 DIAGNOSIS — C7949 Secondary malignant neoplasm of other parts of nervous system: Secondary | ICD-10-CM

## 2013-06-29 DIAGNOSIS — F17200 Nicotine dependence, unspecified, uncomplicated: Secondary | ICD-10-CM

## 2013-06-29 LAB — CBC WITH DIFFERENTIAL/PLATELET
Basophils Absolute: 0 10*3/uL (ref 0.0–0.1)
Basophils Relative: 0 % (ref 0–1)
Eosinophils Absolute: 0 10*3/uL (ref 0.0–0.7)
Eosinophils Relative: 0 % (ref 0–5)
HCT: 39.6 % (ref 39.0–52.0)
Hemoglobin: 14.1 g/dL (ref 13.0–17.0)
Lymphocytes Relative: 2 % — ABNORMAL LOW (ref 12–46)
Lymphs Abs: 0.2 10*3/uL — ABNORMAL LOW (ref 0.7–4.0)
MCH: 30.5 pg (ref 26.0–34.0)
MCHC: 35.6 g/dL (ref 30.0–36.0)
MCV: 85.5 fL (ref 78.0–100.0)
Monocytes Absolute: 0.2 10*3/uL (ref 0.1–1.0)
Monocytes Relative: 3 % (ref 3–12)
Neutro Abs: 6.8 10*3/uL (ref 1.7–7.7)
Neutrophils Relative %: 95 % — ABNORMAL HIGH (ref 43–77)
Platelets: 251 10*3/uL (ref 150–400)
RBC: 4.63 MIL/uL (ref 4.22–5.81)
RDW: 12.9 % (ref 11.5–15.5)
WBC: 7.2 10*3/uL (ref 4.0–10.5)

## 2013-06-29 LAB — BASIC METABOLIC PANEL
BUN: 23 mg/dL (ref 6–23)
CHLORIDE: 102 meq/L (ref 96–112)
CO2: 25 mEq/L (ref 19–32)
CREATININE: 0.94 mg/dL (ref 0.50–1.35)
Calcium: 8.4 mg/dL (ref 8.4–10.5)
GFR calc non Af Amer: >90 mL/min (ref >90)
Glucose, Bld: 149 mg/dL — ABNORMAL HIGH (ref 70–99)
POTASSIUM: 4.7 meq/L (ref 3.7–5.3)
Sodium: 138 mEq/L (ref 137–147)

## 2013-06-29 LAB — GLUCOSE, CAPILLARY
GLUCOSE-CAPILLARY: 127 mg/dL — AB (ref 70–99)
GLUCOSE-CAPILLARY: 130 mg/dL — AB (ref 70–99)
Glucose-Capillary: 112 mg/dL — ABNORMAL HIGH (ref 70–99)
Glucose-Capillary: 114 mg/dL — ABNORMAL HIGH (ref 70–99)
Glucose-Capillary: 135 mg/dL — ABNORMAL HIGH (ref 70–99)

## 2013-06-29 LAB — HEMOGLOBIN A1C
HEMOGLOBIN A1C: 6.1 % — AB (ref <5.7)
MEAN PLASMA GLUCOSE: 128 mg/dL — AB (ref <117)

## 2013-06-29 LAB — COMPREHENSIVE METABOLIC PANEL
ALT: 28 U/L (ref 0–53)
AST: 24 U/L (ref 0–37)
Albumin: 3.1 g/dL — ABNORMAL LOW (ref 3.5–5.2)
Alkaline Phosphatase: 73 U/L (ref 39–117)
BUN: 22 mg/dL (ref 6–23)
CO2: 29 mEq/L (ref 19–32)
Calcium: 8.6 mg/dL (ref 8.4–10.5)
Chloride: 101 mEq/L (ref 96–112)
Creatinine, Ser: 0.98 mg/dL (ref 0.50–1.35)
GFR calc Af Amer: >90 mL/min (ref >90)
GFR calc non Af Amer: >90 mL/min (ref >90)
Glucose, Bld: 161 mg/dL — ABNORMAL HIGH (ref 70–99)
Potassium: 4.5 mEq/L (ref 3.7–5.3)
Sodium: 141 mEq/L (ref 137–147)
Total Bilirubin: 0.2 mg/dL — ABNORMAL LOW (ref 0.3–1.2)
Total Protein: 6.6 g/dL (ref 6.0–8.3)

## 2013-06-29 LAB — CBC
HCT: 39.3 % (ref 39.0–52.0)
Hemoglobin: 13.7 g/dL (ref 13.0–17.0)
MCH: 30 pg (ref 26.0–34.0)
MCHC: 34.9 g/dL (ref 30.0–36.0)
MCV: 86 fL (ref 78.0–100.0)
Platelets: 213 10*3/uL (ref 150–400)
RBC: 4.57 MIL/uL (ref 4.22–5.81)
RDW: 12.9 % (ref 11.5–15.5)
WBC: 8.8 10*3/uL (ref 4.0–10.5)

## 2013-06-29 LAB — LIPASE, BLOOD: Lipase: 161 U/L — ABNORMAL HIGH (ref 11–59)

## 2013-06-29 MED ORDER — OXYCODONE HCL 5 MG/5ML PO SOLN
5.0000 mg | ORAL | Status: DC | PRN
  Administered 2013-07-05: 5 mg via ORAL
  Filled 2013-06-29: qty 5

## 2013-06-29 MED ORDER — DEXAMETHASONE 4 MG PO TABS: 8.0000 mg | ORAL_TABLET | Freq: Once | ORAL | Status: DC

## 2013-06-29 MED ORDER — BACLOFEN 5 MG HALF TABLET
5.0000 mg | ORAL_TABLET | Freq: Two times a day (BID) | ORAL | Status: AC
  Administered 2013-06-29 – 2013-07-01 (×6): 5 mg via ORAL
  Filled 2013-06-29 (×6): qty 1

## 2013-06-29 MED ORDER — ACETAMINOPHEN 650 MG RE SUPP: 650.0000 mg | Freq: Four times a day (QID) | RECTAL | Status: DC | PRN

## 2013-06-29 MED ORDER — ONDANSETRON HCL 4 MG/2ML IJ SOLN: 4.0000 mg | Freq: Three times a day (TID) | INTRAMUSCULAR | Status: AC | PRN

## 2013-06-29 MED ORDER — PANTOPRAZOLE SODIUM 40 MG IV SOLR
40.0000 mg | Freq: Two times a day (BID) | INTRAVENOUS | Status: DC
Start: 2013-06-29 — End: 2013-07-05
  Administered 2013-06-29 – 2013-07-05 (×12): 40 mg via INTRAVENOUS
  Filled 2013-06-29 (×14): qty 40

## 2013-06-29 MED ORDER — ENOXAPARIN SODIUM 40 MG/0.4ML ~~LOC~~ SOLN
40.0000 mg | SUBCUTANEOUS | Status: DC
  Administered 2013-06-29 – 2013-07-05 (×7): 40 mg via SUBCUTANEOUS
  Filled 2013-06-29 (×7): qty 0.4

## 2013-06-29 MED ORDER — HYDROMORPHONE HCL PF 1 MG/ML IJ SOLN
0.5000 mg | INTRAMUSCULAR | Status: DC | PRN
  Administered 2013-07-02 – 2013-07-05 (×8): 1 mg via INTRAVENOUS
  Filled 2013-06-29 (×9): qty 1

## 2013-06-29 MED ORDER — ONDANSETRON HCL 4 MG PO TABS: 4.0000 mg | ORAL_TABLET | Freq: Four times a day (QID) | ORAL | Status: DC | PRN

## 2013-06-29 MED ORDER — ONDANSETRON HCL 4 MG/2ML IJ SOLN
4.0000 mg | Freq: Four times a day (QID) | INTRAMUSCULAR | Status: DC | PRN
  Administered 2013-07-03: 4 mg via INTRAVENOUS
  Filled 2013-06-29: qty 2

## 2013-06-29 MED ORDER — ALUM & MAG HYDROXIDE-SIMETH 200-200-20 MG/5ML PO SUSP
30.0000 mL | Freq: Four times a day (QID) | ORAL | Status: DC | PRN
  Administered 2013-06-29 – 2013-07-03 (×5): 30 mL via ORAL
  Filled 2013-06-29 (×5): qty 30

## 2013-06-29 MED ORDER — SODIUM CHLORIDE 0.9 % IV SOLN: 200.0000 mg/m2/d | INTRAVENOUS | Status: DC

## 2013-06-29 MED ORDER — NICOTINE 14 MG/24HR TD PT24: 14.0000 mg | MEDICATED_PATCH | Freq: Every day | TRANSDERMAL | Status: DC

## 2013-06-29 MED ORDER — LIDOCAINE-PRILOCAINE 2.5-2.5 % EX CREA: TOPICAL_CREAM | Freq: Once | CUTANEOUS | Status: DC

## 2013-06-29 MED ORDER — SENNOSIDES-DOCUSATE SODIUM 8.6-50 MG PO TABS
1.0000 | ORAL_TABLET | Freq: Two times a day (BID) | ORAL | Status: DC
  Administered 2013-06-29 – 2013-07-05 (×10): 1 via ORAL
  Filled 2013-06-29 (×13): qty 1

## 2013-06-29 MED ORDER — NICOTINE 21 MG/24HR TD PT24
21.0000 mg | MEDICATED_PATCH | Freq: Every day | TRANSDERMAL | Status: DC
  Administered 2013-06-29 – 2013-07-05 (×7): 21 mg via TRANSDERMAL
  Filled 2013-06-29 (×7): qty 1

## 2013-06-29 MED ORDER — BACLOFEN 1 MG/ML ORAL SUSPENSION: 5.0000 mg | Freq: Two times a day (BID) | ORAL | Status: DC

## 2013-06-29 MED ORDER — CHLORPROMAZINE HCL 10 MG PO TABS: 10.0000 mg | ORAL_TABLET | Freq: Four times a day (QID) | ORAL | Status: DC | PRN

## 2013-06-29 MED ORDER — GI COCKTAIL ~~LOC~~
30.0000 mL | Freq: Once | ORAL | Status: AC
  Administered 2013-06-29: 30 mL via ORAL
  Filled 2013-06-29: qty 30

## 2013-06-29 MED ORDER — MONTELUKAST SODIUM 10 MG PO TABS
10.0000 mg | ORAL_TABLET | Freq: Every day | ORAL | Status: DC
  Administered 2013-06-29 – 2013-07-05 (×7): 10 mg via ORAL
  Filled 2013-06-29 (×7): qty 1

## 2013-06-29 MED ORDER — DIPHENHYDRAMINE HCL 25 MG PO CAPS: 25.0000 mg | ORAL_CAPSULE | Freq: Every evening | ORAL | Status: DC | PRN

## 2013-06-29 MED ORDER — SODIUM CHLORIDE 0.9 % IV SOLN
INTRAVENOUS | Status: DC
  Administered 2013-06-29 (×2): via INTRAVENOUS
  Administered 2013-06-29: 1000 mL via INTRAVENOUS
  Administered 2013-06-30 – 2013-07-04 (×9): via INTRAVENOUS
  Administered 2013-07-04: 1000 mL via INTRAVENOUS
  Administered 2013-07-05 (×2): via INTRAVENOUS

## 2013-06-29 MED ORDER — SODIUM CHLORIDE 0.9 % IV SOLN: INTRAVENOUS | Status: AC

## 2013-06-29 MED ORDER — ACETAMINOPHEN 325 MG PO TABS: 650.0000 mg | ORAL_TABLET | Freq: Four times a day (QID) | ORAL | Status: DC | PRN

## 2013-06-29 MED ORDER — SUCRALFATE 1 GM/10ML PO SUSP
1.0000 g | Freq: Three times a day (TID) | ORAL | Status: DC
  Administered 2013-06-29 – 2013-07-05 (×25): 1 g via ORAL
  Filled 2013-06-29 (×30): qty 10

## 2013-06-29 MED ORDER — INSULIN ASPART 100 UNIT/ML ~~LOC~~ SOLN
0.0000 [IU] | SUBCUTANEOUS | Status: DC
  Administered 2013-06-29: 1 [IU] via SUBCUTANEOUS
  Administered 2013-06-29: 0 [IU] via SUBCUTANEOUS
  Administered 2013-06-29 – 2013-06-30 (×4): 1 [IU] via SUBCUTANEOUS

## 2013-06-29 MED ORDER — PANTOPRAZOLE SODIUM 40 MG IV SOLR
40.0000 mg | INTRAVENOUS | Status: AC
  Administered 2013-06-29: 40 mg via INTRAVENOUS
  Filled 2013-06-29: qty 40

## 2013-06-29 MED ORDER — SODIUM CHLORIDE 0.9 % IV SOLN
Freq: Once | INTRAVENOUS | Status: AC
  Administered 2013-06-29: 100 mL/h via INTRAVENOUS

## 2013-06-29 MED ORDER — BOOST / RESOURCE BREEZE PO LIQD
1.0000 | Freq: Three times a day (TID) | ORAL | Status: DC
  Administered 2013-06-29: 1 via ORAL

## 2013-06-29 MED ORDER — LORAZEPAM 0.5 MG PO TABS
0.5000 mg | ORAL_TABLET | Freq: Four times a day (QID) | ORAL | Status: DC | PRN
  Administered 2013-06-30 (×2): 0.5 mg via ORAL
  Filled 2013-06-29 (×2): qty 1

## 2013-06-29 NOTE — H&P (Signed)
[name] History and Physical  [name]:[id number] DOB: 03/08/1955 DOA: 06/28/2013  Referring physician:  EDP PCP: Gaspar Garbe, MD  Specialists:   Chief Complaint: ABD Pain Heartburn and Hiccups  HPI: Devin Gonzalez is a 58 y.o. male with a history of Metastatic Esophageal Cancer S/P radiation Rx who started a continuous Chemotherapy infusion on 06/27/2013 who presents to the ED with complaints of epigastric ABD pain, increased reflux symptoms, heartburn and hiccups for the past 2 days.   He also has had nausea and vomiting but no diarrhea.  He denies having fevers or chills or SOB.   He was evaluated in the Ed and was found to have a Lipase level of 161 and he was referred for medical admission  Review of Systems:  Constitutional: No Weight Loss, No Weight Gain, Night Sweats, Fevers, Chills, Fatigue, or Generalized Weakness HEENT: No Headaches, Difficulty Swallowing,Tooth/Dental Problems,Sore Throat,  No Sneezing, Rhinitis, Ear Ache, Nasal Congestion, or Post Nasal Drip,  Cardio-vascular:  No Chest pain, Orthopnea, PND, Edema in lower extremities, Anasarca, Dizziness, Palpitations  Resp: No Dyspnea, No DOE, No Cough, No Hemoptysis, No Wheezing.    GI: +Hiccups,  +Heartburn, +Indigestion,+Abdominal Pain, +Nausea, Vomiting, Diarrhea, Change in Bowel Habits,  Loss of Appetite  GU: No Dysuria, Change in Color of Urine, No Urgency or Frequency.  No flank pain.  Musculoskeletal: No Joint Pain or Swelling.  No Decreased Range of Motion. No Back Pain.  Neurologic: No Syncope, No Seizures, Muscle Weakness, Paresthesia, Vision Disturbance or Loss, No Diplopia, No Vertigo, No Difficulty Walking,  Skin: No Rash or Lesions. Psych: No Change in Mood or Affect. No Depression or Anxiety. No Memory loss. No Confusion or Hallucinations   Past Medical History  Diagnosis Date  . Allergy   . Hemorrhoids   . Back pain     right side  . Bunion, right foot   . Left tennis elbow    . Osteoarthritis     hands, knees, right hip  . Prostatitis   . Diverticulosis 2013  . Adenomatous colon polyp   . ED (erectile dysfunction)   . Tobacco abuse   . Reflux esophagitis   . Nonspecific elevation of levels of transaminase or lactic acid dehydrogenase (LDH)         Esophageal Cancer with Metastatic Disease to the Bone   Past Surgical History  Procedure Laterality Date  . Cyst on testicle Right   . Appendectomy    . Tonsillectomy    . Knee surgery Bilateral     arthroscopic surg /  bil knees  . Fatty tumor Right     removal forearm  . Colonoscopy w/ polypectomy    . Sphincterotomy  08/28/2011    Procedure: SPHINCTEROTOMY;  Surgeon: Clovis Pu. Cornett, MD;  Location: [facility];  Service: General;  Laterality: N/A;  Lateral internal sphincterotomy  . Hemorrhoid surgery  08/28/2011    Procedure: HEMORRHOIDECTOMY;  Surgeon: Clovis Pu. Cornett, MD;  Location: [facility];  Service: General;  Laterality: N/A;  ,possible hemorrhoidectomy  . Examination under anesthesia  12/13/2011    Procedure: EXAM UNDER ANESTHESIA;  Surgeon: Maisie Fus A. Cornett, MD;  Location: [facility];  Service: General;  Laterality: N/A;  . Anal fissure repair  2013    Cornett       Prior to Admission medications   Medication Sig Start Date End Date Taking? Authorizing Mikylah Ackroyd  dexamethasone (DECADRON) 4 MG tablet Take 2 tablets by mouth once a day on the day  after chemotherapy and then take 2 tablets two times a day for 2 days. Take with food. 06/04/13  Yes Myra Rude, MD  DiphenhydrAMINE HCl (BENADRYL ALLERGY PO) Take 25 mg by mouth at bedtime.    Yes Historical Vannesa Abair, MD  lidocaine-prilocaine (EMLA) cream Apply to port site one hour before infusion and cover with plastic wrap 06/17/13  Yes Myra Rude, MD  LORazepam (ATIVAN) 0.5 MG tablet Take 1 tablet (0.5 mg total) by mouth every 6 (six) hours as needed (Nausea or vomiting). 06/04/13  Yes Myra Rude, MD  montelukast (SINGULAIR) 10 MG tablet Take 10 mg by mouth  daily.  06/05/13  Yes Historical Everly Rubalcava, MD  nicotine (NICODERM CQ) 21 mg/24hr patch Place 1 patch (21 mg total) onto the skin daily. apply 21 mg patch daily x6wk, then 14 mg patch daily x2wk, then 7 mg patch daily x2wk 06/16/13  Yes Lonie Peak, MD  Phenyleph-Doxylamine-DM-APAP (ALKA SELTZER PLUS PO) Take 2 capsules by mouth as needed (For reflux).    Yes Historical Phillipa Morden, MD  prochlorperazine (COMPAZINE) 10 MG tablet Take 1 tablet (10 mg total) by mouth every 6 (six) hours as needed (Nausea or vomiting). 06/04/13  Yes Myra Rude, MD  ranitidine (ZANTAC) 150 MG tablet Take 1 tablet (150 mg total) by mouth 2 (two) times daily. 06/23/13  Yes Lonie Peak, MD  sucralfate (CARAFATE) 1 GM/10ML suspension Take 10 mLs (1 g total) by mouth 4 (four) times daily -  with meals and at bedtime. (Use before meals) 06/06/13  Yes Lonie Peak, MD  Tetrahydrozoline HCl (VISINE OP) Place 2-4 drops into both eyes 2 (two) times daily as needed. For dry eyes   Yes Historical Adrijana Haros, MD  chlorproMAZINE (THORAZINE) 10 MG tablet Take 1 tablet (10 mg total) by mouth 4 (four) times daily as needed for hiccoughs. 06/29/13   Junius Finner, PA-C  nicotine (NICODERM CQ) 14 mg/24hr patch Place 1 patch (14 mg total) onto the skin daily. apply 21 mg patch daily x6wk, then 14 mg patch daily x2wk, then 7 mg patch daily x2wk 06/16/13   Lonie Peak, MD  nicotine (NICODERM CQ) 7 mg/24hr patch Place 1 patch (7 mg total) onto the skin daily. apply 21 mg patch daily x6wk, then 14 mg patch daily x2wk, then 7 mg patch daily x2wk 06/16/13   Lonie Peak, MD  ondansetron (ZOFRAN) 8 MG tablet Take 1 tablet (8 mg total) by mouth 2 (two) times daily as needed. Start on the third day after chemotherapy. 06/04/13   Myra Rude, MD  oxyCODONE (ROXICODONE) 5 MG/5ML solution Take 5 mLs (5 mg total) by mouth every 4 (four) hours as needed for moderate pain or severe pain. Take miralax or other laxatives as needed to avoid constipation with this 05/13/13   Iva Boop, MD    No Known Allergies   Social History:  reports that he quit smoking 3 days ago. His smoking use included Cigarettes. He has a 10 pack-year smoking history. He has never used smokeless tobacco. He reports that he drinks alcohol. He reports that he uses illicit drugs.     Family History  Problem Relation Age of Onset  . Lung cancer Mother   . Cancer Father   . Lung cancer Brother   . Stroke Brother       Physical Exam:  GEN:  Pleasant Cachectic 58 y.o. African American male examined and in no acute distress; cooperative with exam Filed Vitals:   06/28/13 2132 06/28/13 2207 06/29/13 1610  BP: 162/85  143/81  Pulse: 75  63  Temp: 97.9 F (36.6 C)  97.5 F (36.4 C)  TempSrc: Oral  Oral  Resp: 18  16  Height:  5\' 4"  (1.626 m)   Weight:  50.803 kg (112 lb)   SpO2: 99%  97%   Blood pressure 143/81, pulse 63, temperature 97.5 F (36.4 C), temperature source Oral, resp. rate 16, height 5\' 4"  (1.626 m), weight 50.803 kg (112 lb), SpO2 97.00%. PSYCH: He is alert and oriented x 4; does not appear anxious does not appear depressed; affect is normal HEENT: Normocephalic and Atraumatic, Mucous membranes pink; PERRLA; EOM intact; Fundi:  Benign;  No scleral icterus, Nares: Patent, Oropharynx: Clear, Fair Dentition, Neck:  FROM, no cervical lymphadenopathy nor thyromegaly or carotid bruit; no JVD; Breasts:: Not examined CHEST WALL: No tenderness CHEST: Normal respiration, clear to auscultation bilaterally HEART: Regular rate and rhythm; no murmurs rubs or gallops BACK: No kyphosis or scoliosis; no CVA tenderness ABDOMEN: Positive Bowel Sounds, Scaphoid, soft non-tender; no masses, no organomegaly. Rectal Exam: Not done EXTREMITIES: No cyanosis, clubbing or edema; no ulcerations. Genitalia: not examined PULSES: 2+ and symmetric SKIN: Normal hydration no rash or ulceration CNS:  Alert and Oriented x 4 , No Focal Deficits.     Vascular: pulses palpable throughout     Labs on Admission:  Basic Metabolic Panel:  Recent Labs Lab 06/25/13 1039 06/28/13 2349  NA 143 141  K 4.0 4.5  CL  --  101  CO2 30* 29  GLUCOSE 156* 161*  BUN 9.0 22  CREATININE 1.1 0.98  CALCIUM 9.7 8.6   Liver Function Tests:  Recent Labs Lab 06/28/13 2349  AST 24  ALT 28  ALKPHOS 73  BILITOT 0.2*  PROT 6.6  ALBUMIN 3.1*    Recent Labs Lab 06/28/13 2349  LIPASE 161*   No results found for this basename: AMMONIA,  in the last 168 hours CBC:  Recent Labs Lab 06/25/13 1039 06/28/13 2349  WBC 7.1 7.2  NEUTROABS 5.3 6.8  HGB 15.4 14.1  HCT 46.6 39.6  MCV 90.6 85.5  PLT 268 251   Cardiac Enzymes: No results found for this basename: CKTOTAL, CKMB, CKMBINDEX, TROPONINI,  in the last 168 hours  BNP (last 3 results) No results found for this basename: PROBNP,  in the last 8760 hours CBG: No results found for this basename: GLUCAP,  in the last 168 hours  Radiological Exams on Admission: No results found.   EKG: Reviewed   Normal sinus Rhythm without acute S-T Changes,  LVH  Changes present, and rate = 70   Assessment/Plan:   58 y.o. male with  Principal Problem:   Acute pancreatitis Active Problems:   Malignant neoplasm of lower third of esophagus   Epigastric abdominal pain   Hyperglycemia   Tobacco abuse    1.  Acute Pancreatitis-   IV dilaudid for pain Control PRN,  IVFs, Clear Liquid Diet.  IV Protonix.  Monitor Lipase levels.    2.  Epigastric ABD Pain- due to #1, and Due to Esophageal Cancer S/P Radiation Rx and chemo.  Pain control PRN.    3.  Malignant Metastatic Esophageal Cancer-  completed Radiation Rx, and now receiving a continuous Chemotherapy Infusion Since 06/27/2013  .  Notify Oncology (Dr. Juliette Alcide et al. ) this AM.    4.  Hyperglycemia-  Monitor Glucose levels  And SSI coverage PRN.     5.  Tobacco Abuse-   Nicotine Patch daily.  Continue smoking cessation.     6.  DVT Prophylaxis with Lovenox.      Code Status:    FULL CODE   Family Communication:    Wife at Bedside Disposition Plan:    Inpatient  Time spent:  17 Minutes  Ron Parker [name] Pager [phone]  If 7PM-7AM, please contact night-coverage [email or web address] Password [facility] 06/29/2013, 5:05 AM

## 2013-06-29 NOTE — Consult Note (Signed)
[place] NOTE  Patient Care Team: [name], MD as PCP - General (Internal Medicine) Concha Norway, MD as Consulting Physician (Internal Medicine) Carola Frost, RN as Registered Nurse  CHIEF COMPLAINTS/PURPOSE OF CONSULTATION:  Patient with metastatic esophageal cancer, admitted while on chemotherapy  HISTORY OF PRESENTING ILLNESS:  Devin Gonzalez 58 y.o. male is here because of uncontrolled hiccups and severe mid epigastric pain. I reviewed his records extensively. This patient follow with Dr. 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 The patient had completed palliative radiation therapy last week and was started on palliative chemotherapy with epirubicin, cisplatin and 5-FU. The patient complained of severe, uncontrolled hiccups since last week and inability to keep medicine down due to hiccups. He denies new neurological deficits. He denies nausea. He is constipated for 3 days. Complaining of severe epigastric pain, of which he rated at 8/10 pain prior to admission, partially relieved with intravenous pain medicine. The pain is described as burning sensation. No radiation elsewhere. He was prescribed by mouth oxycodone in the outpatient of which he cannot tolerate as pills kept coming up from hiccups. He denies any recent fever, chills, night sweats or abnormal weight loss  MEDICAL HISTORY:  Past Medical History  Diagnosis Date  . Allergy   . Hemorrhoids   . Back pain     right side  . Bunion, right foot   . Left tennis elbow   . Osteoarthritis     hands, knees, right hip  . Prostatitis   . Diverticulosis 2013  . Adenomatous colon polyp   . ED (erectile dysfunction)   . Tobacco abuse   . Reflux esophagitis   . Nonspecific elevation of levels of transaminase or lactic acid dehydrogenase (LDH)     SURGICAL HISTORY: Past Surgical History  Procedure Laterality  Date  . Cyst on testicle Right   . Appendectomy    . Tonsillectomy    . Knee surgery Bilateral     arthroscopic surg /  bil knees  . Fatty tumor Right     removal forearm  . Colonoscopy w/ polypectomy    . Sphincterotomy  08/28/2011    Procedure: SPHINCTEROTOMY;  Surgeon: Joyice Faster. Cornett, MD;  Location: [facility];  Service: General;  Laterality: N/A;  Lateral internal sphincterotomy  . Hemorrhoid surgery  08/28/2011    Procedure: HEMORRHOIDECTOMY;  Surgeon: Joyice Faster. Cornett, MD;  Location: [facility];  Service: General;  Laterality: N/A;  ,possible hemorrhoidectomy  . Examination under anesthesia  12/13/2011    Procedure: EXAM UNDER ANESTHESIA;  Surgeon: Marcello Moores A. Cornett, MD;  Location: [facility];  Service: General;  Laterality: N/A;  . Anal fissure repair  2013    Cornett    SOCIAL HISTORY: History   Social History  . Marital Status: Single    Spouse Name: N/A    Number of Children: N/A  . Years of Education: N/A   Occupational History  . Not on file.   Social History Main Topics  . Smoking status: Former Smoker -- 0.50 packs/day for 20 years    Types: Cigarettes    Quit date: 06/26/2013  . Smokeless tobacco: Never Used  . Alcohol Use: Yes     Comment: stopped 7 weeks ago 06/28/13  . Drug Use: Yes  . Sexual Activity: Not on file   Other Topics Concern  . Not on file   Social History Narrative  . No  narrative on file    FAMILY HISTORY: Family History  Problem Relation Age of Onset  . Lung cancer Mother   . Cancer Father   . Lung cancer Brother   . Stroke Brother     ALLERGIES:  has No Known Allergies.  MEDICATIONS:  Current Facility-Administered Medications  Medication Dose Route Frequency Provider Last Rate Last Dose  . 0.9 %  sodium chloride infusion   Intravenous STAT Mariea Clonts, MD 100 mL/hr at 06/29/13 0436 100 mL/hr at 06/29/13 0436  . 0.9 %  sodium chloride infusion   Intravenous Continuous Theressa Millard, MD 100 mL/hr at 06/29/13 1224 1,000 mL at  06/29/13 1224  . acetaminophen (TYLENOL) tablet 650 mg  650 mg Oral Q6H PRN Theressa Millard, MD       Or  . acetaminophen (TYLENOL) suppository 650 mg  650 mg Rectal Q6H PRN Theressa Millard, MD      . alum & mag hydroxide-simeth (MAALOX/MYLANTA) 200-200-20 MG/5ML suspension 30 mL  30 mL Oral Q6H PRN Theressa Millard, MD   30 mL at 06/29/13 1245  . diphenhydrAMINE (BENADRYL) capsule 25 mg  25 mg Oral QHS PRN Theressa Millard, MD      . enoxaparin (LOVENOX) injection 40 mg  40 mg Subcutaneous Q24H Theressa Millard, MD   40 mg at 06/29/13 1002  . fluorouracil (ADRUCIL) 2,200 mg in sodium chloride 0.9 % 150 mL chemo infusion  200 mg/m2/day Intravenous 7 days Belkys A Regalado, MD      . HYDROmorphone (DILAUDID) injection 0.5-1 mg  0.5-1 mg Intravenous Q3H PRN Theressa Millard, MD      . insulin aspart (novoLOG) injection 0-9 Units  0-9 Units Subcutaneous 6 times per day Theressa Millard, MD   1 Units at 06/29/13 1221  . lidocaine-prilocaine (EMLA) cream   Topical Once Theressa Millard, MD      . LORazepam (ATIVAN) tablet 0.5 mg  0.5 mg Oral Q6H PRN Theressa Millard, MD      . montelukast (SINGULAIR) tablet 10 mg  10 mg Oral Daily Theressa Millard, MD   10 mg at 06/29/13 1001  . nicotine (NICODERM CQ - dosed in mg/24 hours) patch 21 mg  21 mg Transdermal Daily Theressa Millard, MD   21 mg at 06/29/13 1002  . ondansetron (ZOFRAN) injection 4 mg  4 mg Intravenous Q8H PRN Mariea Clonts, MD      . ondansetron [facility]) tablet 4 mg  4 mg Oral Q6H PRN Theressa Millard, MD       Or  . ondansetron (ZOFRAN) injection 4 mg  4 mg Intravenous Q6H PRN Harvette Evonnie Dawes, MD      . oxyCODONE (ROXICODONE) 5 MG/5ML solution 5 mg  5 mg Oral Q4H PRN Theressa Millard, MD      . pantoprazole (PROTONIX) injection 40 mg  40 mg Intravenous Q12H Belkys A Regalado, MD      . senna-docusate (Senokot-S) tablet 1 tablet  1 tablet Oral BID Belkys A Regalado, MD      . sucralfate (CARAFATE) 1 GM/10ML  suspension 1 g  1 g Oral TID WC & HS Theressa Millard, MD   1 g at 06/29/13 1220    REVIEW OF SYSTEMS:   Constitutional: Denies fevers, chills or abnormal night sweats Eyes: Denies blurriness of vision, double vision or watery eyes Ears, nose, mouth, throat, and face: Denies mucositis or sore throat Respiratory: Denies cough, dyspnea  or wheezes Cardiovascular: Denies palpitation, chest discomfort or lower extremity swelling Skin: Denies abnormal skin rashes Lymphatics: Denies new lymphadenopathy or easy bruising Neurological:Denies numbness, tingling or new weaknesses Behavioral/Psych: Mood is stable, no new changes  All other systems were reviewed with the patient and are negative.  PHYSICAL EXAMINATION: ECOG PERFORMANCE STATUS: 1 - Symptomatic but completely ambulatory  Filed Vitals:   06/29/13 1106  BP: 142/74  Pulse: 79  Temp: 98.4 F (36.9 C)  Resp: 16   Filed Weights   06/28/13 2207 06/29/13 0543  Weight: 112 lb (50.803 kg) 119 lb 0.8 oz (54 kg)    GENERAL:alert, no distress and comfortable. He looks thin and mildly cachectic SKIN: skin color, texture, turgor are normal, no rashes or significant lesions EYES: normal, conjunctiva are pink and non-injected, sclera clear OROPHARYNX:no exudate, no erythema and lips, buccal mucosa, and tongue normal  NECK: supple, thyroid normal size, non-tender, without nodularity LYMPH:  no palpable lymphadenopathy in the cervical, axillary or inguinal LUNGS: clear to auscultation and percussion with normal breathing effort HEART: regular rate & rhythm and no murmurs and no lower extremity edema ABDOMEN:abdomen soft, mild tenderness in the epigastrium area with no rebound or guarding, non-tender and normal bowel sounds Musculoskeletal:no cyanosis of digits and no clubbing  PSYCH: alert & oriented x 3 with fluent speech NEURO: no focal motor/sensory deficits  LABORATORY DATA:  I have reviewed the data as listed Lab Results   Component Value Date   WBC 8.8 06/29/2013   HGB 13.7 06/29/2013   HCT 39.3 06/29/2013   MCV 86.0 06/29/2013   PLT 213 06/29/2013    Recent Labs  05/13/13 1719  06/04/13 0937 06/18/13 0816 06/25/13 1039 06/28/13 2349 06/29/13 0730  NA 138  < > 143 142 143 141 138  K 5.0  < > 4.0 3.8 4.0 4.5 4.7  CL 103  --   --   --   --  101 102  CO2 27  < > 20* 27 30* 29 25  GLUCOSE 88  < > 105 115 156* 161* 149*  BUN 10  < > 6.6* 9.8 9.0 22 23  CREATININE 1.0  < > 0.8 0.9 1.1 0.98 0.94  CALCIUM 9.1  < > 9.4 9.5 9.7 8.6 8.4  GFRNONAA  --   --   --   --   --  >90 >90  GFRAA  --   --   --   --   --  >90 >90  PROT 7.4  --  7.5 7.0  --  6.6  --   ALBUMIN 3.8  --  3.6 3.4*  --  3.1*  --   AST 34  --  38* 25  --  24  --   ALT 37  --  37 19  --  28  --   ALKPHOS 70  --  74 69  --  73  --   BILITOT 1.1  --  0.52 0.33  --  0.2*  --   < > = values in this interval not displayed.  RADIOGRAPHIC STUDIES: I have personally reviewed the radiological images as listed and agreed with the findings in the report. Dg Abd 1 View  06/29/2013   CLINICAL DATA:  Abdominal pain.  EXAM: ABDOMEN - 1 VIEW  COMPARISON:  None.  FINDINGS: Single supine view abdomen and pelvis. The high abdomen is partially excluded. Minimal motion degradation. Non-obstructive bowel gas pattern. Moderate amount of ascending and descending colonic stool. Distal  gas and stool. No abnormal abdominal calcifications. No appendicolith.  IMPRESSION: No acute findings.  Possible constipation.  Decreased sensitivity and specificity exam due to technique related factors, as described above.   Electronically Signed   By: Abigail Miyamoto M.D.   On: 06/29/2013 12:27   Mr Jeri Cos EP Contrast  06/19/2013   CLINICAL DATA:  Esophageal cancer with vision changes. Staging study.  EXAM: MRI HEAD WITHOUT AND WITH CONTRAST  TECHNIQUE: Multiplanar, multiecho pulse sequences of the brain and surrounding structures were obtained without and with intravenous contrast.   CONTRAST:  27mL MULTIHANCE GADOBENATE DIMEGLUMINE 529 MG/ML IV SOLN  COMPARISON:  None.  FINDINGS: There is no acute infarct. There is mild generalized cerebral atrophy. There are a few small foci of T2 hyperintensity within the deep cerebral white matter bilaterally, nonspecific and not greater than expected for patient's age. There is a 3 mm focus of cortical enhancement in the right superior frontal gyrus (series 10, image 51). No other abnormal enhancement is identified. There is no midline shift, intracranial hemorrhage, or extra-axial fluid collection.  Orbits are unremarkable. There is mild-to-moderate right maxillary sinus mucosal thickening. Mastoid air cells are clear. Major intracranial vascular flow voids are preserved.  IMPRESSION: Single 3 mm enhancing lesion in the right frontal lobe, concerning for solitary metastasis.   Electronically Signed   By: Logan Bores   On: 06/19/2013 11:09   Nm Pet Image Initial (pi) Skull Base To Thigh  06/03/2013   CLINICAL DATA:  Initial treatment strategy for esophageal carcinoma.  EXAM: NUCLEAR MEDICINE PET SKULL BASE TO THIGH  TECHNIQUE: 6.7 mCi F-18 FDG was injected intravenously. Full-ring PET imaging was performed from the skull base to thigh after the radiotracer. CT data was obtained and used for attenuation correction and anatomic localization.  FASTING BLOOD GLUCOSE:  Value: 96 mg/dl  COMPARISON:  CT on 05/19/2013  FINDINGS: NECK  No hypermetabolic lymph nodes in the neck.  CHEST  Primary hypermetabolic mass is seen in the distal thoracic esophagus which has a maximum SUV of 19.9, consistent with primary esophageal carcinoma.  Small hypermetabolic paraesophageal lymph nodes are also seen within the middle mediastinum superior to this mass extending to the level of the carina. A 1.2 cm hypermetabolic mediastinal lymph node is seen in the AP window which has a SUV max of 12.2, and a 1 cm hypermetabolic lymph node is also seen in the high right paratracheal  region on image 48, which has an SUV max of 3.1.  No hypermetabolic hilar lymph nodes are seen. Mild emphysema noted, but no suspicious pulmonary nodules are identified. In addition, 2 intramuscular foci of hypermetabolic activity are seen within the right shoulder girdle on images 37 and 53, which are highly suspicious for intramuscular metastases.  ABDOMEN/PELVIS  No abnormal hypermetabolic activity within the liver, pancreas, adrenal glands, or spleen.  Hypermetabolic lymphadenopathy is seen in the gastrohepatic ligament, with SUV max of 11.1. A sub-cm hypermetabolic retroperitoneal lymph node is also seen adjacent to the IVC on image 117. No hypermetabolic soft tissue masses or lymphadenopathy identified within the pelvis.  SKELETON  Two hypermetabolic foci are seen within the pelvis in the bilateral iliac bones, suspicious for bone metastases.  IMPRESSION: Hypermetabolic distal esophageal mass, consistent with primary esophageal carcinoma.  Metastatic lymphadenopathy in mediastinum, gastrohepatic ligament, and right abdominal retroperitoneum.  Two intramuscular hypermetabolic foci within right shoulder for a girl, highly suspicious for intramuscular metastases.  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   Electronically Signed   By:  Earle Gell M.D.   On: 06/03/2013 15:50    ASSESSMENT & PLAN:  #1 stage IV metastatic esophageal cancer The symptoms that brought him in to the hospital are unrelated to side effects of chemotherapy. I recommend we proceed with chemotherapy without dosage adjustment no interruptions. His oncologist will continue to follow him tomorrow. #2 uncontrolled hiccups This is likely due to lower esophageal spasm/esophagitis from recent radiation treatment. Continue Maalox, protonix and sucralfate. I recommend a trial of low dose baclofen. Certainly brain metastasis can cause hiccups as well but the MRI of his brain showed very small lesion only which I do not think is the cause of his  hiccups. The patient does not have neurological deficit. #3 uncontrolled pain Continue Dilaudid when necessary. The patient may benefit from fentanyl patch in the future if continued to have difficulties tolerating by mouth pain medicine. #4 DVT prophylaxis The patient is on Lovenox. #5 nicotine dependency I spent some time counseling the patient the importance of tobacco cessation. he is currently attempting to quit on his own. He is on nicotine patch.   All questions were answered. The patient knows to call the clinic with any problems, questions or concerns.    [place], [place], MD 06/29/2013 1:12 PM

## 2013-06-29 NOTE — Progress Notes (Signed)
[name] PROGRESS NOTE  Devin Gonzalez FUX:[id number] DOB: 1955/04/09 DOA: 06/28/2013 PCP: [name], MD  Assessment/Plan: 1-Stage IV metastatic esophageal cancer  to mediastinal, lymph nodes and possibly to brain. On chemotherapy, oncology consulted.   2-Epigastric pain; mildly increase lipase, LFT normal. Differential gastritis vs Pancreatitis. Continue with clear diet, IV fluids, Pin medication. He relates constipation. Check KUB. Start IV protonix.   3-Constipation; KUB. Start laxatives.   4-Hyperglycemia- Monitor Glucose levels And SSI coverage PRN.  5. Tobacco Abuse- Nicotine Patch daily. Continue smoking cessation.  6. DVT Prophylaxis with Lovenox.     Code Status: Full Code.  Family Communication: Care discussed with the patient.  Disposition Plan: Remain inpatient.    Consultants:  Oncologist.   Procedures:  none  Antibiotics:  none  HPI/Subjective: Feeling better, still with abdominal pain, no BM last 3 days, was having burning epigastric pain. Relates nausea.   Objective: Filed Vitals:   06/29/13 1106  BP: 142/74  Pulse: 79  Temp: 98.4 F (36.9 C)  Resp: 16   No intake or output data in the 24 hours ending 06/29/13 1112 Filed Weights   06/28/13 2207 06/29/13 0543  Weight: 50.803 kg (112 lb) 54 kg (119 lb 0.8 oz)    Exam:   General:  No distress.   Cardiovascular: S 1, S 2 RRR  Respiratory: CTA  Abdomen: BS present, mild distended, mild tenderness.   Musculoskeletal: no edema.   Data Reviewed: Basic Metabolic Panel:  Recent Labs Lab 06/25/13 1039 06/28/13 2349 06/29/13 0730  NA 143 141 138  K 4.0 4.5 4.7  CL  --  101 102  CO2 30* 29 25  GLUCOSE 156* 161* 149*  BUN 9.0 22 23  CREATININE 1.1 0.98 0.94  CALCIUM 9.7 8.6 8.4   Liver Function Tests:  Recent Labs Lab 06/28/13 2349  AST 24  ALT 28  ALKPHOS 73  BILITOT 0.2*  PROT 6.6  ALBUMIN 3.1*    Recent Labs Lab 06/28/13 2349  LIPASE 161*    No results found for this basename: AMMONIA,  in the last 168 hours CBC:  Recent Labs Lab 06/25/13 1039 06/28/13 2349 06/29/13 0730  WBC 7.1 7.2 8.8  NEUTROABS 5.3 6.8  --   HGB 15.4 14.1 13.7  HCT 46.6 39.6 39.3  MCV 90.6 85.5 86.0  PLT 268 251 213   Cardiac Enzymes: No results found for this basename: CKTOTAL, CKMB, CKMBINDEX, TROPONINI,  in the last 168 hours BNP (last 3 results) No results found for this basename: PROBNP,  in the last 8760 hours CBG:  Recent Labs Lab 06/29/13 0810  GLUCAP 127*    No results found for this or any previous visit (from the past 240 hour(s)).   Studies: No results found.  Scheduled Meds: . sodium chloride   Intravenous STAT  . enoxaparin (LOVENOX) injection  40 mg Subcutaneous Q24H  . 5-FU (ADRUCIL) CHEMO IV infusion pump  200 mg/m2/day Intravenous 7 days  . insulin aspart  0-9 Units Subcutaneous 6 times per day  . lidocaine-prilocaine   Topical Once  . montelukast  10 mg Oral Daily  . nicotine  21 mg Transdermal Daily  . pantoprazole (PROTONIX) IV  40 mg Intravenous Q12H  . senna-docusate  1 tablet Oral BID  . sucralfate  1 g Oral TID WC & HS   Continuous Infusions: . sodium chloride 100 mL/hr at 06/29/13 0254    Principal Problem:   Acute pancreatitis Active Problems:   Malignant neoplasm of  lower third of esophagus   Epigastric abdominal pain   Hyperglycemia   Tobacco abuse    Time spent: 30 minutes.     Devin Gonzalez  [name] Pager [phone]. If 7PM-7AM, please contact night-coverage at [email or web address], password [email or web address] 06/29/2013, 11:12 AM  LOS: 1 day

## 2013-06-30 ENCOUNTER — Encounter: Payer: Self-pay | Admitting: Certified Registered Nurse Anesthetist

## 2013-06-30 DIAGNOSIS — E43 Unspecified severe protein-calorie malnutrition: Secondary | ICD-10-CM | POA: Diagnosis present

## 2013-06-30 DIAGNOSIS — H539 Unspecified visual disturbance: Secondary | ICD-10-CM

## 2013-06-30 LAB — GLUCOSE, CAPILLARY
Glucose-Capillary: 107 mg/dL — ABNORMAL HIGH (ref 70–99)
Glucose-Capillary: 105 mg/dL — ABNORMAL HIGH (ref 70–99)
Glucose-Capillary: 102 mg/dL — ABNORMAL HIGH (ref 70–99)
Glucose-Capillary: 139 mg/dL — ABNORMAL HIGH (ref 70–99)
Glucose-Capillary: 121 mg/dL — ABNORMAL HIGH (ref 70–99)

## 2013-06-30 LAB — LIPASE, BLOOD: Lipase: 24 U/L (ref 11–59)

## 2013-06-30 MED ORDER — FLUOROURACIL CHEMO INJECTION 5 GM/100ML
200.0000 mg/m2/d | INTRAVENOUS | Status: AC
  Administered 2013-06-30: 300 mg via INTRAVENOUS
  Filled 2013-06-30: qty 6

## 2013-06-30 MED ORDER — BOOST PLUS PO LIQD
237.0000 mL | Freq: Three times a day (TID) | ORAL | Status: DC
  Administered 2013-06-30 – 2013-07-05 (×8): 237 mL via ORAL
  Filled 2013-06-30 (×17): qty 237

## 2013-06-30 MED ORDER — ENSURE PUDDING PO PUDG
1.0000 | Freq: Once | ORAL | Status: AC
  Administered 2013-06-30: 1 via ORAL
  Filled 2013-06-30: qty 1

## 2013-06-30 MED ORDER — UNJURY CHOCOLATE CLASSIC POWDER
2.0000 [oz_av] | Freq: Once | ORAL | Status: AC
  Administered 2013-06-30: 2 [oz_av] via ORAL
  Filled 2013-06-30: qty 27

## 2013-06-30 MED ORDER — BISACODYL 10 MG RE SUPP
10.0000 mg | Freq: Every day | RECTAL | Status: DC | PRN
  Administered 2013-07-05: 10 mg via RECTAL
  Filled 2013-06-30: qty 1

## 2013-06-30 NOTE — ED Provider Notes (Signed)
Medical screening examination/treatment/procedure(s) were conducted as a shared visit with non-physician practitioner(s) or resident and myself. I personally evaluated the patient during the encounter and agree with the findings and plan unless otherwise indicated.  I have personally reviewed any xrays and/ or EKG's with the provider and I agree with interpretation.  Patient with esophagus cancer, new chemotherapy on Thursday presents with burning and reflux symptoms and worsening hiccups since 5 AM today. Pain is worse with eating and patient has had nausea with one episode of vomiting. Patient has improvements with seltzer plus and nausea medicines however this is lasting longer than it normally does. On exam patient has mild dehydration, mild dry meters membranes, normal heart rate, abdomen soft mild epig tenderness, overall well-appearing. Plan for blood work, medicines for nausea and hiccups and followup outpatient. On recheck mild improvement, IV fluids and paged [facility].  The patients results and plan were reviewed and discussed.  Any x-rays performed were personally reviewed by myself.  Differential diagnosis were considered with the presenting HPI.  Medications   0.9 % sodium chloride infusion (not administered)   ondansetron (ZOFRAN) injection 4 mg (not administered)   HYDROmorphone (DILAUDID) injection 1 mg (1 mg Intravenous Given 06/29/13 0016)   chlorproMAZINE (THORAZINE) injection 25 mg (25 mg Intramuscular Given 06/29/13 0017)   promethazine (PHENERGAN) injection 25 mg (25 mg Intravenous Given 06/29/13 0015)   gi cocktail (Maalox,Lidocaine,Donnatal) (30 mLs Oral Given 06/29/13 0010)   pantoprazole (PROTONIX) injection 40 mg (40 mg Intravenous Given 06/29/13 0032)   0.9 % sodium chloride infusion (100 mL/hr Intravenous New Bag/Given 06/29/13 0317)    Filed Vitals:    06/28/13 2132  06/28/13 2207  06/29/13 0319   BP:  162/85   143/81   Pulse:  75   63   Temp:  97.9 F (36.6 C)   97.5 F  (36.4 C)   TempSrc:  Oral   Oral   Resp:  18   16   Height:   5\' 4"  (1.626 m)    Weight:   112 lb (50.803 kg)    SpO2:  99%   97%    Admission/ observation were discussed with the admitting physician, patient and/or family and they are comfortable with the plan.  Reflux, Hiccups, Vomiting, Dehydration, Pancreatitis  Labs Reviewed   CBC WITH DIFFERENTIAL - Abnormal; Notable for the following:    Neutrophils Relative %  95 (*)     Lymphocytes Relative  2 (*)     Lymphs Abs  0.2 (*)     All other components within normal limits   LIPASE, BLOOD - Abnormal; Notable for the following:    Lipase  161 (*)     All other components within normal limits   COMPREHENSIVE METABOLIC PANEL - Abnormal; Notable for the following:    Glucose, Bld  161 (*)     Albumin  3.1 (*)     Total Bilirubin  0.2 (*)     All other components within normal limits      Mariea Clonts, MD 06/30/13 [id number]

## 2013-06-30 NOTE — Progress Notes (Signed)
Devin Gonzalez   DOB:07/10/1955   YQ#:[id number]   XBM#:[id number]  Subjective: Patient reports continued hiccups although he notes some improvement.  He complains of water brash c/w with longstanding reflux following his beef broth yesterday evening.  He denies fevers or chills. He requests to restart his ensure with meals.    Objective:  Filed Vitals:   06/30/13 0405  BP: 163/77  Pulse: 64  Temp: 98.2 F (36.8 C)  Resp: 16    Body mass index is 20.42 kg/(m^2).  Intake/Output Summary (Last 24 hours) at 06/30/13 0812 Last data filed at 06/30/13 0600  Gross per 24 hour  Intake   1167 ml  Output   2440 ml  Net  -1273 ml     Sclerae unicteric, thin male in mild distress  Oropharynx clear  No peripheral adenopathy  Lungs clear -- no rales or rhonchi  Heart regular rate and rhythm  Abdomen benign  MSK no focal spinal tenderness, no peripheral edema  Neuro nonfocal   CBG (last 3)   Recent Labs  06/30/13 0001 06/30/13 0403 06/30/13 0726  GLUCAP 114* 107* 105*     Labs:  Lab Results  Component Value Date   WBC 8.8 06/29/2013   HGB 13.7 06/29/2013   HCT 39.3 06/29/2013   MCV 86.0 06/29/2013   PLT 213 06/29/2013   NEUTROABS 6.8 06/28/2013   Basic Metabolic Panel:  Recent Labs Lab 06/25/13 1039 06/28/13 2349 06/29/13 0730  NA 143 141 138  K 4.0 4.5 4.7  CL  --  101 102  CO2 30* 29 25  GLUCOSE 156* 161* 149*  BUN 9.0 22 23  CREATININE 1.1 0.98 0.94  CALCIUM 9.7 8.6 8.4   GFR Estimated Creatinine Clearance: 67 ml/min (by C-G formula based on Cr of 0.94). Liver Function Tests:  Recent Labs Lab 06/28/13 2349  AST 24  ALT 28  ALKPHOS 73  BILITOT 0.2*  PROT 6.6  ALBUMIN 3.1*    Recent Labs Lab 06/28/13 2349  LIPASE 161*   No results found for this basename: AMMONIA,  in the last 168 hours Coagulation profile No results found for this basename: INR, PROTIME,  in the last 168 hours  CBC:  Recent Labs Lab 06/25/13 1039 06/28/13 2349  06/29/13 0730  WBC 7.1 7.2 8.8  NEUTROABS 5.3 6.8  --   HGB 15.4 14.1 13.7  HCT 46.6 39.6 39.3  MCV 90.6 85.5 86.0  PLT 268 251 213   Cardiac Enzymes: No results found for this basename: CKTOTAL, CKMB, CKMBINDEX, TROPONINI,  in the last 168 hours BNP: No components found with this basename: POCBNP,  CBG:  Recent Labs Lab 06/29/13 1705 06/29/13 2025 06/30/13 0001 06/30/13 0403 06/30/13 0726  GLUCAP 112* 130* 114* 107* 105*   D-Dimer No results found for this basename: DDIMER,  in the last 72 hours Hgb A1c  Recent Labs  06/29/13 0730  HGBA1C 6.1*   Studies:  Dg Abd 1 View  06/29/2013   CLINICAL DATA:  Abdominal pain.  EXAM: ABDOMEN - 1 VIEW  COMPARISON:  None.  FINDINGS: Single supine view abdomen and pelvis. The high abdomen is partially excluded. Minimal motion degradation. Non-obstructive bowel gas pattern. Moderate amount of ascending and descending colonic stool. Distal gas and stool. No abnormal abdominal calcifications. No appendicolith.  IMPRESSION: No acute findings.  Possible constipation.  Decreased sensitivity and specificity exam due to technique related factors, as described above.   Electronically Signed   By: Hosie Spangle.D.  On: 06/29/2013 12:27    Assessment: 58 y.o. with the following:   1. Stage IV metastatic esophageal cancer. --We will discontinue his 5-FU pump now and start IV per hospital policy.  His counts are stable.  2. Uncontrolled hiccups. --Likely secondary lower esophageal spasm/ esophagitis s/p recent XRT.  Continue Maalox, protonix and sucralfate.   --Baclofen prn.   3. Uncontrolled pain. --Dialudid prn.  He may benefit fentanyl patch if not tolerating po meds.   4. DVT prophylaxis.  --Lovenox 40 sQ daily.   5. Nicotine dependency.   --Continue nicotine patch.    6. Constipation, possible by KUB above. --Miralax or enema prn.   7. Poor nutrition secondary to #1. --Continue Boost (chocolate ) with meals. Albumin is 3.1.    8. Disposition. --Full Code.    Thanks for taking care of Mr. Prada.    Sebron Mcmahill, MD 06/30/2013  8:12 AM

## 2013-06-30 NOTE — Progress Notes (Signed)
Pt states ensure pudding burns his throat.

## 2013-06-30 NOTE — Progress Notes (Signed)
[name] PROGRESS NOTE  Devin Gonzalez YBW:[id number] DOB: 06-01-1955 DOA: 06/28/2013 PCP: [name], MD  Assessment/Plan: Devin Gonzalez is a 58 y.o. male with a history of Metastatic Esophageal Cancer to mediastinal, lymph nodes and possibly to brain, S/P radiation Rx who started a continuous Chemotherapy infusion on 06/27/2013 who presents to the ED with complaints of epigastric ABD pain, increased reflux symptoms, heartburn and hiccups for the past 2 days. He also has had nausea and vomiting but no diarrhea. He was evaluated in the Ed and was found to have a Lipase level of 161 and he was referred for medical admission. He was started IV protonix, IV fluids, pain management. Dr. Juliann Mule with oncology has been helping with patient care.    1-Stage IV metastatic esophageal cancer  to mediastinal, lymph nodes and possibly to brain. On chemotherapy, oncology following.   2-Epigastric pain; Hiccups. Mildly increase lipase at 161 on admission, LFT normal. Differential gastritis vs Pancreatitis, Esophagitis from radiation treatments. . Continue with  IV fluids, Continue with  IV protonix, Carafate. advance diet. On baclofen for Hiccup.   3-Constipation;KUB negative for obstruction, constipation. Had small bowel movement today. Continue with laxatives.   4-Hyperglycemia- Monitor Glucose levels And SSI coverage PRN.  5. Tobacco Abuse- Nicotine Patch daily. Continue smoking cessation.  6. DVT Prophylaxis with Lovenox.     Code Status: Full Code.  Family Communication: Care discussed with the patient.  Disposition Plan: Remain inpatient.    Consultants:  Oncologist.   Procedures:  none  Antibiotics:  none  HPI/Subjective: Still with epigastric pain, burning sensation. He is willing to try soft diet.  Had small bowel movement   Objective: Filed Vitals:   06/30/13 1444  BP: 170/82  Pulse: 70  Temp: 98.6 F (37 C)  Resp: 18    Intake/Output Summary (Last  24 hours) at 06/30/13 1644 Last data filed at 06/30/13 1447  Gross per 24 hour  Intake    527 ml  Output   2150 ml  Net  -1623 ml   Filed Weights   06/28/13 2207 06/29/13 0543  Weight: 50.803 kg (112 lb) 54 kg (119 lb 0.8 oz)    Exam:   General:  No distress.   Cardiovascular: S 1, S 2 RRR  Respiratory: CTA  Abdomen: BS present, mild distended, mild tenderness.   Musculoskeletal: no edema.   Data Reviewed: Basic Metabolic Panel:  Recent Labs Lab 06/25/13 1039 06/28/13 2349 06/29/13 0730  NA 143 141 138  K 4.0 4.5 4.7  CL  --  101 102  CO2 30* 29 25  GLUCOSE 156* 161* 149*  BUN 9.0 22 23  CREATININE 1.1 0.98 0.94  CALCIUM 9.7 8.6 8.4   Liver Function Tests:  Recent Labs Lab 06/28/13 2349  AST 24  ALT 28  ALKPHOS 73  BILITOT 0.2*  PROT 6.6  ALBUMIN 3.1*    Recent Labs Lab 06/28/13 2349 06/30/13 0842  LIPASE 161* 24   No results found for this basename: AMMONIA,  in the last 168 hours CBC:  Recent Labs Lab 06/25/13 1039 06/28/13 2349 06/29/13 0730  WBC 7.1 7.2 8.8  NEUTROABS 5.3 6.8  --   HGB 15.4 14.1 13.7  HCT 46.6 39.6 39.3  MCV 90.6 85.5 86.0  PLT 268 251 213   Cardiac Enzymes: No results found for this basename: CKTOTAL, CKMB, CKMBINDEX, TROPONINI,  in the last 168 hours BNP (last 3 results) No results found for this basename: PROBNP,  in the  last 8760 hours CBG:  Recent Labs Lab 06/29/13 2025 06/30/13 0001 06/30/13 0403 06/30/13 0726 06/30/13 1122  GLUCAP 130* 114* 107* 105* 102*    No results found for this or any previous visit (from the past 240 hour(s)).   Studies: Dg Abd 1 View  06/29/2013   CLINICAL DATA:  Abdominal pain.  EXAM: ABDOMEN - 1 VIEW  COMPARISON:  None.  FINDINGS: Single supine view abdomen and pelvis. The high abdomen is partially excluded. Minimal motion degradation. Non-obstructive bowel gas pattern. Moderate amount of ascending and descending colonic stool. Distal gas and stool. No abnormal  abdominal calcifications. No appendicolith.  IMPRESSION: No acute findings.  Possible constipation.  Decreased sensitivity and specificity exam due to technique related factors, as described above.   Electronically Signed   By: Abigail Miyamoto M.D.   On: 06/29/2013 12:27    Scheduled Meds: . baclofen  5 mg Oral BID  . enoxaparin (LOVENOX) injection  40 mg Subcutaneous Q24H  . FLUOROURACIL (ADRUCIL) CHEMO infusion For Inpatient Use  200 mg/m2/day (Treatment Plan Actual) Intravenous Q24H  . insulin aspart  0-9 Units Subcutaneous 6 times per day  . lactose free nutrition  237 mL Oral TID WC  . lidocaine-prilocaine   Topical Once  . montelukast  10 mg Oral Daily  . nicotine  21 mg Transdermal Daily  . pantoprazole (PROTONIX) IV  40 mg Intravenous Q12H  . senna-docusate  1 tablet Oral BID  . sucralfate  1 g Oral TID WC & HS   Continuous Infusions: . sodium chloride 100 mL/hr at 06/30/13 8889    Principal Problem:   Acute pancreatitis Active Problems:   Malignant neoplasm of lower third of esophagus   Epigastric abdominal pain   Hyperglycemia   Tobacco abuse   Protein-calorie malnutrition, severe    Time spent: 30 minutes.     Niel Hummer A  [name] Pager [phone]. If 7PM-7AM, please contact night-coverage at [email or web address], password [facility] 06/30/2013, 4:44 PM  LOS: 2 days

## 2013-06-30 NOTE — Progress Notes (Signed)
INITIAL NUTRITION ASSESSMENT  DOCUMENTATION CODES Per approved criteria  -Severe malnutrition in the context of chronic illness  Pt meets criteria for severe MALNUTRITION in the context of chronic illness as evidenced by 11.1% body weight loss in < 3 months, PO intake <75% for one month.   INTERVENTION: -Recommend Boost Plus TID -Provided pt with nutrition supplement coupons -Provided pt with nutrition education handouts for increasing kcal/protein -Trial Ensure pudding and Unjury chocolate pudding; RD to follow up for tolerance -Will continue to monitor  NUTRITION DIAGNOSIS: Inadequate oral intake related to nausea/burning sensations as evidenced by PO intake <75%, 11.1% body weight loss in < 3 months.   Goal: Pt to meet >/= 90% of their estimated nutrition needs    Monitor:  Total protein/energy intake, labs, weights, swallow profile, GI profile, supplement tolerance  Reason for Assessment: MST  58 y.o. male  Admitting Dx: Acute pancreatitis  ASSESSMENT: Devin Gonzalez is a 58 y.o. male with a history of Metastatic Esophageal Cancer S/P radiation Rx who started a continuous Chemotherapy infusion on 06/27/2013 who presents to the ED with complaints of epigastric ABD pain, increased reflux symptoms, heartburn and hiccups for the past 2 days  -Pt endorsed an unintentional wt loss of 20-30 lbs since cancer dx. Has experienced 15 lbs weight loss in past 2 months (11.1 body weight loss, severe for time frame) -Has been trying to drink Ensure or Boost several times daily, and tolerating pureed and gerber baby foods. Was evaluated by  RD on 06/07/2013 for additional puree diet nutrition therapy -Has had miniimal intake since Friday June 6/26 d/t hiccups and burning sensations after fluids and foods. Has been able to tolerate one Ensure and some bites of jello/popsicles during admit -Discussed additional high protein/kcal supplements/foods sources. Provided nutrition education handout  from the Academy [employer] Dietetics, "Increasing Calories and Protein". Will order Boost TID for pt during admit. Provided Boost coupons.  -Pt also willing to trial Ensure Pudding and Unjury Chocolate Mousse for additional options of nutrient replenishment. RD to follow up for tolerance and additional recommendations  Height: Ht Readings from Last 1 Encounters:  06/29/13 5\' 4"  (1.626 m)    Weight: Wt Readings from Last 1 Encounters:  06/29/13 119 lb 0.8 oz (54 kg)    Ideal Body Weight: 130 lbs  % Ideal Body Weight: 92%  Wt Readings from Last 10 Encounters:  06/29/13 119 lb 0.8 oz (54 kg)  06/25/13 112 lb (50.803 kg)  06/23/13 114 lb (51.71 kg)  06/18/13 113 lb 12.8 oz (51.619 kg)  06/16/13 113 lb (51.256 kg)  06/09/13 117 lb (53.071 kg)  06/06/13 116 lb (52.617 kg)  06/04/13 116 lb 9.6 oz (52.889 kg)  05/21/13 116 lb 3.2 oz (52.708 kg)  05/13/13 117 lb (53.071 kg)    Usual Body Weight: 130-135 lbs  % Usual Body Weight: 88%  BMI:  Body mass index is 20.42 kg/(m^2).  Estimated Nutritional Needs: Kcal: 1700-1900 Protein: 100-110 gram Fluid: >/=1700 ml/daily  Skin: WDL  Diet Order: Criss Rosales  EDUCATION NEEDS: -Education needs addressed   Intake/Output Summary (Last 24 hours) at 06/30/13 1334 Last data filed at 06/30/13 1100  Gross per 24 hour  Intake    287 ml  Output   2150 ml  Net  -1863 ml    Last BM: 6/24- constipated, receiving Miralax   Labs:   Recent Labs Lab 06/25/13 1039 06/28/13 2349 06/29/13 0730  NA 143 141 138  K 4.0 4.5 4.7  CL  --  101 102  CO2 30* 29 25  BUN 9.0 22 23  CREATININE 1.1 0.98 0.94  CALCIUM 9.7 8.6 8.4  GLUCOSE 156* 161* 149*    CBG (last 3)   Recent Labs  06/30/13 0403 06/30/13 0726 06/30/13 1122  GLUCAP 107* 105* 102*    Scheduled Meds: . baclofen  5 mg Oral BID  . enoxaparin (LOVENOX) injection  40 mg Subcutaneous Q24H  . feeding supplement (ENSURE)  1 Container Oral Once  . FLUOROURACIL  (ADRUCIL) CHEMO infusion For Inpatient Use  200 mg/m2/day (Treatment Plan Actual) Intravenous Q24H  . insulin aspart  0-9 Units Subcutaneous 6 times per day  . lactose free nutrition  237 mL Oral TID WC  . lidocaine-prilocaine   Topical Once  . montelukast  10 mg Oral Daily  . nicotine  21 mg Transdermal Daily  . pantoprazole (PROTONIX) IV  40 mg Intravenous Q12H  . protein supplement  2 oz Oral Once  . senna-docusate  1 tablet Oral BID  . sucralfate  1 g Oral TID WC & HS    Continuous Infusions: . sodium chloride 100 mL/hr at 06/30/13 3845    Past Medical History  Diagnosis Date  . Allergy   . Hemorrhoids   . Back pain     right side  . Bunion, right foot   . Left tennis elbow   . Osteoarthritis     hands, knees, right hip  . Prostatitis   . Diverticulosis 2013  . Adenomatous colon polyp   . ED (erectile dysfunction)   . Tobacco abuse   . Reflux esophagitis   . Nonspecific elevation of levels of transaminase or lactic acid dehydrogenase (LDH)     Past Surgical History  Procedure Laterality Date  . Cyst on testicle Right   . Appendectomy    . Tonsillectomy    . Knee surgery Bilateral     arthroscopic surg /  bil knees  . Fatty tumor Right     removal forearm  . Colonoscopy w/ polypectomy    . Sphincterotomy  08/28/2011    Procedure: SPHINCTEROTOMY;  Surgeon: Joyice Faster. Cornett, MD;  Location: [place];  Service: General;  Laterality: N/A;  Lateral internal sphincterotomy  . Hemorrhoid surgery  08/28/2011    Procedure: HEMORRHOIDECTOMY;  Surgeon: Joyice Faster. Cornett, MD;  Location: [place];  Service: General;  Laterality: N/A;  ,possible hemorrhoidectomy  . Examination under anesthesia  12/13/2011    Procedure: EXAM UNDER ANESTHESIA;  Surgeon: Marcello Moores A. Cornett, MD;  Location: [place];  Service: General;  Laterality: N/A;  . Anal fissure repair  2013    [place] LDN Clinical Dietitian [phone]

## 2013-06-30 NOTE — Progress Notes (Signed)
Request to call inpt. nurse Alyse Low by [facility] triage nurse Waverly Ferrari in regards to home infusion pump. Pt. was admitted to 3rd floor at [facility] and order to disconnect home infusion pump to be replaced by continuous chemo infusion.  Per Alyse Low, another nurse has already disconnected pump from patient, I just need to pick up pump.   I went up to pt's room and picked up CADD pump from pt.  Setting at this time, pt. received 112.70 ml with 81.3 ml left in reservoir volume.  Pt. has appt. to return to [facility] on July 2nd for 84fu infusion pump.  Will keep appt. Intact as not sure whether pt. will be discharge before then.

## 2013-07-01 DIAGNOSIS — B370 Candidal stomatitis: Secondary | ICD-10-CM | POA: Diagnosis present

## 2013-07-01 DIAGNOSIS — E43 Unspecified severe protein-calorie malnutrition: Secondary | ICD-10-CM

## 2013-07-01 LAB — CBC
HEMATOCRIT: 40.8 % (ref 39.0–52.0)
Hemoglobin: 14.3 g/dL (ref 13.0–17.0)
MCH: 29.6 pg (ref 26.0–34.0)
MCHC: 35 g/dL (ref 30.0–36.0)
MCV: 84.5 fL (ref 78.0–100.0)
Platelets: 148 10*3/uL — ABNORMAL LOW (ref 150–400)
RBC: 4.83 MIL/uL (ref 4.22–5.81)
RDW: 12.5 % (ref 11.5–15.5)
WBC: 4.8 10*3/uL (ref 4.0–10.5)

## 2013-07-01 LAB — BASIC METABOLIC PANEL
BUN: 15 mg/dL (ref 6–23)
CALCIUM: 8.1 mg/dL — AB (ref 8.4–10.5)
CO2: 25 mEq/L (ref 19–32)
Chloride: 98 mEq/L (ref 96–112)
Creatinine, Ser: 0.9 mg/dL (ref 0.50–1.35)
GFR calc Af Amer: >90 mL/min (ref >90)
Glucose, Bld: 102 mg/dL — ABNORMAL HIGH (ref 70–99)
Potassium: 3.8 mEq/L (ref 3.7–5.3)
Sodium: 133 mEq/L — ABNORMAL LOW (ref 137–147)

## 2013-07-01 LAB — GLUCOSE, CAPILLARY
GLUCOSE-CAPILLARY: 100 mg/dL — AB (ref 70–99)
GLUCOSE-CAPILLARY: 114 mg/dL — AB (ref 70–99)
Glucose-Capillary: 106 mg/dL — ABNORMAL HIGH (ref 70–99)

## 2013-07-01 MED ORDER — FLUCONAZOLE IN SODIUM CHLORIDE 200-0.9 MG/100ML-% IV SOLN
200.0000 mg | Freq: Once | INTRAVENOUS | Status: AC
  Administered 2013-07-01: 200 mg via INTRAVENOUS
  Filled 2013-07-01: qty 100

## 2013-07-01 MED ORDER — FLUOROURACIL CHEMO INJECTION 5 GM/100ML
200.0000 mg/m2/d | INTRAVENOUS | Status: AC
  Administered 2013-07-01: 300 mg via INTRAVENOUS
  Filled 2013-07-01: qty 6

## 2013-07-01 MED ORDER — FLUCONAZOLE 100MG IVPB
100.0000 mg | INTRAVENOUS | Status: DC
  Administered 2013-07-02 – 2013-07-05 (×4): 100 mg via INTRAVENOUS
  Filled 2013-07-01 (×4): qty 50

## 2013-07-01 MED ORDER — NYSTATIN 100000 UNIT/ML MT SUSP
5.0000 mL | Freq: Four times a day (QID) | OROMUCOSAL | Status: DC
  Administered 2013-07-01 – 2013-07-05 (×17): 500000 [IU] via ORAL
  Filled 2013-07-01 (×19): qty 5

## 2013-07-01 MED ORDER — UNJURY CHICKEN SOUP POWDER
2.0000 [oz_av] | Freq: Once | ORAL | Status: AC
  Administered 2013-07-01: 2 [oz_av] via ORAL
  Filled 2013-07-01: qty 27

## 2013-07-01 NOTE — Clinical Social Work Note (Signed)
[place] Work  Clinical Social Work familiar with patient from outpatient cancer center.  CSW discussed patient's current living situation/financial needs, assisted with applying for disability/medicaid, and provided brief emotional counseling.  CSW will continue to follow patient throughout cancer journey.   Polo Riley, MSW, LCSW, OSW-C Clinical Social Worker [facility] [phone]

## 2013-07-01 NOTE — Evaluation (Signed)
Physical Therapy Evaluation Patient Details Name: FREELAND PRACHT MRN: [id number] DOB: 1955-03-01 Today's Date: 07/01/2013   History of Present Illness  58 yo maLE ADMITTED 06/27/13 WITH n/v, ABDOMINAL PAIN. cURRENTLY RECEIVING CHEMO  FOR METASTATIC ESOPHOGEAL CANCER.  Clinical Impression  Pt reports R shoulder is painful, pt reports getting up with IV pole to walk to bathroom. Pt will  Benefit from PT to address problems listed in note below.    Follow Up Recommendations Home health PT;Supervision/Assistance - 24 hour    Equipment Recommendations  None recommended by PT    Recommendations for Other Services       Precautions / Restrictions Precautions Precaution Comments: CHEMO      Mobility  Bed Mobility Overal bed mobility: Independent             General bed mobility comments: EXTRA TIME TO MOVE TO SITTING.  Transfers Overall transfer level: Modified independent   Transfers: Sit to/from Stand           General transfer comment: FROM BED AND TOILET .  Ambulation/Gait Ambulation/Gait assistance: Supervision Ambulation Distance (Feet): 20 Feet (X2)   Gait Pattern/deviations: Step-through pattern Gait velocity: SLOW   General Gait Details: pt did walk without IV pole .  Stairs            Wheelchair Mobility    Modified Rankin (Stroke Patients Only)       Balance                                             Pertinent Vitals/Pain Reports pain in abdomen and R shoulder.    Home Living Family/patient expects to be discharged to:: Private residence Living Arrangements: Spouse/significant other Available Help at Discharge: Family Type of Home: House Home Access: Stairs to enter Entrance Stairs-Rails: None Entrance Stairs-Number of Steps: 1 X 2 Home Layout: One level Home Equipment: None      Prior Function Level of Independence: Independent               Hand Dominance        Extremity/Trunk Assessment    Upper Extremity Assessment: Generalized weakness           Lower Extremity Assessment: Generalized weakness         Communication   Communication: No difficulties  Cognition Arousal/Alertness: Awake/alert Behavior During Therapy: WFL for tasks assessed/performed Overall Cognitive Status: Within Functional Limits for tasks assessed                      General Comments      Exercises        Assessment/Plan    PT Assessment    PT Diagnosis     PT Problem List    PT Treatment Interventions     PT Goals (Current goals can be found in the Care Plan section) Acute Rehab PT Goals Patient Stated Goal: i may need some help at home, to go home PT Goal Formulation: With patient Time For Goal Achievement: 07/15/13 Potential to Achieve Goals: Good    Frequency     Barriers to discharge        Co-evaluation               End of Session   Activity Tolerance: Patient tolerated treatment well;Patient limited by fatigue Patient left: in chair;with call bell/phone within  reach Nurse Communication: Mobility status (IV leaking)         Time: 3335-4562 PT Time Calculation (min): 16 min   Charges:   PT Evaluation $Initial PT Evaluation Tier I: 1 Procedure PT Treatments $Gait Training: 8-22 mins   PT G Codes:          Claretha Cooper 07/01/2013, 9:27 AM Tresa Endo PT [phone]

## 2013-07-01 NOTE — Progress Notes (Signed)
NUTRITION FOLLOW UP  Intervention:   -Trial protein supplement Unjury chicken broth -Continue Boost Plus TID -Begin 24 hr Calorie Count -Continue Diflucan regimen -Will continue to monitor and consider TF as warranted  Nutrition Dx:   Inadequate oral intake related to nausea/burning sensations as evidenced by PO intake <75%, 11.1% body weight loss in < 3 months- ongoing   Goal:   Pt to meet >/= 90% of their estimated nutrition needs -not met   Monitor:   Total protein/energy intake, labs, weights, swallow profile    Assessment:   6/29: -Pt endorsed an unintentional wt loss of 20-30 lbs since cancer dx. Has experienced 15 lbs weight loss in past 2 months (11.1 body weight loss, severe for time frame)  -Has been trying to drink Ensure or Boost several times daily, and tolerating pureed and gerber baby foods. Was evaluated by RD on 06/07/2013 for additional puree diet nutrition therapy  -Has had miniimal intake since Friday June 6/26 d/t hiccups and burning sensations after fluids and foods. Has been able to tolerate one Ensure and some bites of jello/popsicles during admit  -Discussed additional high protein/kcal supplements/foods sources. Provided nutrition education handout from the Academy of [employer] Dietetics, "Increasing Calories and Protein". Will order Boost TID for pt during admit. Provided Boost coupons.  -Pt also willing to trial Ensure Pudding and Unjury Chocolate Mousse for additional options of nutrient replenishment. RD to follow up for tolerance and additional recommendations  6/30: -Pt disliked Ensure pudding as it caused burning sensation -Tolerating approximately 75% of Boost Plus, consuming two/day -Had not tried Unjury Chocolate Mousse-prepared and offered to patient -Disliked MagicCup as it was too cold and too thick for pt to swallow -Pt able to tolerate some soups, will trial Unjury Chicken broth and follow up for tolerance -Receiving Diflucan for thrush.  RN noted medications reach full efficacy at 24 hours -Pt concerned for nutritional status and continued minimal intake. Wanted to discuss possibility for TF. -Will perform Calorie Count and assess PO intake. RD to assess intake vs estimated needs, and monitor for improvement with appetite w/improvement in thrush. -Calorie count enveloped placed. RN and NT informed.  Height: Ht Readings from Last 1 Encounters:  06/29/13 _0  (1.626 m)    Weight Status:   Wt Readings from Last 1 Encounters:  06/29/13 119 lb 0.8 oz (54 kg)    Re-estimated needs:  Kcal: 1700-1900  Protein: 100-110 gram  Fluid: >/=1700 ml/daily  Skin: WDL  Diet Order: Criss Rosales   Intake/Output Summary (Last 24 hours) at 07/01/13 1448 Last data filed at 07/01/13 1200  Gross per 24 hour  Intake 3903.35 ml  Output   1950 ml  Net 1953.35 ml    Last BM: 6/29   Labs:   Recent Labs Lab 06/28/13 2349 06/29/13 0730 07/01/13 0450  NA 141 138 133*  K 4.5 4.7 3.8  CL 101 102 98  CO2 _1 BUN _2 CREATININE 0.98 0.94 0.90  CALCIUM 8.6 8.4 8.1*  GLUCOSE 161* 149* 102*    CBG (last 3)   Recent Labs  07/01/13 0032 07/01/13 0400 07/01/13 0743  GLUCAP 114* 106* 100*    Scheduled Meds: . baclofen  5 mg Oral BID  . enoxaparin (LOVENOX) injection  40 mg Subcutaneous Q24H  . [START ON 07/02/2013] fluconazole (DIFLUCAN) IV  100 mg Intravenous Q24H  . FLUOROURACIL (ADRUCIL) CHEMO infusion For Inpatient Use  200 mg/m2/day (Treatment Plan Actual) Intravenous Q24H  . lactose  free nutrition  237 mL Oral TID WC  . lidocaine-prilocaine   Topical Once  . montelukast  10 mg Oral Daily  . nicotine  21 mg Transdermal Daily  . nystatin  5 mL Oral QID  . pantoprazole (PROTONIX) IV  40 mg Intravenous Q12H  . protein supplement  2 oz Oral Once  . senna-docusate  1 tablet Oral BID  . sucralfate  1 g Oral TID WC & HS    Continuous Infusions: . sodium chloride 100 mL/hr at 07/01/13 [place] LDN Clinical Dietitian [phone]

## 2013-07-01 NOTE — Progress Notes (Signed)
OT Cancellation Note  Patient Details Name: [name] MRN: [id number] DOB: May 28, 1955   Cancelled Treatment:    Reason Eval/Treat Not Completed: Other (comment)  Pt is agreeable to OT.  Checked twice:  Once he had just gotten back to bed and now he is trying to eat.  Will return later today if schedule permits.  SPENCER,MARYELLEN 07/01/2013, 12:58 PM Lesle Chris, OTR/L [phone] 07/01/2013

## 2013-07-01 NOTE — Care Management Note (Addendum)
    Page 1 of 2   07/03/2013     3:12:19 PM CARE MANAGEMENT NOTE 07/03/2013  Patient:  Devin Gonzalez, Devin Gonzalez   Account Number:  [id number]  Date Initiated:  07/01/2013  Documentation initiated by:  [facility]  Subjective/Objective Assessment:   adm: Abdominal pain, heartburn, hiccups; hx esophageal CA s/p radiation Rx and started chemo 6/26     Action/Plan:   from home and plans to return home with [facility]   Anticipated DC Date:  07/03/2013   Anticipated DC Plan:  [place]  CM consult      [facility] Choice  HOME HEALTH   Choice offered to / List presented to:  C-1 Patient        [place] arranged  [place].   Status of service:  Completed, signed off Medicare Important Message given?  NO (If response is "NO", the following Medicare IM given date fields will be blank) Date Medicare IM given:   Medicare IM given by:   Date Additional Medicare IM given:   Additional Medicare IM given by:    Discharge Disposition:  [place]  Per UR Regulation:    If discussed at Long Length of Stay Meetings, dates discussed:    Comments:  07/03/13 15:00 CM notes [place] orders have been placed.  DME rep called to deliver rolling walker to room prior to discharge.  No other CM needs were communicated.  Mariane Masters, BSN, Jearld Lesch [phone].  07/01/13 12:30 CM met with patient to offer choice of home health agency.  Pt states he has never had home health in the past but [facility] is fine to render HHPT/OT.  Address and contact information verified with pt.  Pt states he has a cane at home and no DME recc by PT eval.  Referral called to [facility] rep, Katie.  Request for [facility] orders made to MD.  Will continue to monitor for [facility] orders.  Mariane Masters, BSN, CM [phone].

## 2013-07-01 NOTE — Progress Notes (Signed)
Devin Gonzalez   DOB:[birth year]   TI#:[id number]   ASN#:[id number]  Subjective: He notes some improvement since staring the anti-fungal therapy.  He still notes burning when swallowing.  He denies fevers or chills.  He is on 5-FU.    Objective:  Filed Vitals:   07/01/13 0404  BP: 140/84  Pulse: 96  Temp: 99.2 F (37.3 C)  Resp: 18    Body mass index is 20.42 kg/(m^2).  Intake/Output Summary (Last 24 hours) at 07/01/13 1134 Last data filed at 07/01/13 0405  Gross per 24 hour  Intake 4123.35 ml  Output   2060 ml  Net 2063.35 ml     Sclerae unicteric, thin male in mild distress  Oropharynx clear  No peripheral adenopathy  Lungs clear -- no rales or rhonchi  Heart regular rate and rhythm  Abdomen benign  MSK no focal spinal tenderness, no peripheral edema  Neuro nonfocal   CBG (last 3)   Recent Labs  07/01/13 0032 07/01/13 0400 07/01/13 0743  GLUCAP 114* 106* 100*     Labs:  Lab Results  Component Value Date   WBC 4.8 07/01/2013   HGB 14.3 07/01/2013   HCT 40.8 07/01/2013   MCV 84.5 07/01/2013   PLT 148* 07/01/2013   NEUTROABS 6.8 1/93/7902   Basic Metabolic Panel:  Recent Labs Lab 06/25/13 1039  06/28/13 2349 06/29/13 0730 07/01/13 0450  NA 143  --  141 138 133*  K 4.0  < > 4.5 4.7 3.8  CL  --   --  101 102 98  CO2 30*  --  29 25 25   GLUCOSE 156*  --  161* 149* 102*  BUN 9.0  --  22 23 15   CREATININE 1.1  --  0.98 0.94 0.90  CALCIUM 9.7  --  8.6 8.4 8.1*  < > = values in this interval not displayed. GFR Estimated Creatinine Clearance: 70 ml/min (by C-G formula based on Cr of 0.9). Liver Function Tests:  Recent Labs Lab 06/28/13 2349  AST 24  ALT 28  ALKPHOS 73  BILITOT 0.2*  PROT 6.6  ALBUMIN 3.1*    Recent Labs Lab 06/28/13 2349 06/30/13 0842  LIPASE 161* 24   No results found for this basename: AMMONIA,  in the last 168 hours Coagulation profile No results found for this basename: INR, PROTIME,  in the last 168  hours  CBC:  Recent Labs Lab 06/25/13 1039 06/28/13 2349 06/29/13 0730 07/01/13 0450  WBC 7.1 7.2 8.8 4.8  NEUTROABS 5.3 6.8  --   --   HGB 15.4 14.1 13.7 14.3  HCT 46.6 39.6 39.3 40.8  MCV 90.6 85.5 86.0 84.5  PLT 268 251 213 148*   Cardiac Enzymes: No results found for this basename: CKTOTAL, CKMB, CKMBINDEX, TROPONINI,  in the last 168 hours BNP: No components found with this basename: POCBNP,  CBG:  Recent Labs Lab 06/30/13 1640 06/30/13 2003 07/01/13 0032 07/01/13 0400 07/01/13 0743  GLUCAP 139* 121* 114* 106* 100*   D-Dimer No results found for this basename: DDIMER,  in the last 72 hours Hgb A1c  Recent Labs  06/29/13 0730  HGBA1C 6.1*   Studies:  No results found.  Assessment: 58 y.o. with the following:   1. Stage IV metastatic esophageal cancer. --  His counts are stable.Continue 5-FU  2. Thrush. --Continue fluconazole.   3. Uncontrolled hiccups. --Likely secondary lower esophageal spasm/ esophagitis s/p recent XRT.  Continue Maalox, protonix and sucralfate.   --Baclofen prn.  4. Uncontrolled pain. --Dialudid prn.  He may benefit fentanyl patch if not tolerating po meds.   5. DVT prophylaxis.  --Lovenox 40 sQ daily.   6. Nicotine dependency.   --Continue nicotine patch.    7. Poor nutrition secondary to #1. --Continue Boost (chocolate ) with meals. Albumin is 3.1.   8. Disposition. --Full Code.   Thanks for taking care of Mr. Kilker.    CHISM, DAVID, MD 07/01/2013  11:34 AM

## 2013-07-01 NOTE — Evaluation (Signed)
Occupational Therapy Evaluation Patient Details Name: Devin Gonzalez MRN: [id number] DOB: 1955/08/05 Today's Date: 07/01/2013    History of Present Illness 58 yo maLE ADMITTED 06/27/13 WITH n/v, ABDOMINAL PAIN. cURRENTLY RECEIVING CHEMO  FOR METASTATIC ESOPHOGEAL CANCER.   Clinical Impression   This 58 year old pt was admitted for the above.  He will benefit from skilled OT to continue to educate pt on energy conservation and increase strength/endurance for adls.  Pt is normally independent and goals are set for mod I.  Pt currently needs occasional min A.    Follow Up Recommendations  Home health OT (vs none, depending upon progress)    Equipment Recommendations   (to be further assessed)    Recommendations for Other Services       Precautions / Restrictions Precautions Precaution Comments: CHEMO Restrictions Weight Bearing Restrictions: No      Mobility Bed Mobility               General bed mobility comments: pt at eob  Transfers Overall transfer level: Needs assistance   Transfers: Sit to/from Stand Sit to Stand: Supervision         General transfer comment: held onto iv pole for stability    Balance                                            ADL Overall ADL's : Needs assistance/impaired     Grooming: Set up;Sitting   Upper Body Bathing: Set up;Sitting   Lower Body Bathing: Minimal assistance;Sit to/from stand   Upper Body Dressing : Set up;Sitting   Lower Body Dressing: Minimal assistance;Sit to/from stand                 General ADL Comments: pt stood with min guard:  c/o headache and had indigestion after lunch.  Having difficulty swallowing, even liquids per pt.  Decreased endurance.  Educated on energy conservation:  will need reinforcement     Vision                     Perception     Praxis      Pertinent Vitals/Pain C/o headache, not rated.  Repositioned.  Pt will call for pain medication if  this doesn't go away     Hand Dominance     Extremity/Trunk Assessment Upper Extremity Assessment Upper Extremity Assessment: RUE deficits/detail;Generalized weakness RUE Deficits / Details: R shoulder and scapula with pain:  able to lift arm actively to 90.  Educated to move actively and also placed pillow under shoulder to elbow for increased comfort           Communication Communication Communication: No difficulties   Cognition Arousal/Alertness: Awake/alert Behavior During Therapy: WFL for tasks assessed/performed Overall Cognitive Status: Within Functional Limits for tasks assessed                     General Comments       Exercises       Shoulder Instructions      Home Living Family/patient expects to be discharged to:: Private residence Living Arrangements: Spouse/significant other Available Help at Discharge: Family Type of Home: House Home Access: Stairs to enter Technical brewer of Steps: 1 X 2 Entrance Stairs-Rails: None Home Layout: One level     Bathroom Shower/Tub: Tub/shower unit Shower/tub characteristics: Architectural technologist: Standard  Home Equipment: None          Prior Functioning/Environment Level of Independence: Independent             OT Diagnosis: Generalized weakness   OT Problem List: Decreased strength;Decreased activity tolerance;Pain;Decreased knowledge of use of DME or AE   OT Treatment/Interventions: Self-care/ADL training;DME and/or AE instruction;Patient/family education;Energy conservation    OT Goals(Current goals can be found in the care plan section) Acute Rehab OT Goals Patient Stated Goal: i may need some help at home, to go home OT Goal Formulation: With patient Time For Goal Achievement: 07/15/13 Potential to Achieve Goals: Good ADL Goals Pt Will Perform Grooming: with modified independence;standing Pt Will Transfer to Toilet: with modified independence;regular height toilet;bedside  commode;ambulating Additional ADL Goal #1: pt will complete adl at mod I level Additional ADL Goal #2: pt verbalize/utilize energy conservation strategies during adls  OT Frequency: Min 2X/week   Barriers to D/C:            Co-evaluation              End of Session    Activity Tolerance: Patient limited by fatigue;Patient limited by pain Patient left: in bed;with call bell/phone within reach;with family/visitor present   Time: 9924-2683 OT Time Calculation (min): 17 min Charges:  OT General Charges $OT Visit: 1 Procedure OT Evaluation $Initial OT Evaluation Tier I: 1 Procedure OT Treatments $Therapeutic Activity: 8-22 mins G-Codes:    SPENCER,MARYELLEN Jul 03, 2013, 2:47 PM  Lesle Chris, OTR/L [phone] 07/03/13

## 2013-07-01 NOTE — Progress Notes (Signed)
[name] PROGRESS NOTE  Devin Gonzalez TMH:[id number] DOB: 1955-12-17 DOA: 06/28/2013 PCP: [name], MD  Assessment/Plan: Devin Gonzalez is a 58 y.o. male with a history of Metastatic Esophageal Cancer to mediastinal, lymph nodes and possibly to brain, S/P radiation Rx who started a continuous Chemotherapy infusion on 06/27/2013 who presents to the ED with complaints of epigastric ABD pain, increased reflux symptoms, heartburn and hiccups for the past 2 days. He also has had nausea and vomiting but no diarrhea. He was evaluated in the Ed and was found to have a Lipase level of 161 and he was referred for medical admission. He was started IV protonix, IV fluids, pain management. Dr. Juliann Mule with oncology has been helping with patient care. He continue to have odynophagia and epigastric pain. He has not improved on Carafate and IV protonix He was notice to have oral candidiasis on physical exam today. I will start fluconazole. Follow respond to treatment.     1-Stage IV metastatic esophageal cancer  to mediastinal, lymph nodes and possibly to brain. On chemotherapy, oncology following.   2-Epigastric pain; Hiccups, Odynophagia: Probably related to oral pharyngeal, esophageal candidiasis. Vs esophagitis from radiation treatments.  Mildly increase lipase at 161 on admission, LFT normal. Repeat  Lipase normal.  Continue with  IV fluids, Continue with  IV protonix, Carafate. On baclofen for Hiccup.  He has oral candidiasis. I will star fluconazole and nystatin swish swallow. Might need Treatment for esophageal candidiasis if symptoms improved with fluconazole.   3-Constipation;KUB negative for obstruction, constipation. Has had BM. Continue with laxatives.   4-Hyperglycemia- CBG below 120. Will discontinue CBG.  5. Tobacco Abuse- Nicotine Patch daily. Continue smoking cessation.  6. DVT Prophylaxis with Lovenox.     Code Status: Full Code.  Family Communication: Care discussed  with the patient.  Disposition Plan: Remain inpatient. Home when improved.    Consultants:  Oncologist.   Procedures:  none  Antibiotics: Others;   Fluconazole 6-30  HPI/Subjective: Patient still complaining of odynophagia, burning sensation throat, chest epigastric area. Not eating well. Was able to drink ensure.   Objective: Filed Vitals:   07/01/13 0404  BP: 140/84  Pulse: 96  Temp: 99.2 F (37.3 C)  Resp: 18    Intake/Output Summary (Last 24 hours) at 07/01/13 0900 Last data filed at 07/01/13 0405  Gross per 24 hour  Intake 4123.35 ml  Output   2410 ml  Net 1713.35 ml   Filed Weights   06/28/13 2207 06/29/13 0543  Weight: 50.803 kg (112 lb) 54 kg (119 lb 0.8 oz)    Exam:   General:  No distress. Thin appearing.   ENT: oral Trush, white plaques upper  Palate and tongue/    Cardiovascular: S 1, S 2 RRR  Respiratory: CTA  Abdomen: BS present, mild distended, mild tenderness.   Musculoskeletal: no edema.   Data Reviewed: Basic Metabolic Panel:  Recent Labs Lab 06/25/13 1039 06/28/13 2349 06/29/13 0730 07/01/13 0450  NA 143 141 138 133*  K 4.0 4.5 4.7 3.8  CL  --  101 102 98  CO2 30* 29 25 25   GLUCOSE 156* 161* 149* 102*  BUN 9.0 22 23 15   CREATININE 1.1 0.98 0.94 0.90  CALCIUM 9.7 8.6 8.4 8.1*   Liver Function Tests:  Recent Labs Lab 06/28/13 2349  AST 24  ALT 28  ALKPHOS 73  BILITOT 0.2*  PROT 6.6  ALBUMIN 3.1*    Recent Labs Lab 06/28/13 2349 06/30/13 0842  LIPASE  161* 24   No results found for this basename: AMMONIA,  in the last 168 hours CBC:  Recent Labs Lab 06/25/13 1039 06/28/13 2349 06/29/13 0730 07/01/13 0450  WBC 7.1 7.2 8.8 4.8  NEUTROABS 5.3 6.8  --   --   HGB 15.4 14.1 13.7 14.3  HCT 46.6 39.6 39.3 40.8  MCV 90.6 85.5 86.0 84.5  PLT 268 251 213 148*   Cardiac Enzymes: No results found for this basename: CKTOTAL, CKMB, CKMBINDEX, TROPONINI,  in the last 168 hours BNP (last 3 results) No  results found for this basename: PROBNP,  in the last 8760 hours CBG:  Recent Labs Lab 06/30/13 1640 06/30/13 2003 07/01/13 0032 07/01/13 0400 07/01/13 0743  GLUCAP 139* 121* 114* 106* 100*    No results found for this or any previous visit (from the past 240 hour(s)).   Studies: Dg Abd 1 View  06/29/2013   CLINICAL DATA:  Abdominal pain.  EXAM: ABDOMEN - 1 VIEW  COMPARISON:  None.  FINDINGS: Single supine view abdomen and pelvis. The high abdomen is partially excluded. Minimal motion degradation. Non-obstructive bowel gas pattern. Moderate amount of ascending and descending colonic stool. Distal gas and stool. No abnormal abdominal calcifications. No appendicolith.  IMPRESSION: No acute findings.  Possible constipation.  Decreased sensitivity and specificity exam due to technique related factors, as described above.   Electronically Signed   By: Abigail Miyamoto M.D.   On: 06/29/2013 12:27    Scheduled Meds: . baclofen  5 mg Oral BID  . enoxaparin (LOVENOX) injection  40 mg Subcutaneous Q24H  . FLUOROURACIL (ADRUCIL) CHEMO infusion For Inpatient Use  200 mg/m2/day (Treatment Plan Actual) Intravenous Q24H  . insulin aspart  0-9 Units Subcutaneous 6 times per day  . lactose free nutrition  237 mL Oral TID WC  . lidocaine-prilocaine   Topical Once  . montelukast  10 mg Oral Daily  . nicotine  21 mg Transdermal Daily  . pantoprazole (PROTONIX) IV  40 mg Intravenous Q12H  . senna-docusate  1 tablet Oral BID  . sucralfate  1 g Oral TID WC & HS   Continuous Infusions: . sodium chloride 100 mL/hr at 07/01/13 0449    Principal Problem:   Acute pancreatitis Active Problems:   Malignant neoplasm of lower third of esophagus   Epigastric abdominal pain   Hyperglycemia   Tobacco abuse   Protein-calorie malnutrition, severe    Time spent: 30 minutes.     Niel Hummer A  [name] Pager [phone]. If 7PM-7AM, please contact night-coverage at [email or web address], password  [facility] 07/01/2013, 9:00 AM  LOS: 3 days

## 2013-07-01 NOTE — Progress Notes (Signed)
CALORIE COUNT  Please hang calorie count envelope on the patient's door. Document percent consumed for each item on the patient's meal tray ticket and keep in envelope. Also document percent of any supplement or snack pt consumes and keep documentation in envelope for RD to review.    Atlee Abide MS RD LDN Clinical Dietitian [phone]

## 2013-07-02 ENCOUNTER — Ambulatory Visit: Payer: BC Managed Care – PPO

## 2013-07-02 DIAGNOSIS — R638 Other symptoms and signs concerning food and fluid intake: Secondary | ICD-10-CM | POA: Diagnosis present

## 2013-07-02 DIAGNOSIS — K2100 Gastro-esophageal reflux disease with esophagitis, without bleeding: Secondary | ICD-10-CM

## 2013-07-02 DIAGNOSIS — K219 Gastro-esophageal reflux disease without esophagitis: Secondary | ICD-10-CM | POA: Diagnosis present

## 2013-07-02 DIAGNOSIS — K209 Esophagitis, unspecified without bleeding: Secondary | ICD-10-CM | POA: Diagnosis present

## 2013-07-02 DIAGNOSIS — G893 Neoplasm related pain (acute) (chronic): Secondary | ICD-10-CM | POA: Diagnosis present

## 2013-07-02 LAB — CBC
HEMATOCRIT: 39.5 % (ref 39.0–52.0)
Hemoglobin: 14.1 g/dL (ref 13.0–17.0)
MCH: 29.7 pg (ref 26.0–34.0)
MCHC: 35.7 g/dL (ref 30.0–36.0)
MCV: 83.3 fL (ref 78.0–100.0)
Platelets: 149 10*3/uL — ABNORMAL LOW (ref 150–400)
RBC: 4.74 MIL/uL (ref 4.22–5.81)
RDW: 12.4 % (ref 11.5–15.5)
WBC: 5.8 10*3/uL (ref 4.0–10.5)

## 2013-07-02 LAB — BASIC METABOLIC PANEL
BUN: 16 mg/dL (ref 6–23)
CO2: 24 mEq/L (ref 19–32)
CREATININE: 0.84 mg/dL (ref 0.50–1.35)
Calcium: 8.2 mg/dL — ABNORMAL LOW (ref 8.4–10.5)
Chloride: 99 mEq/L (ref 96–112)
Glucose, Bld: 109 mg/dL — ABNORMAL HIGH (ref 70–99)
POTASSIUM: 3.8 meq/L (ref 3.7–5.3)
Sodium: 135 mEq/L — ABNORMAL LOW (ref 137–147)

## 2013-07-02 MED ORDER — FENTANYL 12 MCG/HR TD PT72
12.5000 ug | MEDICATED_PATCH | TRANSDERMAL | Status: DC
  Administered 2013-07-02: 12.5 ug via TRANSDERMAL
  Filled 2013-07-02: qty 1

## 2013-07-02 MED ORDER — DIPHENHYDRAMINE HCL 50 MG/ML IJ SOLN
25.0000 mg | Freq: Four times a day (QID) | INTRAMUSCULAR | Status: DC | PRN
  Administered 2013-07-02 – 2013-07-04 (×6): 25 mg via INTRAVENOUS
  Filled 2013-07-02 (×6): qty 1

## 2013-07-02 MED ORDER — UNJURY CHOCOLATE CLASSIC POWDER
3.0000 | Freq: Three times a day (TID) | ORAL | Status: DC
  Administered 2013-07-02 – 2013-07-04 (×4): 3 via ORAL
  Filled 2013-07-02 (×11): qty 81

## 2013-07-02 MED ORDER — UNJURY CHOCOLATE CLASSIC POWDER
2.0000 [oz_av] | Freq: Four times a day (QID) | ORAL | Status: DC
  Filled 2013-07-02 (×3): qty 27

## 2013-07-02 MED ORDER — UNJURY CHOCOLATE CLASSIC POWDER
2.0000 [oz_av] | Freq: Three times a day (TID) | ORAL | Status: DC
  Filled 2013-07-02 (×2): qty 27

## 2013-07-02 MED ORDER — FLUOROURACIL CHEMO INJECTION 5 GM/100ML
200.0000 mg/m2/d | INTRAVENOUS | Status: AC
  Administered 2013-07-02: 300 mg via INTRAVENOUS
  Filled 2013-07-02: qty 6

## 2013-07-02 MED ORDER — LIDOCAINE VISCOUS 2 % MT SOLN
15.0000 mL | OROMUCOSAL | Status: DC | PRN
Start: 2013-07-02 — End: 2013-07-05
  Administered 2013-07-05 (×2): 15 mL via OROMUCOSAL
  Filled 2013-07-02 (×2): qty 15

## 2013-07-02 NOTE — Progress Notes (Signed)
Progress Note   Devin Gonzalez:[id number] DOB: 1955/06/24 DOA: 06/28/2013 PCP: [name], MD   Brief Narrative:   Devin Gonzalez is an 58 y.o. male with a PMH of metastatic esophageal cancer status post radiation therapy and initiation of chemotherapy with epirubicin & Cisplatin x 1 and 5-FU on 06/27/13 by continuous infusion who was admitted 06/28/13 with epigastric abdominal pain, reflux symptoms and nausea/vomiting. Upon initial evaluation in the ED, his lipase was found to be 161.  Assessment/Plan:   Principal Problem:   Acute pancreatitis with epigastric abdominal pain  Treated conservatively with pain medication, bowel rest, and IV fluids.  Lipase normalized.  Active Problems:   Cancer related pain  Continue Dilaudid-HP as needed. Try a fentanyl patch for better pain relief.    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 Continue 5-FU.  Continue IV Protonix, Carafate, fluconazole, and nystatin. Add viscous lidocaine when necessary.    Hyperglycemia  CBGs 100-139.    Tobacco abuse  Continue nicotine patch.    Protein-calorie malnutrition, severe / inadequate oral intake  Being followed by dietitian. Continue nutritional supplements.    Oral pharyngeal candidiasis  Continue fluconazole.    DVT Prophylaxis  Continue Lovenox.  Code Status: Full. Family Communication: No family at the bedside. Disposition Plan: Home when stable.   IV Access:    Port-A-Cath  Procedures:    None.   Medical Consultants:    Dr. Concha Norway, Oncology.   Other Consultants:    Occupational therapy: Home health OT versus none dependent on progress.  Physical therapy: Home health PT was 24 hour supervision recommended.  Dietitian   Anti-Infectives:    Diflucan 07/02/13--->  Subjective:   Devin Gonzalez feels miserable.  He continues to have burning pain in his mouth and esophagus, nausea, poor by mouth intake secondary to the pain.  Objective:    Filed Vitals:   07/01/13 1359 07/01/13 2058 07/01/13 2210 07/02/13 0404  BP: 136/77 168/81 153/75 151/82  Pulse: 94 87  94  Temp: 99.2 F (37.3 C) 99.3 F (37.4 C)  98.8 F (37.1 C)  TempSrc: Oral Oral  Oral  Resp: 18 16  18   Height:      Weight:      SpO2: 96% 98%  99%    Intake/Output Summary (Last 24 hours) at 07/02/13 1058 Last data filed at 07/02/13 0600  Gross per 24 hour  Intake 2803.17 ml  Output   1200 ml  Net 1603.17 ml    Exam: Gen:  NAD Cardiovascular:  RRR, No M/R/G Respiratory:  Lungs CTAB Gastrointestinal:  Abdomen soft, NT/ND, + BS Extremities:  No C/E/C   Data Reviewed:    Labs: Basic Metabolic Panel:  Recent Labs Lab 06/28/13 2349 06/29/13 0730 07/01/13 0450 07/02/13 0430  NA 141 138 133* 135*  K 4.5 4.7 3.8 3.8  CL 101 102 98 99  CO2 29 25 25 24   GLUCOSE 161* 149* 102* 109*  BUN 22 23 15 16   CREATININE 0.98 0.94 0.90 0.84  CALCIUM 8.6 8.4 8.1* 8.2*   GFR Estimated Creatinine Clearance: 75 ml/min (by C-G formula based on Cr of 0.84). Liver Function Tests:  Recent Labs Lab 06/28/13 2349  AST 24  ALT 28  ALKPHOS 73  BILITOT 0.2*  PROT 6.6  ALBUMIN 3.1*    Recent Labs Lab 06/28/13 2349 06/30/13 0842  LIPASE  161* 24   CBC:  Recent Labs Lab 06/28/13 2349 06/29/13 0730 07/01/13 0450 07/02/13 0430  WBC 7.2 8.8 4.8 5.8  NEUTROABS 6.8  --   --   --   HGB 14.1 13.7 14.3 14.1  HCT 39.6 39.3 40.8 39.5  MCV 85.5 86.0 84.5 83.3  PLT 251 213 148* 149*   CBG:  Recent Labs Lab 06/30/13 1640 06/30/13 2003 07/01/13 0032 07/01/13 0400 07/01/13 0743  GLUCAP 139* 121* 114* 106* 100*   Sepsis Labs:  Recent Labs Lab 06/28/13 2349 06/29/13 0730 07/01/13 0450 07/02/13 0430  WBC 7.2 8.8 4.8 5.8   Microbiology No results found for this or any previous visit (from the past 240  hour(s)).   Radiographs/Studies:   Dg Abd 1 View  06/29/2013   CLINICAL DATA:  Abdominal pain.  EXAM: ABDOMEN - 1 VIEW  COMPARISON:  None.  FINDINGS: Single supine view abdomen and pelvis. The high abdomen is partially excluded. Minimal motion degradation. Non-obstructive bowel gas pattern. Moderate amount of ascending and descending colonic stool. Distal gas and stool. No abnormal abdominal calcifications. No appendicolith.  IMPRESSION: No acute findings.  Possible constipation.  Decreased sensitivity and specificity exam due to technique related factors, as described above.   Electronically Signed   By: Abigail Miyamoto M.D.   On: 06/29/2013 12:27    Medications:   . enoxaparin (LOVENOX) injection  40 mg Subcutaneous Q24H  . fluconazole (DIFLUCAN) IV  100 mg Intravenous Q24H  . FLUOROURACIL (ADRUCIL) CHEMO infusion For Inpatient Use  200 mg/m2/day (Treatment Plan Actual) Intravenous Q24H  . FLUOROURACIL (ADRUCIL) CHEMO infusion For Inpatient Use  200 mg/m2/day (Treatment Plan Actual) Intravenous Q24H  . lactose free nutrition  237 mL Oral TID WC  . lidocaine-prilocaine   Topical Once  . montelukast  10 mg Oral Daily  . nicotine  21 mg Transdermal Daily  . nystatin  5 mL Oral QID  . pantoprazole (PROTONIX) IV  40 mg Intravenous Q12H  . senna-docusate  1 tablet Oral BID  . sucralfate  1 g Oral TID WC & HS   Continuous Infusions: . sodium chloride 100 mL/hr at 07/02/13 0055    Time spent: 25 minutes.   LOS: 4 days   [facility] Pager [phone]. If unable to reach me by pager, please call my cell phone at [phone].  *Please refer to [email or web address], password [email or web address] to get updated schedule on who will round on this patient, as hospitalists switch teams weekly. If 7PM-7AM, please contact night-coverage at [email or web address], password [email or web address] for any overnight needs.  07/02/2013, 10:58 AM    **Disclaimer: This note was dictated with voice recognition software. Similar sounding  words can inadvertently be transcribed and this note may contain transcription errors which may not have been corrected upon publication of note.**

## 2013-07-02 NOTE — Progress Notes (Signed)
Calorie Count Note  48 hour calorie count ordered.  Diet: Soft diet Supplements: Boost TID, Unjury Chocolate Shake, Ensure pudding (trial) Estimated nutrition needs: Kcal: 1700-1900  Protein: 100-110 gram  Fluid: >/=1700 ml/daily   Breakfast: 0% Lunch: 174 kcal, 6 gram protein Dinner: 0% Supplements: 655 kcal, 44 gram protein (one Boost, one Unjury Chocolate shake w/2% milk, 1/2 Ensure pudding)  Total intake: 829 kcal (49% of minimum estimated needs)  50 protein (50% of minimum estimated needs)  Nutrition Dx: Inadequate oral intake related to nausea/burning sensations as evidenced by PO intake <75%, 11.1% body weight loss in < 3 months- ongoing  Goal: Pt to meet >/= 90% of their estimated nutrition needs -not met   Intervention:  -Continue Unjury Chocolate Shake TID -Continue Boost TID -Continue Calorie Count for additional 24 hours - assess PO intake and monitor improvement in thrush and esophagitis  Atlee Abide MS RD LDN Clinical Dietitian [phone]

## 2013-07-03 ENCOUNTER — Ambulatory Visit: Payer: BC Managed Care – PPO

## 2013-07-03 DIAGNOSIS — R5381 Other malaise: Secondary | ICD-10-CM | POA: Diagnosis present

## 2013-07-03 DIAGNOSIS — D701 Agranulocytosis secondary to cancer chemotherapy: Secondary | ICD-10-CM | POA: Diagnosis not present

## 2013-07-03 DIAGNOSIS — D72819 Decreased white blood cell count, unspecified: Secondary | ICD-10-CM

## 2013-07-03 DIAGNOSIS — D6959 Other secondary thrombocytopenia: Secondary | ICD-10-CM

## 2013-07-03 DIAGNOSIS — T451X5A Adverse effect of antineoplastic and immunosuppressive drugs, initial encounter: Secondary | ICD-10-CM

## 2013-07-03 DIAGNOSIS — R1310 Dysphagia, unspecified: Secondary | ICD-10-CM

## 2013-07-03 LAB — BASIC METABOLIC PANEL
ANION GAP: 10 (ref 5–15)
BUN: 10 mg/dL (ref 6–23)
CALCIUM: 8.1 mg/dL — AB (ref 8.4–10.5)
CO2: 25 mEq/L (ref 19–32)
CREATININE: 0.82 mg/dL (ref 0.50–1.35)
Chloride: 97 mEq/L (ref 96–112)
GFR calc non Af Amer: >90 mL/min (ref >90)
Glucose, Bld: 94 mg/dL (ref 70–99)
Potassium: 3.9 mEq/L (ref 3.7–5.3)
Sodium: 132 mEq/L — ABNORMAL LOW (ref 137–147)

## 2013-07-03 LAB — CBC
HCT: 36.6 % — ABNORMAL LOW (ref 39.0–52.0)
Hemoglobin: 13 g/dL (ref 13.0–17.0)
MCH: 29.7 pg (ref 26.0–34.0)
MCHC: 35.5 g/dL (ref 30.0–36.0)
MCV: 83.8 fL (ref 78.0–100.0)
Platelets: 128 10*3/uL — ABNORMAL LOW (ref 150–400)
RBC: 4.37 MIL/uL (ref 4.22–5.81)
RDW: 12.3 % (ref 11.5–15.5)
WBC: 2.4 10*3/uL — ABNORMAL LOW (ref 4.0–10.5)

## 2013-07-03 MED ORDER — FLUOROURACIL CHEMO INJECTION 5 GM/100ML
200.0000 mg/m2/d | INTRAVENOUS | Status: AC
  Administered 2013-07-03: 300 mg via INTRAVENOUS
  Filled 2013-07-03: qty 6

## 2013-07-03 MED ORDER — LIP MEDEX EX OINT
TOPICAL_OINTMENT | CUTANEOUS | Status: DC | PRN
  Administered 2013-07-04: 07:00:00 via TOPICAL
  Filled 2013-07-03: qty 7

## 2013-07-03 MED ORDER — FLUOROURACIL CHEMO INJECTION 5 GM/100ML: 200.0000 mg/m2/d | INTRAVENOUS | Status: DC

## 2013-07-03 NOTE — Progress Notes (Signed)
Progress Note   ZORAWAR STROLLO IWO:[id number] DOB: 01-05-1955 DOA: 06/28/2013 PCP: [name], MD   Brief Narrative:   Devin Gonzalez is an 58 y.o. male with a PMH of metastatic esophageal cancer status post radiation therapy and initiation of chemotherapy with epirubicin & Cisplatin x 1 and 5-FU on 06/27/13 by continuous infusion who was admitted 06/28/13 with epigastric abdominal pain, reflux symptoms and nausea/vomiting. Upon initial evaluation in the ED, his lipase was found to be 161.  Assessment/Plan:   Principal Problem:   Acute pancreatitis with epigastric abdominal pain  Treated conservatively with pain medication, bowel rest, and IV fluids.  Lipase normalized.  Tolerating soft diet.  Active Problems:   Chemotherapy induced leukopenia/thrombocytopenia  Monitor counts closely.    Cancer related pain  Continue fentanyl patch and Dilaudid-HP as needed.     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 Continue 5-FU, which should finish up in the next 24 hours.  Continue IV Protonix, Carafate, fluconazole, and nystatin. Encouraged use of viscous lidocaine when necessary.    Hyperglycemia  CBGs 100-139.    Tobacco abuse  Continue nicotine patch.    Protein-calorie malnutrition, severe / inadequate oral intake /  Physical deconditioning  Being followed by dietitian. Continue nutritional supplements.  Working with physical therapy to improve endurance/strength.    Oral pharyngeal candidiasis  Continue fluconazole. Thrush improved.    DVT Prophylaxis  Continue Lovenox.  Code Status: Full. Family Communication: No family at the bedside. Disposition Plan: Home when stable.   IV Access:    Port-A-Cath  Procedures:    None.   Medical Consultants:    Dr. Concha Norway, Oncology.   Other Consultants:     Occupational therapy: Home health OT versus none dependent on progress.  Physical therapy: Home health PT was 24 hour supervision recommended. Rolling walker with 5 inch heels recommended.  Dietitian   Anti-Infectives:    Diflucan 07/02/13--->  Subjective:   Devin Gonzalez feels slightly better. He was up ambulating with physical therapy. He still reports some burning-type abdominal pain and pain with swallowing. He's had a bit of nausea today and his appetite is still poor.  Objective:    Filed Vitals:   07/02/13 2049 07/03/13 0440 07/03/13 1004 07/03/13 1439  BP: 156/76 154/75 152/77 136/82  Pulse: 82 79 75 81  Temp: 98 F (36.7 C) 98.6 F (37 C) 98.6 F (37 C) 98.6 F (37 C)  TempSrc: Oral Oral Oral Oral  Resp: 16 18 18 17   Height:      Weight:      SpO2: 100% 98% 97% 99%    Intake/Output Summary (Last 24 hours) at 07/03/13 1446 Last data filed at 07/03/13 1411  Gross per 24 hour  Intake 2768.33 ml  Output   2100 ml  Net 668.33 ml    Exam: Gen:  NAD Oropharynx: Thick white coating on tongue improved when compared to yesterday. Cardiovascular:  RRR, No M/R/G Respiratory:  Lungs CTAB Gastrointestinal:  Abdomen soft, NT/ND, + BS Extremities:  No C/E/C   Data Reviewed:    Labs: Basic Metabolic Panel:  Recent Labs Lab 06/28/13 2349 06/29/13 0730 07/01/13 0450 07/02/13 0430 07/03/13 0433  NA 141 138 133* 135* 132*  K 4.5 4.7 3.8 3.8 3.9  CL 101 102 98 99 97  CO2 29 25 25 24 25   GLUCOSE 161* 149* 102*  109* 94  BUN 22 23 15 16 10   CREATININE 0.98 0.94 0.90 0.84 0.82  CALCIUM 8.6 8.4 8.1* 8.2* 8.1*   GFR Estimated Creatinine Clearance: 76.8 ml/min (by C-G formula based on Cr of 0.82). Liver Function Tests:  Recent Labs Lab 06/28/13 2349  AST 24  ALT 28  ALKPHOS 73  BILITOT 0.2*  PROT 6.6  ALBUMIN 3.1*    Recent Labs Lab 06/28/13 2349 06/30/13 0842  LIPASE 161* 24   CBC:  Recent Labs Lab 06/28/13 2349 06/29/13 0730  07/01/13 0450 07/02/13 0430 07/03/13 0433  WBC 7.2 8.8 4.8 5.8 2.4*  NEUTROABS 6.8  --   --   --   --   HGB 14.1 13.7 14.3 14.1 13.0  HCT 39.6 39.3 40.8 39.5 36.6*  MCV 85.5 86.0 84.5 83.3 83.8  PLT 251 213 148* 149* 128*   CBG:  Recent Labs Lab 06/30/13 1640 06/30/13 2003 07/01/13 0032 07/01/13 0400 07/01/13 0743  GLUCAP 139* 121* 114* 106* 100*   Sepsis Labs:  Recent Labs Lab 06/29/13 0730 07/01/13 0450 07/02/13 0430 07/03/13 0433  WBC 8.8 4.8 5.8 2.4*   Microbiology No results found for this or any previous visit (from the past 240 hour(s)).   Radiographs/Studies:   Dg Abd 1 View  06/29/2013   CLINICAL DATA:  Abdominal pain.  EXAM: ABDOMEN - 1 VIEW  COMPARISON:  None.  FINDINGS: Single supine view abdomen and pelvis. The high abdomen is partially excluded. Minimal motion degradation. Non-obstructive bowel gas pattern. Moderate amount of ascending and descending colonic stool. Distal gas and stool. No abnormal abdominal calcifications. No appendicolith.  IMPRESSION: No acute findings.  Possible constipation.  Decreased sensitivity and specificity exam due to technique related factors, as described above.   Electronically Signed   By: Abigail Miyamoto M.D.   On: 06/29/2013 12:27    Medications:   . enoxaparin (LOVENOX) injection  40 mg Subcutaneous Q24H  . fentaNYL  12.5 mcg Transdermal Q72H  . fluconazole (DIFLUCAN) IV  100 mg Intravenous Q24H  . FLUOROURACIL (ADRUCIL) CHEMO infusion For Inpatient Use  200 mg/m2/day (Treatment Plan Actual) Intravenous Q24H  . lactose free nutrition  237 mL Oral TID WC  . lidocaine-prilocaine   Topical Once  . montelukast  10 mg Oral Daily  . nicotine  21 mg Transdermal Daily  . nystatin  5 mL Oral QID  . pantoprazole (PROTONIX) IV  40 mg Intravenous Q12H  . protein supplement  3 packet Oral TID  . senna-docusate  1 tablet Oral BID  . sucralfate  1 g Oral TID WC & HS   Continuous Infusions: . sodium chloride 100 mL/hr at  07/03/13 0853    Time spent: 25 minutes.   LOS: 5 days   [facility] Pager [phone]. If unable to reach me by pager, please call my cell phone at [phone].  *Please refer to [email or web address], password [email or web address] to get updated schedule on who will round on this patient, as hospitalists switch teams weekly. If 7PM-7AM, please contact night-coverage at [email or web address], password [email or web address] for any overnight needs.  07/03/2013, 2:46 PM    **Disclaimer: This note was dictated with voice recognition software. Similar sounding words can inadvertently be transcribed and this note may contain transcription errors which may not have been corrected upon publication of note.**

## 2013-07-03 NOTE — Progress Notes (Signed)
[facility]  [facility] is providing the following services: RW  If patient discharges after hours, please call [phone].   Linward Headland 07/03/2013, 3:23 PM

## 2013-07-03 NOTE — Progress Notes (Signed)
Physical Therapy Treatment Patient Details Name: NAITIK HERMANN MRN: [id number] DOB: 1955-05-04 Today's Date: 07/03/2013    History of Present Illness 58 yo maLE ADMITTED 06/27/13 WITH n/v, ABDOMINAL PAIN. cURRENTLY RECEIVING CHEMO  FOR METASTATIC ESOPHOGEAL CANCER.    PT Comments    **Pt tolerated increased gait distance, he walked 240' with RW, no LOB. *  Follow Up Recommendations  Home health PT;Supervision for mobility/OOB     Equipment Recommendations  Rolling walker with 5" wheels    Recommendations for Other Services       Precautions / Restrictions Precautions Precaution Comments: CHEMO Restrictions Weight Bearing Restrictions: No    Mobility  Bed Mobility               General bed mobility comments: pt up in chair  Transfers Overall transfer level: Modified independent Equipment used: Rolling walker (2 wheeled) Transfers: Sit to/from Stand Sit to Stand: Modified independent (Device/Increase time)         General transfer comment: pushed up from armrests  Ambulation/Gait Ambulation/Gait assistance: Supervision Ambulation Distance (Feet): 240 Feet Assistive device: Rolling walker (2 wheeled)   Gait velocity: decreased Gait velocity interpretation: Below normal speed for age/gender General Gait Details: steady with RW   Stairs            Wheelchair Mobility    Modified Rankin (Stroke Patients Only)       Balance                                    Cognition Arousal/Alertness: Awake/alert Behavior During Therapy: WFL for tasks assessed/performed Overall Cognitive Status: Within Functional Limits for tasks assessed                      Exercises      General Comments        Pertinent Vitals/Pain *pt reports he only has pain with swallowing MD aware**    Home Living                      Prior Function            PT Goals (current goals can now be found in the care plan section)  Acute Rehab PT Goals Patient Stated Goal: to work in my garden PT Goal Formulation: With patient Time For Goal Achievement: 07/15/13 Potential to Achieve Goals: Good Progress towards PT goals: Progressing toward goals    Frequency       PT Plan Current plan remains appropriate    Co-evaluation             End of Session Equipment Utilized During Treatment: Gait belt Activity Tolerance: Patient tolerated treatment well Patient left: in chair;with call bell/phone within reach     Time: 1238-1249 PT Time Calculation (min): 11 min  Charges:  $Gait Training: 8-22 mins                    G Codes:      Philomena Doheny 07/03/2013, 12:54 PM [phone]

## 2013-07-03 NOTE — Progress Notes (Signed)
Devin Gonzalez   DOB:12-06-1955   WG#:[id number]   VHQ#:[id number]  Subjective: Patient expresses odynophagia.  He states that even pudding causes him pain. I spoke with Rene Kocher who reports seldom requests pain medicines.   Objective:  Filed Vitals:   07/03/13 1439  BP: 136/82  Pulse: 81  Temp: 98.6 F (37 C)  Resp: 17    Body mass index is 20.42 kg/(m^2).  Intake/Output Summary (Last 24 hours) at 07/03/13 1744 Last data filed at 07/03/13 1645  Gross per 24 hour  Intake 2648.33 ml  Output   2500 ml  Net 148.33 ml     Sclerae unicteric, thin male in mild distress  Oropharynx with thick layed mucous and whitish strips  Lungs clear -- no rales or rhonchi  Heart regular rate and rhythm  Abdomen benign  MSK no focal spinal tenderness, no peripheral edema  Neuro nonfocal   CBG (last 3)   Recent Labs  07/01/13 0032 07/01/13 0400 07/01/13 0743  GLUCAP 114* 106* 100*     Labs:  Lab Results  Component Value Date   WBC 2.4* 07/03/2013   HGB 13.0 07/03/2013   HCT 36.6* 07/03/2013   MCV 83.8 07/03/2013   PLT 128* 07/03/2013   NEUTROABS 6.8 06/28/2013   Basic Metabolic Panel:  Recent Labs Lab 06/28/13 2349 06/29/13 0730 07/01/13 0450 07/02/13 0430 07/03/13 0433  NA 141 138 133* 135* 132*  K 4.5 4.7 3.8 3.8 3.9  CL 101 102 98 99 97  CO2 29 25 25 24 25   GLUCOSE 161* 149* 102* 109* 94  BUN 22 23 15 16 10   CREATININE 0.98 0.94 0.90 0.84 0.82  CALCIUM 8.6 8.4 8.1* 8.2* 8.1*   GFR Estimated Creatinine Clearance: 76.8 ml/min (by C-G formula based on Cr of 0.82). Liver Function Tests:  Recent Labs Lab 06/28/13 2349  AST 24  ALT 28  ALKPHOS 73  BILITOT 0.2*  PROT 6.6  ALBUMIN 3.1*    Recent Labs Lab 06/28/13 2349 06/30/13 0842  LIPASE 161* 24   No results found for this basename: AMMONIA,  in the last 168 hours Coagulation profile No results found for this basename: INR, PROTIME,  in the last 168 hours  CBC:  Recent Labs Lab 06/28/13 2349 06/29/13 0730  07/01/13 0450 07/02/13 0430 07/03/13 0433  WBC 7.2 8.8 4.8 5.8 2.4*  NEUTROABS 6.8  --   --   --   --   HGB 14.1 13.7 14.3 14.1 13.0  HCT 39.6 39.3 40.8 39.5 36.6*  MCV 85.5 86.0 84.5 83.3 83.8  PLT 251 213 148* 149* 128*   Cardiac Enzymes: No results found for this basename: CKTOTAL, CKMB, CKMBINDEX, TROPONINI,  in the last 168 hours BNP: No components found with this basename: POCBNP,  CBG:  Recent Labs Lab 06/30/13 1640 06/30/13 2003 07/01/13 0032 07/01/13 0400 07/01/13 0743  GLUCAP 139* 121* 114* 106* 100*   D-Dimer No results found for this basename: DDIMER,  in the last 72 hours Hgb A1c No results found for this basename: HGBA1C,  in the last 72 hours Studies:  No results found.  Assessment: 58 y.o. with the following:   1. Stage IV metastatic esophageal cancer. --  His counts are decreasing. 5-FU was started at 2 pm today.  We will stop chemotherapy following completion of this bag.  NO FURTHER chemotherapy due to persistence in his odynophagia concerning for inflammation in the setting of his decreasing counts. We will see him on Thursday , 07/09 to  reconnect if his counts are adequate.    2. Thrush. --Continue fluconazole.   3. Uncontrolled hiccups. --Likely secondary lower esophageal spasm/ esophagitis s/p recent XRT.  Continue Maalox, protonix and sucralfate.   --Baclofen prn. If persists, can also try thorazine.   4. Uncontrolled pain. --Dialudid prn.  Encouraged to take medication prior to eating to help with esophageal pain. Continue fentanyl patch.   5. DVT prophylaxis.  --Lovenox 40 sQ daily.   6. Nicotine dependency.   --Continue nicotine patch.    7. Poor nutrition secondary to #1. --Continue Boost (chocolate ) with meals. Albumin is 3.1.   8. Disposition. --Full Code.   Thanks for taking care of Mr. Schwalbe.    Leda Bellefeuille, MD 07/03/2013  5:44 PM

## 2013-07-03 NOTE — Progress Notes (Signed)
Calorie Count Note  48 hour calorie count ordered.  Diet: Soft diet Supplements: Boost TID, Unjury Chocolate Shake,  Estimated nutrition needs: Kcal: 1700-1900  Protein: 100-110 gram  Fluid: >/=1700 ml/daily   Breakfast: 0% Lunch: 30 kcal, 0.5 gram protein Dinner: 0% Supplements: 220 kcal, 28 gram protein (one Unjury Chocolate shake w/2% milk)  Total intake: 250 kcal (15% of minimum estimated needs)  29 protein (29% of minimum estimated needs)  Patient was lethargic during time of RD follow up d/t pain medications. Has been trying to increase oral intake. Assisted in ordering dinner meal. MD has ordered Protonix, Carafate, fluconazole and nystatin to assist with esophagitis and candidiasis; noted some improvement w/pt's thrush. PO intake will likely return to baseline with continued improvment  Nutrition Dx: Inadequate oral intake related to nausea/burning sensations as evidenced by PO intake <75%, 11.1% body weight loss in < 3 months- ongoing  Goal: Pt to meet >/= 90% of their estimated nutrition needs -not met   Intervention:  -Continue Unjury Chocolate Shake TID -Continue Boost TID -Continue with Soft diet, assisted in meal ordering  Atlee Abide Sauk LDN Clinical Dietitian [phone]

## 2013-07-03 NOTE — Progress Notes (Signed)
OT Cancellation Note  Patient Details Name: AMILLION MACCHIA MRN: [id number] DOB: [birth year]   Cancelled Treatment:    Reason Eval/Treat Not Completed: Other (comment)  Pt plans to walk with PT at noon.  Prefers to rest now.  Will try to check back tomorrow.  [place], OTR/L [phone] 07/03/2013  Artrice Kraker 07/03/2013, 11:05 AM

## 2013-07-04 LAB — CBC
HCT: 34.4 % — ABNORMAL LOW (ref 39.0–52.0)
Hemoglobin: 12.3 g/dL — ABNORMAL LOW (ref 13.0–17.0)
MCH: 29.6 pg (ref 26.0–34.0)
MCHC: 35.8 g/dL (ref 30.0–36.0)
MCV: 82.7 fL (ref 78.0–100.0)
PLATELETS: 112 10*3/uL — AB (ref 150–400)
RBC: 4.16 MIL/uL — AB (ref 4.22–5.81)
RDW: 12.2 % (ref 11.5–15.5)
WBC: 1.6 10*3/uL — AB (ref 4.0–10.5)

## 2013-07-04 MED ORDER — DIPHENHYDRAMINE HCL 50 MG/ML IJ SOLN
25.0000 mg | INTRAMUSCULAR | Status: DC | PRN
Start: 2013-07-04 — End: 2013-07-05
  Administered 2013-07-04 – 2013-07-05 (×3): 25 mg via INTRAVENOUS
  Filled 2013-07-04 (×3): qty 1

## 2013-07-04 MED ORDER — FLUOROURACIL CHEMO INJECTION 5 GM/100ML
200.0000 mg/m2/d | INTRAVENOUS | Status: DC
  Filled 2013-07-04: qty 6

## 2013-07-04 NOTE — Progress Notes (Signed)
Fentanyl patch d/c'd per order,flushed,Kim S. RN witnessed.Sandie Ano RN

## 2013-07-04 NOTE — Progress Notes (Signed)
OT Cancellation Note  Patient Details Name: Devin Gonzalez MRN: [id number] DOB: July 03, 1955   Cancelled Treatment:    Reason Eval/Treat Not Completed: Other (comment) Attempted OT twice today.  Pt was in pain earlier and he is now working with PT.  Will likely check back Monday.  Buryl Bamber 07/04/2013, 1:15 PM Lesle Chris, OTR/L [phone] 07/04/2013

## 2013-07-04 NOTE — Progress Notes (Signed)
Physical Therapy Treatment Patient Details Name: Devin Gonzalez MRN: [id number] DOB: Jan 09, 1955 Today's Date: 07/04/2013    History of Present Illness 58 yo maLE ADMITTED 06/27/13 WITH n/v, ABDOMINAL PAIN. cURRENTLY RECEIVING CHEMO  FOR METASTATIC ESOPHOGEAL CANCER.    PT Comments    Progressing with mobility.   Follow Up Recommendations  Home health PT;Supervision for mobility/OOB     Equipment Recommendations  Rolling walker with 5" wheels    Recommendations for Other Services       Precautions / Restrictions Precautions Precaution Comments: CHEMO Restrictions Weight Bearing Restrictions: No    Mobility  Bed Mobility               General bed mobility comments: pt up in chair  Transfers Overall transfer level: Modified independent                  Ambulation/Gait Ambulation/Gait assistance: Supervision Ambulation Distance (Feet): 500 Feet Assistive device: Rolling walker (2 wheeled) Gait Pattern/deviations: Step-through pattern     General Gait Details: steady with RW   Stairs            Wheelchair Mobility    Modified Rankin (Stroke Patients Only)       Balance                                    Cognition Arousal/Alertness: Awake/alert Behavior During Therapy: WFL for tasks assessed/performed Overall Cognitive Status: Within Functional Limits for tasks assessed                      Exercises      General Comments        Pertinent Vitals/Pain No c/o pain    Home Living                      Prior Function            PT Goals (current goals can now be found in the care plan section) Progress towards PT goals: Progressing toward goals    Frequency  Min 3X/week    PT Plan Current plan remains appropriate    Co-evaluation             End of Session   Activity Tolerance: Patient tolerated treatment well Patient left: with call bell/phone within reach     Time:  0174-9449 PT Time Calculation (min): 18 min  Charges:  $Gait Training: 8-22 mins                    G Codes:      Weston Anna, MPT Pager: [phone]

## 2013-07-04 NOTE — Progress Notes (Signed)
Progress Note   Devin Gonzalez GXQ:[id number] DOB: [birth year] DOA: 06/28/2013 PCP: [name], MD   Brief Narrative:   Devin Gonzalez is an 58 y.o. male with a PMH of metastatic esophageal cancer status post radiation therapy and initiation of chemotherapy with epirubicin & Cisplatin x 1 and 5-FU on 06/27/13 by continuous infusion who was admitted 06/28/13 with epigastric abdominal pain, reflux symptoms and nausea/vomiting. Upon initial evaluation in the ED, his lipase was found to be 161. Chemotherapy discontinued 07/03/13 secondary to ongoing severe esophagitis.  Assessment/Plan:   Principal Problem:   Acute pancreatitis with epigastric abdominal pain  Treated conservatively with pain medication, bowel rest, and IV fluids.  Lipase normalized.  Tolerating diet, which is mostly consisting of liquids at this point.  Active Problems:   Chemotherapy induced leukopenia/thrombocytopenia  Monitor counts closely, which continue to drop, secondary to chemotherapy.  Watch for neutropenia and consider Neulasta if absolute neutrophil count drops lower than 1.    Cancer related pain  Continue fentanyl patch and Dilaudid-HP as needed.     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 5-FU will be discontinued after today's bag completed.  Continue IV Protonix, Carafate, fluconazole, and nystatin. Encouraged use of viscous lidocaine when necessary.    Hyperglycemia  CBGs 100-139.    Tobacco abuse  Continue nicotine patch.    Protein-calorie malnutrition, severe / inadequate oral intake /  Physical deconditioning  Being followed by dietitian. Continue nutritional supplements.  Working with physical therapy to improve endurance/strength.    Oral pharyngeal candidiasis  Continue fluconazole. Thrush improved.    DVT Prophylaxis  Continue  Lovenox.  Code Status: Full. Family Communication: No family at the bedside. Disposition Plan: Home when stable.   IV Access:    Port-A-Cath  Procedures:    None.   Medical Consultants:    Dr. Concha Norway, Oncology.   Other Consultants:    Occupational therapy: Home health OT versus none dependent on progress.  Physical therapy: Home health PT was 24 hour supervision recommended. Rolling walker with 5 inch heels recommended.  Dietitian   Anti-Infectives:    Diflucan 07/02/13--->  Subjective:   Devin Gonzalez continues to have significant esophageal pain and difficulty swallowing. He has not moved his bowels in the last couple of days but thinks he may be able to move them today. No vomiting.  Objective:    Filed Vitals:   07/03/13 1004 07/03/13 1439 07/03/13 2057 07/04/13 0532  BP: 152/77 136/82 144/68 157/86  Pulse: 75 81 77 79  Temp: 98.6 F (37 C) 98.6 F (37 C) 98.9 F (37.2 C) 98.3 F (36.8 C)  TempSrc: Oral Oral Oral Oral  Resp: 18 17 18 18   Height:      Weight:      SpO2: 97% 99% 100% 100%    Intake/Output Summary (Last 24 hours) at 07/04/13 0738 Last data filed at 07/04/13 0600  Gross per 24 hour  Intake 4565.3 ml  Output   1650 ml  Net 2915.3 ml    Exam: Gen:  NAD Oropharynx: Thrush resolved. Cardiovascular:  RRR, No M/R/G Respiratory:  Lungs CTAB Gastrointestinal:  Abdomen soft, NT/ND, + BS Extremities:  No C/E/C   Data Reviewed:    Labs: Basic Metabolic Panel:  Recent Labs Lab 06/28/13 2349 06/29/13 0730 07/01/13 0450 07/02/13 0430 07/03/13 0433  NA 141 138 133* 135* 132*  K 4.5 4.7 3.8 3.8 3.9  CL 101 102 98 99 97  CO2 29 25 25 24 25   GLUCOSE 161* 149* 102* 109* 94  BUN 22 23 15 16 10   CREATININE 0.98 0.94 0.90 0.84 0.82  CALCIUM 8.6 8.4 8.1* 8.2* 8.1*   GFR Estimated Creatinine Clearance: 76.8 ml/min (by C-G formula based on Cr of 0.82). Liver Function Tests:  Recent Labs Lab 06/28/13 2349  AST 24   ALT 28  ALKPHOS 73  BILITOT 0.2*  PROT 6.6  ALBUMIN 3.1*    Recent Labs Lab 06/28/13 2349 06/30/13 0842  LIPASE 161* 24   CBC:  Recent Labs Lab 06/28/13 2349 06/29/13 0730 07/01/13 0450 07/02/13 0430 07/03/13 0433 07/04/13 0415  WBC 7.2 8.8 4.8 5.8 2.4* 1.6*  NEUTROABS 6.8  --   --   --   --   --   HGB 14.1 13.7 14.3 14.1 13.0 12.3*  HCT 39.6 39.3 40.8 39.5 36.6* 34.4*  MCV 85.5 86.0 84.5 83.3 83.8 82.7  PLT 251 213 148* 149* 128* 112*   CBG:  Recent Labs Lab 06/30/13 1640 06/30/13 2003 07/01/13 0032 07/01/13 0400 07/01/13 0743  GLUCAP 139* 121* 114* 106* 100*   Sepsis Labs:  Recent Labs Lab 07/01/13 0450 07/02/13 0430 07/03/13 0433 07/04/13 0415  WBC 4.8 5.8 2.4* 1.6*   Microbiology No results found for this or any previous visit (from the past 240 hour(s)).   Radiographs/Studies:   Dg Abd 1 View  06/29/2013   CLINICAL DATA:  Abdominal pain.  EXAM: ABDOMEN - 1 VIEW  COMPARISON:  None.  FINDINGS: Single supine view abdomen and pelvis. The high abdomen is partially excluded. Minimal motion degradation. Non-obstructive bowel gas pattern. Moderate amount of ascending and descending colonic stool. Distal gas and stool. No abnormal abdominal calcifications. No appendicolith.  IMPRESSION: No acute findings.  Possible constipation.  Decreased sensitivity and specificity exam due to technique related factors, as described above.   Electronically Signed   By: Abigail Miyamoto M.D.   On: 06/29/2013 12:27    Medications:   . enoxaparin (LOVENOX) injection  40 mg Subcutaneous Q24H  . fentaNYL  12.5 mcg Transdermal Q72H  . fluconazole (DIFLUCAN) IV  100 mg Intravenous Q24H  . FLUOROURACIL (ADRUCIL) CHEMO infusion For Inpatient Use  200 mg/m2/day (Treatment Plan Actual) Intravenous Q24H  . FLUOROURACIL (ADRUCIL) CHEMO infusion For Inpatient Use  200 mg/m2/day (Treatment Plan Actual) Intravenous Q24H  . lactose free nutrition  237 mL Oral TID WC  .  lidocaine-prilocaine   Topical Once  . montelukast  10 mg Oral Daily  . nicotine  21 mg Transdermal Daily  . nystatin  5 mL Oral QID  . pantoprazole (PROTONIX) IV  40 mg Intravenous Q12H  . protein supplement  3 packet Oral TID  . senna-docusate  1 tablet Oral BID  . sucralfate  1 g Oral TID WC & HS   Continuous Infusions: . sodium chloride 100 mL/hr at 07/04/13 0646    Time spent: 25 minutes.   LOS: 6 days   [facility] Pager [phone]. If unable to reach me by pager, please call my cell phone at [phone].  *Please refer to [email or web address], password [email or web address] to get updated schedule on who will round on this patient, as hospitalists switch teams weekly. If 7PM-7AM, please contact night-coverage at [email or web address], password [email or web address] for any overnight needs.  07/04/2013, 7:38 AM    **Disclaimer: This note was dictated with voice recognition software. Similar sounding words  can inadvertently be transcribed and this note may contain transcription errors which may not have been corrected upon publication of note.**

## 2013-07-05 DIAGNOSIS — R638 Other symptoms and signs concerning food and fluid intake: Secondary | ICD-10-CM

## 2013-07-05 LAB — BASIC METABOLIC PANEL
Anion gap: 11 (ref 5–15)
BUN: 9 mg/dL (ref 6–23)
CHLORIDE: 97 meq/L (ref 96–112)
CO2: 24 meq/L (ref 19–32)
Calcium: 8.6 mg/dL (ref 8.4–10.5)
Creatinine, Ser: 0.89 mg/dL (ref 0.50–1.35)
GFR calc non Af Amer: >90 mL/min (ref >90)
Glucose, Bld: 88 mg/dL (ref 70–99)
Potassium: 4 mEq/L (ref 3.7–5.3)
Sodium: 132 mEq/L — ABNORMAL LOW (ref 137–147)

## 2013-07-05 LAB — CBC WITH DIFFERENTIAL/PLATELET
BASOS ABS: 0 10*3/uL (ref 0.0–0.1)
Basophils Relative: 1 % (ref 0–1)
Eosinophils Absolute: 0 10*3/uL (ref 0.0–0.7)
Eosinophils Relative: 1 % (ref 0–5)
HEMATOCRIT: 34.3 % — AB (ref 39.0–52.0)
Hemoglobin: 12.1 g/dL — ABNORMAL LOW (ref 13.0–17.0)
LYMPHS PCT: 17 % (ref 12–46)
Lymphs Abs: 0.3 10*3/uL — ABNORMAL LOW (ref 0.7–4.0)
MCH: 29.6 pg (ref 26.0–34.0)
MCHC: 35.3 g/dL (ref 30.0–36.0)
MCV: 83.9 fL (ref 78.0–100.0)
MONO ABS: 0.2 10*3/uL (ref 0.1–1.0)
Monocytes Relative: 14 % — ABNORMAL HIGH (ref 3–12)
NEUTROS PCT: 67 % (ref 43–77)
Neutro Abs: 1.1 10*3/uL — ABNORMAL LOW (ref 1.7–7.7)
Platelets: 95 10*3/uL — ABNORMAL LOW (ref 150–400)
RBC: 4.09 MIL/uL — ABNORMAL LOW (ref 4.22–5.81)
RDW: 12.2 % (ref 11.5–15.5)
WBC: 1.7 10*3/uL — AB (ref 4.0–10.5)

## 2013-07-05 MED ORDER — OXYCODONE-ACETAMINOPHEN 5-325 MG PO TABS: 1.0000 | ORAL_TABLET | ORAL | Status: DC | PRN

## 2013-07-05 MED ORDER — NYSTATIN 100000 UNIT/ML MT SUSP: 5.0000 mL | Freq: Four times a day (QID) | OROMUCOSAL | Status: DC

## 2013-07-05 MED ORDER — BOOST PLUS PO LIQD: 237.0000 mL | Freq: Three times a day (TID) | ORAL | Status: DC

## 2013-07-05 MED ORDER — HEPARIN SOD (PORK) LOCK FLUSH 100 UNIT/ML IV SOLN
500.0000 [IU] | INTRAVENOUS | Status: DC | PRN
  Filled 2013-07-05: qty 5

## 2013-07-05 MED ORDER — LIDOCAINE VISCOUS 2 % MT SOLN: 15.0000 mL | OROMUCOSAL | Status: DC | PRN

## 2013-07-05 MED ORDER — UNJURY CHOCOLATE CLASSIC POWDER: 3.0000 | Freq: Three times a day (TID) | ORAL | Status: DC

## 2013-07-05 MED ORDER — FLUCONAZOLE 10 MG/ML PO SUSR: 100.0000 mg | Freq: Every day | ORAL | Status: DC

## 2013-07-05 MED ORDER — PANTOPRAZOLE SODIUM 40 MG PO TBEC: 40.0000 mg | DELAYED_RELEASE_TABLET | Freq: Every day | ORAL | Status: DC

## 2013-07-05 NOTE — Progress Notes (Signed)
Patient was stable at time of discharge. Deaccessed port after saline and heparin flushing site. Reviewed discharge education with patient. He verbalized understanding. Gave patient prescription.

## 2013-07-05 NOTE — Discharge Instructions (Signed)
Hiccups Hiccups are caused by a sudden contraction of the muscles between the ribs and the muscle under your lungs (diaphragm). When you hiccup, the top of your windpipe (glottis) closes immediately after your diaphragm contracts. This makes the typical 'hic' sound. A hiccup is a reflex that you cannot stop. Unlike other reflexes such as coughing and sneezing, hiccups do not seem to have any useful purpose. There are 3 types of hiccups:   Benign bouts: last less than 48 hours.  Persistent: last more than 48 hours but less than 1 month.  Intractable: last more than 1 month. CAUSES  Most people have bouts of hiccups from time to time. They start for no apparent reason, last a short while, and then stop. Sometimes they are due to:  A temporary swollen stomach caused by overeating or eating too fast, eating spicy foods, drinking fizzy drinks, or swallowing air.  A sudden change in temperature (very hot or cold foods or drinks, a cold shower).  Drinking alcohol or using tobacco. There are no particular tests used to diagnose hiccups. Hiccups are usually considered harmless and do not point to a serious medical condition. However, there can be underlying medical problems that may cause hiccups, such as pneumonia, diabetes, metabolic problems, tumors, abdominal infections or injuries, and neurologic problems.You must follow up with your caregiver if your symptoms persist or become a frequent problem. TREATMENT   Most cases need no treatment. A bout of benign hiccups usually does not last long.  Medicine is sometimes needed to stop persistent hiccups. Medicine may be given intravenously (IV) or by mouth.  Hypnosis or acupuncture may be suggested.  Surgery to affect the nerve that supplies the diaphragm may be tried in severe cases.  Treatment of an underlying cause is needed in some cases. HOME CARE INSTRUCTIONS  Popular remedies that may stop a bout of hiccups include:  Gargling ice  water.  Swallowing granulated sugar.  Biting on a lemon.  Holding your breath, breathing fast, or breathing into a paper bag.  Bearing down.  Gasping after a sudden fright.  Pulling your tongue gently.  Distraction. SEEK MEDICAL CARE IF:   Hiccups last for more than 48 hours.  You are given medicine but your hiccups do not get better.  New symptoms show up.  You cannot sleep or eat due to the hiccups.  You have unexpected weight loss.  You have trouble breathing or swallowing.  You develop severe pain in your abdomen or other areas.  You develop numbness, tingling, or weakness. Document Released: 02/27/2001 Document Revised: 03/13/2011 Document Reviewed: 02/09/2010 [facility] Patient Information 2015 [place], [place]. This information is not intended to replace advice given to you by your health care provider. Make sure you discuss any questions you have with your health care provider.  Esophagitis Esophagitis is inflammation of the esophagus. It can involve swelling, soreness, and pain in the esophagus. This condition can make it difficult and painful to swallow. CAUSES  Most causes of esophagitis are not serious. Many different factors can cause esophagitis, including:  Gastroesophageal reflux disease (GERD). This is when acid from your stomach flows up into the esophagus.  Recurrent vomiting.  An allergic-type reaction.  Certain medicines, especially those that come in large pills.  Ingestion of harmful chemicals, such as household cleaning products.  Heavy alcohol use.  An infection of the esophagus.  Radiation treatment for cancer.  Certain diseases such as sarcoidosis, Crohn's disease, and scleroderma. These diseases may cause recurrent esophagitis. SYMPTOMS  Trouble swallowing.  Painful swallowing.  Chest pain.  Difficulty breathing.  Nausea.  Vomiting.  Abdominal pain. DIAGNOSIS  Your caregiver will take your history and do a physical  exam. Depending upon what your caregiver finds, certain tests may also be done, including:  Barium X-ray. You will drink a solution that coats the esophagus, and X-rays will be taken.  Endoscopy. A lighted tube is put down the esophagus so your caregiver can examine the area.  Allergy tests. These can sometimes be arranged through follow-up visits. TREATMENT  Treatment will depend on the cause of your esophagitis. In some cases, steroids or other medicines may be given to help relieve your symptoms or to treat the underlying cause of your condition. Medicines that may be recommended include:  Viscous lidocaine, to soothe the esophagus.  Antacids.  Acid reducers.  Proton pump inhibitors.  Antiviral medicines for certain viral infections of the esophagus.  Antifungal medicines for certain fungal infections of the esophagus.  Antibiotic medicines, depending on the cause of the esophagitis. HOME CARE INSTRUCTIONS   Avoid foods and drinks that seem to make your symptoms worse.  Eat small, frequent meals instead of large meals.  Avoid eating for the 3 hours prior to your bedtime.  If you have trouble taking pills, use a pill splitter to decrease the size and likelihood of the pill getting stuck or injuring the esophagus on the way down. Drinking water after taking a pill also helps.  Stop smoking if you smoke.  Maintain a healthy weight.  Wear loose-fitting clothing. Do not wear anything tight around your waist that causes pressure on your stomach.  Raise the head of your bed 6 to 8 inches with wood blocks to help you sleep. Extra pillows will not help.  Only take over-the-counter or prescription medicines as directed by your caregiver. SEEK IMMEDIATE MEDICAL CARE IF:  You have severe chest pain that radiates into your arm, neck, or jaw.  You feel sweaty, dizzy, or lightheaded.  You have shortness of breath.  You vomit blood.  You have difficulty or pain with  swallowing.  You have bloody or black, tarry stools.  You have a fever.  You have a burning sensation in the chest more than 3 times a week for more than 2 weeks.  You cannot swallow, drink, or eat.  You drool because you cannot swallow your saliva. MAKE SURE YOU:  Understand these instructions.  Will watch your condition.  Will get help right away if you are not doing well or get worse. Document Released: 01/27/2004 Document Revised: 03/13/2011 Document Reviewed: 08/19/2010 [facility] Patient Information 2015 [place], [place]. This information is not intended to replace advice given to you by your health care provider. Make sure you discuss any questions you have with your health care provider.  Esophageal Cancer Esophageal cancer occurs when abnormal cells within the esophagus begin to divide rapidly and uncontrollably. The esophagus is the tube that carries food and drink from the throat into the stomach.  CAUSES  The exact cause of esophageal cancer is not known. It is believed that esophageal cancer occurs due to many factors. People are at a higher risk of developing esophageal cancer if they:  Are older than 57 years of age.  Are male.  Smoke or use tobacco.  Drink heavily.  Eat a poor diet (low in fruits and vegetables).  Are overweight (obese).  Have conditions which result in long-standing damage or irritation to the esophagus. These conditions include:  Acid  reflux (the stomach acid leaks back up the esophagus, damaging it).  Barrett Esophagus (abnormal cells are found in the lower part of the esophagus, usually due to acid reflux occurring over a long period of time).  Achalasia (the muscles and nerves of the esophagus do not work properly. Food and drink do not move in a normal way down the esophagus and into the stomach).  Esophageal webs (thin strings of tissues grow within the esophagus).  Damage due to toxic exposures (an example would be swallowing a  caustic poison such as lye). SYMPTOMS  Symptoms can include:  Trouble swallowing.  Chest or back pain.  Unintentional weight loss.  Severe tiredness (fatigue).  Hoarse voice.  Cough. DIAGNOSIS  Esophageal cancer is usually diagnosed by performing:  Barium swallow. After drinking barium (a liquid that coats the esophagus), a series of X-rays are taken which can reveal abnormalities.  Endoscopic exam. A lighted scope is used to view the inside of the esophagus.  Biopsy. Small pieces of the esophagus are removed for exam in a lab. This can be done through the scope used for an endoscopic exam. TREATMENT  Treatment of esophageal cancer depends on a number of factors, including:   Characteristics of the cancer cells present.  Tumor size.  Whether the cancer has spread to lymph nodes or to other more distant locations (such as other organs or bone). The types of treatments used for esophageal cancer include:  Surgery to remove as much of the cancer as possible.  Chemotherapy (medicines that kill cancer cells).  Radiation therapy to kill cancer cells.  Combinations of chemotherapy and radiation therapy.  Biological therapy that uses antibodies in ways that can take advantage of the weaknesses of the tumor cells. Your caregivers will also address your needs for pain relief, nutrition, and help with swallowing problems if these develop.  HOME CARE INSTRUCTIONS   Only take over-the-counter or prescription medicines for pain, discomfort or fever as directed by your caregiver.  Maintain a healthy diet. Advice from a nutritionist can be helpful when addressing your specific needs.  Consider joining a support group. This may help you learn to cope with the stress of having esophageal cancer.  Seek advice to help you manage treatment side effects. SEEK MEDICAL CARE IF:  You develop problems with swallowing that are getting worse.  You notice new fatigue or weakness.  You  experience unintentional weight loss. SEEK IMMEDIATE MEDICAL CARE IF:  You have a sudden increase in pain.  You have trouble breathing.  You have a fever.  You vomit blood or black material that looks like coffee grounds.  You faint. Document Released: 12/02/2007 Document Revised: 03/13/2011 Document Reviewed: 12/02/2007 [facility] Patient Information 2015 [place], [place]. This information is not intended to replace advice given to you by your health care provider. Make sure you discuss any questions you have with your health care provider.

## 2013-07-05 NOTE — Discharge Summary (Signed)
Physician Discharge Summary  Devin Gonzalez QQV:[id number] DOB: 1955-01-31 DOA: 06/28/2013  PCP: Gaspar Garbe, MD  Admit date: 06/28/2013 Discharge date: 07/05/2013   Recommendations for Outpatient Follow-Up:   1. Home health PT, RN, aide set up.   Discharge Diagnosis:   Principal Problem:    Acute pancreatitis Active Problems:    Malignant neoplasm of lower third of esophagus    Epigastric abdominal pain    Hyperglycemia    Tobacco abuse    Protein-calorie malnutrition, severe    Oral pharyngeal candidiasis    Inadequate oral intake    Cancer related pain    Acute esophagitis    GERD (gastroesophageal reflux disease)    Physical deconditioning    Leukopenia due to antineoplastic chemotherapy    Chemotherapy induced thrombocytopenia   Discharge Condition: Improved.  Diet recommendation: Low sodium, heart healthy.     History of Present Illness:   Devin Gonzalez is an 58 y.o. male with a PMH of metastatic esophageal cancer status post radiation therapy and initiation of chemotherapy with epirubicin & Cisplatin x 1 and 5-FU on 06/27/13 by continuous infusion who was admitted 06/28/13 with epigastric abdominal pain, reflux symptoms and nausea/vomiting. Upon initial evaluation in the ED, his lipase was found to be 161. Chemotherapy discontinued 07/03/13 secondary to ongoing severe esophagitis.  Hospital Course by Problem:   Principal Problem:  Acute pancreatitis with epigastric abdominal pain  Treated conservatively with pain medication, bowel rest, and IV fluids.  Lipase normalized.  Tolerating diet, which is mostly consisting of liquids at this point. Discharge home with instructions to use boost supplements and a protein powder for improved nutritional intake. Active Problems:  Chemotherapy induced leukopenia/thrombocytopenia  Counts relatively stable, recommend close followup. Neutrophil count 1.1 upon discharge. Cancer related pain  Treated  with IV Dilaudid-HP. Roxanol liquid causes severe esophageal pain so we'll send home with a prescription for oxycodone tablets which he can crush and swallow in ice cream or applesauce.  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 5-FU discontinued 07/04/13.  Treated IV Protonix, Carafate, fluconazole, viscous lidocaine and nystatin.  Discharge home on oral Protonix, fluconazole, viscous lidocaine and nystatin. Hyperglycemia  Mild, possibly related to intermittent dexamethasone use. Tobacco abuse  Continue nicotine patch, wean. Protein-calorie malnutrition, severe / inadequate oral intake / Physical deconditioning  Followed by dietitian. Continue nutritional supplements.  Worked with physical therapy to improve endurance/strength, and home health physical therapy set up. Oral pharyngeal candidiasis  Continue fluconazole and nystatin. Thrush improved.  Procedures:    None.   Medical Consultants:    Dr. Myra Rude, Oncology.   Discharge Exam:   Filed Vitals:   07/05/13 1009  BP: 150/76  Pulse: 73  Temp: 98.5 F (36.9 C)  Resp: 16   Filed Vitals:   07/04/13 2115 07/05/13 0153 07/05/13 0444 07/05/13 1009  BP: 145/72 157/69 174/78 150/76  Pulse:  77 80 73  Temp:  98.8 F (37.1 C) 98.4 F (36.9 C) 98.5 F (36.9 C)  TempSrc:  Oral Oral Oral  Resp:  16 16 16   Height:      Weight:      SpO2:  94% 98% 96%    Gen:  NAD Oropharynx: No evidence of ongoing thrush. Cardiovascular:  RRR, No M/R/G Respiratory: Lungs CTAB Gastrointestinal: Abdomen soft, NT/ND with normal active bowel sounds. Extremities: No C/E/C    Discharge Instructions:  Discharge Instructions   Call MD for:  extreme fatigue    Complete by:  As directed      Call MD for:  persistant nausea and vomiting    Complete by:  As directed      Call MD for:  severe uncontrolled pain     Complete by:  As directed      Call MD for:  temperature >100.4    Complete by:  As directed      Diet - low sodium heart healthy    Complete by:  As directed      Discharge instructions    Complete by:  As directed   You were cared for by Dr. Hillery Aldo  (a hospitalist) during your hospital stay. If you have any questions about your discharge medications or the care you received while you were in the hospital after you are discharged, you can call the unit and ask to speak with the hospitalist on call if the hospitalist that took care of you is not available. Once you are discharged, your primary care physician will handle any further medical issues. Please note that NO REFILLS for any discharge medications will be authorized once you are discharged, as it is imperative that you return to your primary care physician (or establish a relationship with a primary care physician if you do not have one) for your aftercare needs so that they can reassess your need for medications and monitor your lab values.  Any outstanding tests can be reviewed by your PCP at your follow up visit.  It is also important to review any medicine changes with your PCP.  Please bring these d/c instructions with you to your next visit so your physician can review these changes with you.     Face-to-face encounter (required for Medicare/Medicaid patients)    Complete by:  As directed   I Devin Gonzalez certify that this patient is under my care and that I, or a nurse practitioner or physician's assistant working with me, had a face-to-face encounter that meets the physician face-to-face encounter requirements with this patient on 07/05/2013. The encounter with the patient was in whole, or in part for the following medical condition(s) which is the primary reason for home health care (List medical condition): The patient has physical deconditioning and severe malnutrition secondary to inadequate oral intake from odynophagia secondary  to esophagitis & thrush in the setting of recent chemo for esophageal cancer.  High risk for re-hospitalization.  RN for skilled assessment, PT for physical conditioning, and and aide to assist with ADLs.  The encounter with the patient was in whole, or in part, for the following medical condition, which is the primary reason for home health care:  Esophageal cancer, physical deconditioning secondary to malnutrition s/p chemo/ odynophagia  I certify that, based on my findings, the following services are medically necessary home health services:   Nursing Physical therapy    My clinical findings support the need for the above services:  Unable to leave home safely without assistance and/or assistive device  Further, I certify that my clinical findings support that this patient is homebound due to:  Unable to leave home safely without assistance  Reason for Medically Necessary Home Health Services:   Skilled Nursing- Skilled Assessment/Observation Therapy- Home Adaptation to Facilitate Safety Therapy- Therapeutic Exercises to Increase Strength and Endurance       Home Health    Complete by:  As directed   To provide the following care/treatments:  PT RN Home Health Aide       Increase activity slowly    Complete by:  As directed      Walker     Complete by:  As directed             Medication List    STOP taking these medications       oxyCODONE 5 MG/5ML solution  Commonly known as:  ROXICODONE      TAKE these medications       ALKA SELTZER PLUS PO  Take 2 capsules by mouth as needed (For reflux).     BENADRYL ALLERGY PO  Take 25 mg by mouth at bedtime.     chlorproMAZINE 10 MG tablet  Commonly known as:  THORAZINE  Take 1 tablet (10 mg total) by mouth 4 (four) times daily as needed for hiccoughs.     dexamethasone 4 MG tablet  Commonly known as:  DECADRON  Take 2 tablets by mouth once a day on the day after chemotherapy and then take 2 tablets two times a day for 2  days. Take with food.     fluconazole 10 MG/ML suspension  Commonly known as:  DIFLUCAN  Take 10 mLs (100 mg total) by mouth daily.     lactose free nutrition Liqd  Take 237 mLs by mouth 3 (three) times daily with meals.     lidocaine 2 % solution  Commonly known as:  XYLOCAINE  Use as directed 15 mLs in the mouth or throat every 3 (three) hours as needed for mouth pain.     lidocaine-prilocaine cream  Commonly known as:  EMLA  Apply to port site one hour before infusion and cover with plastic wrap     LORazepam 0.5 MG tablet  Commonly known as:  ATIVAN  Take 1 tablet (0.5 mg total) by mouth every 6 (six) hours as needed (Nausea or vomiting).     montelukast 10 MG tablet  Commonly known as:  SINGULAIR  Take 10 mg by mouth daily.     nicotine 14 mg/24hr patch  Commonly known as:  NICODERM CQ  Place 1 patch (14 mg total) onto the skin daily. apply 21 mg patch daily x6wk, then 14 mg patch daily x2wk, then 7 mg patch daily x2wk     nicotine 7 mg/24hr patch  Commonly known as:  NICODERM CQ  Place 1 patch (7 mg total) onto the skin daily. apply 21 mg patch daily x6wk, then 14 mg patch daily x2wk, then 7 mg patch daily x2wk     nystatin 100000 UNIT/ML suspension  Commonly known as:  MYCOSTATIN  Take 5 mLs (500,000 Units total) by mouth 4 (four) times daily.     ondansetron 8 MG tablet  Commonly known as:  ZOFRAN  Take 1 tablet (8 mg total) by mouth 2 (two) times daily as needed. Start on the third day after chemotherapy.     oxyCODONE-acetaminophen 5-325 MG per tablet  Commonly known as:  ROXICET  Take 1 tablet by mouth every 4 (four) hours as needed for severe pain.     pantoprazole 40 MG tablet  Commonly known as:  PROTONIX  Take 1 tablet (40 mg total) by mouth daily.     prochlorperazine 10 MG tablet  Commonly known as:  COMPAZINE  Take 1 tablet (10 mg total) by mouth every 6 (six) hours as needed (Nausea or vomiting).     protein supplement Powd  Commonly known as:   [employer]  CHOCOLATE CLASSIC  Take 81 g (3 packets total) by mouth 3 (three) times daily.     ranitidine 150 MG tablet  Commonly known as:  ZANTAC  Take 1 tablet (150 mg total) by mouth 2 (two) times daily.     sucralfate 1 GM/10ML suspension  Commonly known as:  CARAFATE  Take 10 mLs (1 g total) by mouth 4 (four) times daily -  with meals and at bedtime. (Use before meals)     VISINE OP  Place 2-4 drops into both eyes 2 (two) times daily as needed. For dry eyes           Follow-up Information   Follow up with Gaspar Garbe, MD. (Follow up with your primary care provider for ongoing healthcare needs and recheck of symptoms as needed.)    Specialty:  Internal Medicine   Contact information:   [address] [place] [address] [phone]       Follow up with CHISM, DAVID, MD. (At your appt times noted below.)    Specialty:  Internal Medicine   Contact information:   [address] [place] [address] [phone]        The results of significant diagnostics from this hospitalization (including imaging, microbiology, ancillary and laboratory) are listed below for reference.     Significant Diagnostic Studies:   Radiographs: Dg Abd 1 View  06/29/2013   CLINICAL DATA:  Abdominal pain.  EXAM: ABDOMEN - 1 VIEW  COMPARISON:  None.  FINDINGS: Single supine view abdomen and pelvis. The high abdomen is partially excluded. Minimal motion degradation. Non-obstructive bowel gas pattern. Moderate amount of ascending and descending colonic stool. Distal gas and stool. No abnormal abdominal calcifications. No appendicolith.  IMPRESSION: No acute findings.  Possible constipation.  Decreased sensitivity and specificity exam due to technique related factors, as described above.   Electronically Signed   By: Jeronimo Greaves M.D.   On: 06/29/2013 12:27   Labs:  Basic Metabolic Panel:  Recent Labs Lab 06/29/13 0730 07/01/13 0450 07/02/13 0430 07/03/13 0433 07/05/13 0436  NA 138  133* 135* 132* 132*  K 4.7 3.8 3.8 3.9 4.0  CL 102 98 99 97 97  CO2 25 25 24 25 24   GLUCOSE 149* 102* 109* 94 88  BUN 23 15 16 10 9   CREATININE 0.94 0.90 0.84 0.82 0.89  CALCIUM 8.4 8.1* 8.2* 8.1* 8.6   GFR Estimated Creatinine Clearance: 70.8 ml/min (by C-G formula based on Cr of 0.89). Liver Function Tests:  Recent Labs Lab 06/28/13 2349  AST 24  ALT 28  ALKPHOS 73  BILITOT 0.2*  PROT 6.6  ALBUMIN 3.1*    Recent Labs Lab 06/28/13 2349 06/30/13 0842  LIPASE 161* 24   CBC:  Recent Labs Lab 06/28/13 2349  07/01/13 0450 07/02/13 0430 07/03/13 0433 07/04/13 0415 07/05/13 0436  WBC 7.2  < > 4.8 5.8 2.4* 1.6* 1.7*  NEUTROABS 6.8  --   --   --   --   --  1.1*  HGB 14.1  < > 14.3 14.1 13.0 12.3* 12.1*  HCT 39.6  < > 40.8 39.5 36.6* 34.4* 34.3*  MCV 85.5  < > 84.5 83.3 83.8 82.7 83.9  PLT 251  < > 148* 149* 128* 112* 95*  < > = values in this interval not displayed. CBG:  Recent Labs Lab 06/30/13 1640 06/30/13 2003 07/01/13 0032 07/01/13 0400 07/01/13 0743  GLUCAP 139* 121* 114* 106* 100*    Time coordinating discharge:  35 minutes.  Signed:  Chelci [place]  Pager [phone] [name] 07/05/2013, 11:00 AM

## 2013-07-06 NOTE — Progress Notes (Signed)
07/06/2013 1245 Notified of pt's dc home with HH. Jonnie Finner RN CCM Case Mgmt phone [phone]

## 2013-07-07 ENCOUNTER — Other Ambulatory Visit: Payer: Self-pay | Admitting: Internal Medicine

## 2013-07-07 ENCOUNTER — Telehealth: Payer: Self-pay

## 2013-07-07 ENCOUNTER — Telehealth: Payer: Self-pay | Admitting: Internal Medicine

## 2013-07-07 DIAGNOSIS — C155 Malignant neoplasm of lower third of esophagus: Secondary | ICD-10-CM

## 2013-07-07 NOTE — Telephone Encounter (Signed)
added lb/fu to 7/9 tx. s/w pt he is aware of new time.

## 2013-07-07 NOTE — Telephone Encounter (Signed)
Pt states that his eye sight is worse, he has trouble watching TV. He says he has echos and noises in ears, and cannot hear people as well. He is losing his equilibrium and feeling wobbly. He is using a walker. S/w Dr Juliann Mule and called pt back. Told pt we could see him today and see if he needs fluids. Pt stated he had no ride and felt he could not drive himself. He will talk with family when they get home from work and can possibly come in tomorrow. He says he is urinating good quantities about every 2 hours and it is not dark or concentrated. Told pt we will plan to keep original appt on Thursday 7/9 unless we hear from him. To call us for swelling, or decreased urine output, fever, or worsening symptoms. Dr Juliann Mule made aware.

## 2013-07-08 ENCOUNTER — Encounter: Payer: Self-pay | Admitting: Nurse Practitioner

## 2013-07-08 ENCOUNTER — Encounter: Payer: Self-pay | Admitting: Radiation Oncology

## 2013-07-08 ENCOUNTER — Other Ambulatory Visit: Payer: Self-pay

## 2013-07-08 ENCOUNTER — Telehealth: Payer: Self-pay | Admitting: Internal Medicine

## 2013-07-08 ENCOUNTER — Ambulatory Visit (HOSPITAL_BASED_OUTPATIENT_CLINIC_OR_DEPARTMENT_OTHER): Payer: BC Managed Care – PPO | Admitting: Nurse Practitioner

## 2013-07-08 ENCOUNTER — Other Ambulatory Visit: Payer: Self-pay | Admitting: Nurse Practitioner

## 2013-07-08 VITALS — BP 167/77 | HR 69 | Temp 98.0°F | Resp 18 | Ht 64.0 in | Wt 107.9 lb

## 2013-07-08 DIAGNOSIS — H9193 Unspecified hearing loss, bilateral: Secondary | ICD-10-CM

## 2013-07-08 DIAGNOSIS — R5381 Other malaise: Secondary | ICD-10-CM

## 2013-07-08 DIAGNOSIS — R630 Anorexia: Secondary | ICD-10-CM

## 2013-07-08 DIAGNOSIS — K2100 Gastro-esophageal reflux disease with esophagitis, without bleeding: Secondary | ICD-10-CM

## 2013-07-08 DIAGNOSIS — C7952 Secondary malignant neoplasm of bone marrow: Secondary | ICD-10-CM

## 2013-07-08 DIAGNOSIS — C155 Malignant neoplasm of lower third of esophagus: Secondary | ICD-10-CM

## 2013-07-08 DIAGNOSIS — H9190 Unspecified hearing loss, unspecified ear: Secondary | ICD-10-CM

## 2013-07-08 DIAGNOSIS — C7951 Secondary malignant neoplasm of bone: Secondary | ICD-10-CM

## 2013-07-08 DIAGNOSIS — R42 Dizziness and giddiness: Secondary | ICD-10-CM

## 2013-07-08 DIAGNOSIS — Z720 Tobacco use: Secondary | ICD-10-CM

## 2013-07-08 DIAGNOSIS — C7949 Secondary malignant neoplasm of other parts of nervous system: Secondary | ICD-10-CM

## 2013-07-08 DIAGNOSIS — K1231 Oral mucositis (ulcerative) due to antineoplastic therapy: Secondary | ICD-10-CM

## 2013-07-08 DIAGNOSIS — K219 Gastro-esophageal reflux disease without esophagitis: Secondary | ICD-10-CM

## 2013-07-08 DIAGNOSIS — R5383 Other fatigue: Secondary | ICD-10-CM

## 2013-07-08 DIAGNOSIS — B370 Candidal stomatitis: Secondary | ICD-10-CM

## 2013-07-08 DIAGNOSIS — G893 Neoplasm related pain (acute) (chronic): Secondary | ICD-10-CM

## 2013-07-08 DIAGNOSIS — R066 Hiccough: Secondary | ICD-10-CM

## 2013-07-08 DIAGNOSIS — F17200 Nicotine dependence, unspecified, uncomplicated: Secondary | ICD-10-CM

## 2013-07-08 DIAGNOSIS — C7931 Secondary malignant neoplasm of brain: Secondary | ICD-10-CM

## 2013-07-08 MED ORDER — CHLORPROMAZINE HCL 25 MG PO TABS
ORAL_TABLET | ORAL | Status: AC
  Filled 2013-07-08: qty 1

## 2013-07-08 MED ORDER — CHLORPROMAZINE HCL 25 MG PO TABS
10.0000 mg | ORAL_TABLET | Freq: Once | ORAL | Status: AC
  Administered 2013-07-08: 12.5 mg via ORAL
  Filled 2013-07-08: qty 1

## 2013-07-08 NOTE — Progress Notes (Signed)
Per email from [place], requested to fax forms to [facility] - rec'd confirmation at 13:06pm - faxed [phone] pages [phone]

## 2013-07-08 NOTE — Telephone Encounter (Signed)
Gave pt appt calendar for July 2015, lab,ML, chemo

## 2013-07-08 NOTE — Assessment & Plan Note (Addendum)
Hearing loss does appear to be acute onset; and may very well be related to Cisplatin. Patient had pre-chemo audiology eval; but will order new audiology follow up eval ASAP.

## 2013-07-08 NOTE — Assessment & Plan Note (Signed)
Patient received only 1 cycle of chemotherapy on 06/27/2013; and has expereinced multiple side effects since that time. Patient's next chemo scheduled for Thursday 07/10/2013.  Will meet with Dr. Juliann Mule prior to discuss chemo plan and his recent issues.

## 2013-07-08 NOTE — Assessment & Plan Note (Signed)
Secondary to chemo adn radiation therapy.  Will also order PT referral for deconditioning.

## 2013-07-08 NOTE — Assessment & Plan Note (Signed)
Patient jsut recently stopped smoking; and has not yet filled his nicotene patch prescription.

## 2013-07-08 NOTE — Assessment & Plan Note (Signed)
Secondary to chemotherapy-induced mucositis.  Patient encouraged to eat multiple small meals throughout day.

## 2013-07-08 NOTE — Progress Notes (Signed)
Chief Complaint  Patient presents with  . Hiccups    HISTORY: Devin Gonzalez 58 y.o. male diagnosed with metastatic esophageal cancer status post radiation therapy and initiation of chemotherapy with epirubicin and cisplatin x 1 as well as 5-FU chemotherapy on 06/27/2013 by continuous infusion.  Patient was admitted 06/28/2013 with epigastric abdominal pain, reflux symptoms, and nausea/vomiting.  Initial evaluation in the emergency department his lipase was found to be 161.  Chemotherapy was discontinued 07/03/2013 secondary to these symptoms.  Patient was discharged on 07/05/2013; but continues to experience irretractable hiccups on an intermittent basis; as well as chronic GERD symptoms. Patient states that his mucositis symptoms have improved; but he is still having difficulty with oral intake due to mild oral pain and anorexia. Patient is also c/o bilateral hearing loss; and difficulty with high-pitched noises.  This loss of hearing has made him feel dizzy on occasion. Patient continues in his effort to stop smoking.   Patient brought in his recent discharge instructions and his discharge med list- but states that he has not obtained any of his new prescriptions. He also states that he feels adequately hydrated at this time.    CURRENT THERAPY: Upcoming Treatment Dates - GASTRIC ECF q21d Days with orders from any treatment category:  07/05/2013      fluorouracil (ADRUCIL) 300 mg in dextrose 5 % 1,000 mL chemo infusion 07/06/2013      fluorouracil (ADRUCIL) 300 mg in dextrose 5 % 1,000 mL chemo infusion 07/10/2013      SCHEDULING COMMUNICATION      fluorouracil (ADRUCIL) 2,200 mg in sodium chloride 0.9 % 150 mL chemo infusion      sodium chloride 0.9 % injection 10 mL      heparin lock flush 100 unit/mL      heparin lock flush 100 unit/mL      alteplase (CATHFLO ACTIVASE) injection 2 mg      sodium chloride 0.9 % injection 3 mL      Cold Pack 1 packet      0.9 %  sodium chloride  infusion   Past Medical History  Diagnosis Date  . Allergy   . Hemorrhoids   . Back pain     right side  . Bunion, right foot   . Left tennis elbow   . Osteoarthritis     hands, knees, right hip  . Prostatitis   . Diverticulosis 2013  . Adenomatous colon polyp   . ED (erectile dysfunction)   . Tobacco abuse   . Reflux esophagitis   . Nonspecific elevation of levels of transaminase or lactic acid dehydrogenase (LDH)     has Internal hemorrhoids with complication; Personal history of colonic polyps; Post-op pain; Anal pain; Malignant neoplasm of lower third of esophagus; Bone metastases; Brain metastasis; Visual changes; Pancreatitis, acute; Acute pancreatitis; Epigastric abdominal pain; Hyperglycemia; Tobacco abuse; Protein-calorie malnutrition, severe; Oral pharyngeal candidiasis; Inadequate oral intake; Cancer related pain; Acute esophagitis; GERD (gastroesophageal reflux disease); Physical deconditioning; Leukopenia due to antineoplastic chemotherapy; Chemotherapy induced thrombocytopenia; Intractable hiccups; Anorexia; Hearing loss; and Dizziness on his problem list.     has No Known Allergies.    Medication List       This list is accurate as of: 07/08/13  5:46 PM.  Always use your most recent med list.               ALKA SELTZER PLUS PO  Take 2 capsules by mouth as needed (For reflux).     BENADRYL  ALLERGY PO  Take 25 mg by mouth at bedtime.     chlorproMAZINE 10 MG tablet  Commonly known as:  THORAZINE  Take 1 tablet (10 mg total) by mouth 4 (four) times daily as needed for hiccoughs.     dexamethasone 4 MG tablet  Commonly known as:  DECADRON  Take 2 tablets by mouth once a day on the day after chemotherapy and then take 2 tablets two times a day for 2 days. Take with food.     fluconazole 10 MG/ML suspension  Commonly known as:  DIFLUCAN  Take 10 mLs (100 mg total) by mouth daily.     lactose free nutrition Liqd  Take 237 mLs by mouth 3 (three) times daily  with meals.     lidocaine 2 % solution  Commonly known as:  XYLOCAINE  Use as directed 15 mLs in the mouth or throat every 3 (three) hours as needed for mouth pain.     lidocaine-prilocaine cream  Commonly known as:  EMLA  Apply to port site one hour before infusion and cover with plastic wrap     LORazepam 0.5 MG tablet  Commonly known as:  ATIVAN  Take 1 tablet (0.5 mg total) by mouth every 6 (six) hours as needed (Nausea or vomiting).     montelukast 10 MG tablet  Commonly known as:  SINGULAIR  Take 10 mg by mouth daily.     nicotine 14 mg/24hr patch  Commonly known as:  NICODERM CQ  Place 1 patch (14 mg total) onto the skin daily. apply 21 mg patch daily x6wk, then 14 mg patch daily x2wk, then 7 mg patch daily x2wk     nicotine 7 mg/24hr patch  Commonly known as:  NICODERM CQ  Place 1 patch (7 mg total) onto the skin daily. apply 21 mg patch daily x6wk, then 14 mg patch daily x2wk, then 7 mg patch daily x2wk     nystatin 100000 UNIT/ML suspension  Commonly known as:  MYCOSTATIN  Take 5 mLs (500,000 Units total) by mouth 4 (four) times daily.     ondansetron 8 MG tablet  Commonly known as:  ZOFRAN  Take 1 tablet (8 mg total) by mouth 2 (two) times daily as needed. Start on the third day after chemotherapy.     oxyCODONE-acetaminophen 5-325 MG per tablet  Commonly known as:  ROXICET  Take 1 tablet by mouth every 4 (four) hours as needed for severe pain.     pantoprazole 40 MG tablet  Commonly known as:  PROTONIX  Take 1 tablet (40 mg total) by mouth daily.     prochlorperazine 10 MG tablet  Commonly known as:  COMPAZINE  Take 1 tablet (10 mg total) by mouth every 6 (six) hours as needed (Nausea or vomiting).     protein supplement Powd  Commonly known as:  UNJURY CHOCOLATE CLASSIC  Take 81 g (3 packets total) by mouth 3 (three) times daily.     ranitidine 150 MG tablet  Commonly known as:  ZANTAC  Take 1 tablet (150 mg total) by mouth 2 (two) times daily.      sucralfate 1 GM/10ML suspension  Commonly known as:  CARAFATE  Take 10 mLs (1 g total) by mouth 4 (four) times daily -  with meals and at bedtime. (Use before meals)     VISINE OP  Place 2-4 drops into both eyes 2 (two) times daily as needed. For dry eyes  Past Surgical History  Procedure Laterality Date  . Cyst on testicle Right   . Appendectomy    . Tonsillectomy    . Knee surgery Bilateral     arthroscopic surg /  bil knees  . Fatty tumor Right     removal forearm  . Colonoscopy w/ polypectomy    . Sphincterotomy  08/28/2011    Procedure: SPHINCTEROTOMY;  Surgeon: Joyice Faster. Cornett, MD;  Location: [facility];  Service: General;  Laterality: N/A;  Lateral internal sphincterotomy  . Hemorrhoid surgery  08/28/2011    Procedure: HEMORRHOIDECTOMY;  Surgeon: Joyice Faster. Cornett, MD;  Location: [facility];  Service: General;  Laterality: N/A;  ,possible hemorrhoidectomy  . Examination under anesthesia  12/13/2011    Procedure: EXAM UNDER ANESTHESIA;  Surgeon: Marcello Moores A. Cornett, MD;  Location: [facility];  Service: General;  Laterality: N/A;  . Anal fissure repair  2013    Cornett    Patient denies any headaches, dizziness, double vision, fevers, chills, night sweats, nausea, vomiting, diarrhea, constipation, chest pain, heart palpitations, shortness of breath, blood in stool, black tarry stool, urinary pain, urinary burning, urinary frequency, hematuria.    PHYSICAL EXAMINATION  Filed Vitals:   07/08/13 1414  BP: 167/77  Pulse: 69  Temp: 98 F (36.7 C)  Resp: 18    GENERAL:alert, no distress, cachectic and frail SKIN: skin color, texture, turgor are normal, no rashes or significant lesions HEAD: Normocephalic EYES: PERRLA, Conjunctiva are pink and non-injected EARS: External ears normal, Canals clear, TM's Normal, appears with normal hearing; but patient states that hearing minimal at times.  OROPHARYNX:mild erythema and no obvious lesions or coating to tongue noted.   NECK: supple,  no adenopathy, no bruits, no JVD, no stridor, non-tender LYMPH:  no palpable lymphadenopathy  LUNGS: negative findings:  normal respiratory rate and rhythm, no chest wall tenderness, lungs clear to auscultation HEART: regular rate & rhythm, no murmurs and no gallops ABDOMEN:abdomen soft, non-tender, normal bowel sounds and no masses or organomegaly BACK: No CVA tenderness, Range of motion is normal EXTREMITIES:no edema, no clubbing, no cyanosis, negative findings:  NEURO: alert & oriented x 3 with fluent speech, gait normal  LABORATORY DATA:None     .  ASSESSMENT/PLAN:     Malignant neoplasm of lower third of esophagus  Intractable hiccups - Plan: chlorproMAZINE (THORAZINE) tablet 12.5 mg  Anorexia:Secondary to chemotherapy-induced mucositis.  Patient encouraged to eat multiple small meals throughout day.   Hearing loss, bilateral:Hearing loss does appear to be acute onset; and may very well be related to Cisplatin. Patient had pre-chemo audiology eval; but will order new audiology follow up eval ASAP.    Dizziness: Most likely this is due to new onset hearing loss; as well as anorexia-induced weakness. Patient is now using either a cane or a walker on intermittent basis.    Bone metastases:Secondary to primary cancer.  Patient has oxycodone script that he has yet to fill from his discharge  Brain metastasis:New onset hearing loss and dizziness may be secondary to brain mets; but most likely related to previous cisplatin. Brain MRI 06/19/2013 revealed single 3 mm lesion to right frontal lobe concerning for mets.    Tobacco abuse:Patient just recently stopped smoking; and has not yet filled his nicotine patch prescription.   Oral pharyngeal candidiasis;Patient has oral diflucan prescription to fill as well; and he was encouraged to fill ASAP.  Also advised him to start using baking soda/salt swish and spit regularly.  Patient has oxycodone script with him  to fill ASAP for any  mucositis pain issues.  Cancer related pain :  Gastroesophageal reflux disease with esophagitis: Patient was given prescriptions for carafate and protonix; but has not filled either yet. Encouraged patient to fill both meds ASAP and take as directed.   Physical deconditioning:Secondary to chemo and radiation therapy.  Will also order PT referral for deconditioning.   Patient stated understanding of all instructions; and was in agreement with this plan of care. The patient knows to call the clinic with any problems, questions or concerns.   Total time spent with patient was 60 minutes;  with greater than 75 percent of time spent in face to face counseling reviewing all of patient's prescribed meds, oral care, nutrition, and follow up. Drue Second, NP 07/08/2013

## 2013-07-08 NOTE — Assessment & Plan Note (Signed)
Most likely this is due to new onset hearing loss; as well as anorexia-induced weakness. Patient is now using either a cane or a walker on intermittent basis.

## 2013-07-08 NOTE — Assessment & Plan Note (Signed)
New onset hearing loss and dizziness may be secondary to brain mets; but most likely related to previous cisplatin. Brain MRI 06/19/2013 revealed single 3 mm lesin to right frontal lobe conerning for mets.

## 2013-07-08 NOTE — Assessment & Plan Note (Signed)
Patient was given prescriptions for carafate and protonix; but has not filled either yet. Encouraged patient to fill both meds ASAP and take as directed.

## 2013-07-08 NOTE — Assessment & Plan Note (Signed)
Secondary to primary cancer.  Patient has oxycodone script that he has yet to fill from his discharge.

## 2013-07-08 NOTE — Assessment & Plan Note (Signed)
Patient has oxycodone script with him to fill ASAP for any mucositis pain issues.

## 2013-07-08 NOTE — Assessment & Plan Note (Signed)
Patient given thorazine prescription prior to discharge form hospital tis past weekend; but he has not filled the prescription yet. Patient given thorazine 12.5 mg PO while in clinic due to obvious hiccups during exam. Also encouraged pt to fill previous thorazine script as directed on discharge.

## 2013-07-08 NOTE — Assessment & Plan Note (Signed)
Patinet has oral diflucan prescription to fill as well; and he was encouraged to fill ASAP.  Also advised him to start using baking soda/salt swish and spit regularly.

## 2013-07-09 ENCOUNTER — Encounter: Payer: Self-pay | Admitting: Radiation Oncology

## 2013-07-09 ENCOUNTER — Ambulatory Visit: Payer: BC Managed Care – PPO

## 2013-07-09 NOTE — Progress Notes (Signed)
7.8.15 3:45pm:  rec'd phone call from Herman @ [facility] concerning patient's paperwork.  They did receive the paperwork the 1st time; however, the member page is not complete.  Ruthie said she has left messages and sent a letter to the patient and he has not ret'd phone calls.  7.8.15 4:15pm:  lvm for patient to contact Ruthie at [phone] or myself if he needs a copy of the paperwork that he needs to complete.

## 2013-07-10 ENCOUNTER — Ambulatory Visit: Payer: BC Managed Care – PPO

## 2013-07-10 ENCOUNTER — Other Ambulatory Visit (HOSPITAL_BASED_OUTPATIENT_CLINIC_OR_DEPARTMENT_OTHER): Payer: BC Managed Care – PPO

## 2013-07-10 ENCOUNTER — Ambulatory Visit (HOSPITAL_BASED_OUTPATIENT_CLINIC_OR_DEPARTMENT_OTHER): Payer: BC Managed Care – PPO | Admitting: Physician Assistant

## 2013-07-10 ENCOUNTER — Encounter: Payer: Self-pay | Admitting: Physician Assistant

## 2013-07-10 VITALS — BP 129/68 | HR 85 | Temp 97.9°F | Resp 18 | Ht 64.0 in | Wt 106.1 lb

## 2013-07-10 DIAGNOSIS — C155 Malignant neoplasm of lower third of esophagus: Secondary | ICD-10-CM | POA: Diagnosis not present

## 2013-07-10 DIAGNOSIS — H9190 Unspecified hearing loss, unspecified ear: Secondary | ICD-10-CM

## 2013-07-10 LAB — CBC WITH DIFFERENTIAL/PLATELET
BASO%: 0.7 % (ref 0.0–2.0)
BASOS ABS: 0 10*3/uL (ref 0.0–0.1)
EOS ABS: 0 10*3/uL (ref 0.0–0.5)
EOS%: 0.6 % (ref 0.0–7.0)
HEMATOCRIT: 42.3 % (ref 38.4–49.9)
HEMOGLOBIN: 13.8 g/dL (ref 13.0–17.1)
LYMPH%: 13.5 % — AB (ref 14.0–49.0)
MCH: 29.2 pg (ref 27.2–33.4)
MCHC: 32.7 g/dL (ref 32.0–36.0)
MCV: 89.2 fL (ref 79.3–98.0)
MONO#: 1 10*3/uL — AB (ref 0.1–0.9)
MONO%: 31.4 % — AB (ref 0.0–14.0)
NEUT%: 53.8 % (ref 39.0–75.0)
NEUTROS ABS: 1.7 10*3/uL (ref 1.5–6.5)
PLATELETS: 353 10*3/uL (ref 140–400)
RBC: 4.74 10*6/uL (ref 4.20–5.82)
RDW: 13 % (ref 11.0–14.6)
WBC: 3.1 10*3/uL — AB (ref 4.0–10.3)
lymph#: 0.4 10*3/uL — ABNORMAL LOW (ref 0.9–3.3)

## 2013-07-10 LAB — COMPREHENSIVE METABOLIC PANEL (CC13)
ALK PHOS: 57 U/L (ref 40–150)
ALT: 25 U/L (ref 0–55)
ANION GAP: 9 meq/L (ref 3–11)
AST: 20 U/L (ref 5–34)
Albumin: 3.1 g/dL — ABNORMAL LOW (ref 3.5–5.0)
BILIRUBIN TOTAL: 0.37 mg/dL (ref 0.20–1.20)
BUN: 9.8 mg/dL (ref 7.0–26.0)
CO2: 26 meq/L (ref 22–29)
CREATININE: 0.9 mg/dL (ref 0.7–1.3)
Calcium: 9.3 mg/dL (ref 8.4–10.4)
Chloride: 104 mEq/L (ref 98–109)
GLUCOSE: 116 mg/dL (ref 70–140)
Potassium: 4 mEq/L (ref 3.5–5.1)
Sodium: 139 mEq/L (ref 136–145)
TOTAL PROTEIN: 6.5 g/dL (ref 6.4–8.3)

## 2013-07-10 LAB — LACTATE DEHYDROGENASE (CC13): LDH: 153 U/L (ref 125–245)

## 2013-07-10 NOTE — Progress Notes (Signed)
[facility] OFFICE PROGRESS NOTE  Devin Pao, MD [place] [address]  DIAGNOSIS: Malignant neoplasm of lower third of esophagus - Plan: CBC with Differential, Comprehensive metabolic panel (Cmet) - [facility], Lactate dehydrogenase (LDH) - [facility]  Decreased hearing, unspecified laterality - Plan: Ambulatory referral to ENT  No chief complaint on file.   CURRENT TREATMENT:   Planning to start (ECF) consisting of  Epirubicin 50 mg/m2 IV, Cisplatin 60 mg/m2 IV on day #1, Fluorouracil 200 mg/m2 per day IV continuous infusion over 24 hours daily on days 1-21 cycled every 21 days for six months on 06/27/2013.  He started palliative XRT to esophageal mass on 06/09/2013 per Dr. Eppie Gonzalez. It ended on 06/23/2013 s/p 10 treatments.    INTERVAL HISTORY: Devin Gonzalez 58 y.o. male with a history of newly diagnosed metastatic GEJ adenocarcinoma is here for follow up. He is accompanied by his niece Devin Gonzalez..  He was seen by Dr. Juliann Gonzalez on 06/25/2013. He states he initially had improvement in his ability to swallow after he completed radiation however since starting chemotherapy he has had more difficulty with swallowing. Also noted decreased hearing particularly in the high-frequency range starting the second day after chemotherapy.   He denies melana or hematochezia.   ADDITIONAL ONCOLOGY HISTORY: As previously reported, he initially was evaluated by Dr. Osborne Gonzalez in one or two weeks prior to his visit with Dr. Carlean Gonzalez on 05/12/2013. He was having a "acid reflux" burning sensation and it bothered him at night. He also noted feeling as if his foods was feeling stuck. Due to persistence in these symptoms including odynophagia, reflux and solid dysphagia with weight loss (he lost 16 lbs over the past few months), her was referred to Dr. Carlean Gonzalez. Dr. Carlean Gonzalez scheduled him for an EGD on 05/13/2013. Of note, he had been on PPI bid since January with minimal improvement in symptoms. He  reported a longstanding smoking history for the past 20 plus years. He smokes about 0.5 packs per day. He drinks 3-4 beers per day (every other day) for the past 15 plus years. He had an EGD which showed circumferential mass at the gastroesophageal junction. Multiple biopsies were obtained and it were determined to esophageal adenocarcinoma without amplification of HER-2 detected. He had a CT chest and abdomen and pelvis on 05/19/2013, which showed a mass of 4.6 cm along the GE junction. The esophagus was dilated above this mass. There was a suspected 12 mm lymph node near the GE junction. There was also bulky gastrohepatic ligament adenopathy noted with combined lymph nodes measuring up to 3 cm. There was also borderdline celiac axis adenopathy all suspicious for metastatic disease. There were no findings suggestive of hepatic metastatic disease, osseous metastatic disease or lung disease.   MEDICAL HISTORY: Past Medical History  Diagnosis Date  . Allergy   . Hemorrhoids   . Back pain     right side  . Bunion, right foot   . Left tennis elbow   . Osteoarthritis     hands, knees, right hip  . Prostatitis   . Diverticulosis 2013  . Adenomatous colon polyp   . ED (erectile dysfunction)   . Tobacco abuse   . Reflux esophagitis   . Nonspecific elevation of levels of transaminase or lactic acid dehydrogenase (LDH)     INTERIM HISTORY: has Internal hemorrhoids with complication; Personal history of colonic polyps; Post-op pain; Anal pain; Malignant neoplasm of lower third of esophagus; Bone metastases; Brain metastasis; Visual changes;  Pancreatitis, acute; Acute pancreatitis; Epigastric abdominal pain; Hyperglycemia; Tobacco abuse; Protein-calorie malnutrition, severe; Oral pharyngeal candidiasis; Inadequate oral intake; Cancer related pain; Acute esophagitis; GERD (gastroesophageal reflux disease); Physical deconditioning; Leukopenia due to antineoplastic chemotherapy; Chemotherapy induced  thrombocytopenia; Intractable hiccups; Anorexia; Hearing loss; and Dizziness on his problem list.    ALLERGIES:  has no allergies on file.  MEDICATIONS: has a current medication list which includes the following prescription(s): chlorpromazine, dexamethasone, diphenhydramine hcl, fluconazole, lactose free nutrition, lidocaine, lidocaine-prilocaine, lorazepam, montelukast, nicotine, nicotine, nystatin, ondansetron, oxycodone, oxycodone-acetaminophen, pantoprazole, phenyleph-doxylamine-dm-apap, prochlorperazine, protein supplement, ranitidine, sucralfate, and tetrahydrozoline hcl.  SURGICAL HISTORY:  Past Surgical History  Procedure Laterality Date  . Cyst on testicle Right   . Appendectomy    . Tonsillectomy    . Knee surgery Bilateral     arthroscopic surg /  bil knees  . Fatty tumor Right     removal forearm  . Colonoscopy w/ polypectomy    . Sphincterotomy  08/28/2011    Procedure: SPHINCTEROTOMY;  Surgeon: Devin Gonzalez. Cornett, MD;  Location: [facility];  Service: General;  Laterality: N/A;  Lateral internal sphincterotomy  . Hemorrhoid surgery  08/28/2011    Procedure: HEMORRHOIDECTOMY;  Surgeon: Devin Gonzalez. Cornett, MD;  Location: [facility];  Service: General;  Laterality: N/A;  ,possible hemorrhoidectomy  . Examination under anesthesia  12/13/2011    Procedure: EXAM UNDER ANESTHESIA;  Surgeon: Devin Moores A. Cornett, MD;  Location: [facility];  Service: General;  Laterality: N/A;  . Anal fissure repair  2013    Gonzalez    REVIEW OF SYSTEMS:   Constitutional: Denies fevers, chills or abnormal weight loss Eyes: Denies blurriness of vision Ears, nose, mouth, throat, and face: Denies mucositis or sore throat; He denies hearing lost.  Respiratory: Denies cough, dyspnea or wheezes Cardiovascular: Denies palpitation, chest discomfort or lower extremity swelling Gastrointestinal:  Denies nausea, heartburn or change in bowel habits; as noted in HPI.  Skin: Denies abnormal skin rashes Lymphatics: Denies new  lymphadenopathy or easy bruising Neurological:Denies numbness, tingling or new weaknesses Behavioral/Psych: Mood is stable, no new changes  All other systems were reviewed with the patient and are negative.  PHYSICAL EXAMINATION: ECOG PERFORMANCE STATUS: 0 - Asymptomatic  Blood pressure 129/68, pulse 85, temperature 97.9 F (36.6 C), temperature source Oral, resp. rate 18, height 5' 4"  (1.626 m), weight 106 lb 1.6 oz (48.127 kg).  GENERAL:alert, no distress and comfortable; thin male in no acute distress SKIN: skin color, texture, turgor are normal, no rashes or significant lesions; R port a cath (placed on 06/09/2013). EYES: normal, Conjunctiva are pink and non-injected, sclera clear OROPHARYNX:no exudate, no erythema and lips, buccal mucosa, and tongue normal  NECK: supple, thyroid normal size, non-tender, without nodularity LYMPH:  no palpable lymphadenopathy in the cervical, axillary or supraclavicular LUNGS: clear to auscultation with normal breathing effort, no wheezes or rhonchi HEART: regular rate & rhythm and no murmurs and no lower extremity edema ABDOMEN:abdomen soft, non-tender and normal bowel sounds Musculoskeletal:no cyanosis of digits and no clubbing  NEURO: alert & oriented x 3 with fluent speech, no focal motor/sensory deficits  Labs:  Lab Results  Component Value Date   WBC 3.1* 07/10/2013   HGB 13.8 07/10/2013   HCT 42.3 07/10/2013   MCV 89.2 07/10/2013   PLT 353 07/10/2013   NEUTROABS 1.7 07/10/2013      Chemistry      Component Value Date/Time   NA 139 07/10/2013 1453   NA 132* 07/05/2013 0436   K 4.0 07/10/2013 1453  K 4.0 07/05/2013 0436   CL 97 07/05/2013 0436   CO2 26 07/10/2013 1453   CO2 24 07/05/2013 0436   BUN 9.8 07/10/2013 1453   BUN 9 07/05/2013 0436   CREATININE 0.9 07/10/2013 1453   CREATININE 0.89 07/05/2013 0436      Component Value Date/Time   CALCIUM 9.3 07/10/2013 1453   CALCIUM 8.6 07/05/2013 0436   ALKPHOS 57 07/10/2013 1453   ALKPHOS 73 06/28/2013 2349   AST  20 07/10/2013 1453   AST 24 06/28/2013 2349   ALT 25 07/10/2013 1453   ALT 28 06/28/2013 2349   BILITOT 0.37 07/10/2013 1453   BILITOT 0.2* 06/28/2013 2349       Basic Metabolic Panel:  Recent Labs Lab 07/05/13 0436 07/10/13 1453  NA 132* 139  K 4.0 4.0  CL 97  --   CO2 24 26  GLUCOSE 88 116  BUN 9 9.8  CREATININE 0.89 0.9  CALCIUM 8.6 9.3   GFR Estimated Creatinine Clearance: 62.4 ml/min (by C-G formula based on Cr of 0.9). Liver Function Tests:  Recent Labs Lab 07/10/13 1453  AST 20  ALT 25  ALKPHOS 57  BILITOT 0.37  PROT 6.5  ALBUMIN 3.1*   No results found for this basename: LIPASE, AMYLASE,  in the last 168 hours No results found for this basename: AMMONIA,  in the last 168 hours Coagulation profile No results found for this basename: INR, PROTIME,  in the last 168 hours  CBC:  Recent Labs Lab 07/04/13 0415 07/05/13 0436 07/10/13 1453  WBC 1.6* 1.7* 3.1*  NEUTROABS  --  1.1* 1.7  HGB 12.3* 12.1* 13.8  HCT 34.4* 34.3* 42.3  MCV 82.7 83.9 89.2  PLT 112* 95* 353    Anemia work up No results found for this basename: VITAMINB12, FOLATE, FERRITIN, TIBC, IRON, RETICCTPCT,  in the last 72 hours  Studies:   RADIOGRAPHIC STUDIES: MRI of the Brain 06/19/2013 MRI HEAD WITHOUT AND WITH CONTRAST  TECHNIQUE:  Multiplanar, multiecho pulse sequences of the brain and surrounding  structures were obtained without and with intravenous contrast.  CONTRAST: 30m MULTIHANCE GADOBENATE DIMEGLUMINE 529 MG/ML IV SOLN  COMPARISON: None.  FINDINGS:  There is no acute infarct. There is mild generalized cerebral  atrophy. There are a few small foci of T2 hyperintensity within the  deep cerebral white matter bilaterally, nonspecific and not greater  than expected for patient's age. There is a 3 mm focus of cortical  enhancement in the right superior frontal gyrus (series 10, image  51). No other abnormal enhancement is identified. There is no  midline shift, intracranial  hemorrhage, or extra-axial fluid  collection.  Orbits are unremarkable. There is mild-to-moderate right maxillary  sinus mucosal thickening. Mastoid air cells are clear. Major  intracranial vascular flow voids are preserved.  IMPRESSION:  Single 3 mm enhancing lesion in the right frontal lobe, concerning  for solitary metastasis.   ASSESSMENT: Devin LIPPY89y.o. male with a history of Malignant neoplasm of lower third of esophagus - Plan: CBC with Differential, Comprehensive metabolic panel (Cmet) - [facility], Lactate dehydrogenase (LDH) - [facility]  Decreased hearing, unspecified laterality - Plan: Ambulatory referral to ENT   PLAN:   1. Metastatic esophageal adenocarcinoma (GE junction), Stage IV --On prior visit, we reviewed his scans and pathology extensively consistent with the above. CT of chest and abdomen showed a long segment distal esophageal mass extending right to the GE junction that was approximately the 4.6 cm along with  enlarged aorticopulmonary window lymph node, GE junction lymph node, gastrohepatic ligament adenopathy and borderline celiac axis adenopathy all suspicious for metastatic disease likely Stage IV.  We obtained a PET to see which nodes express FDG avidity and to determine if preoperative chemotherapy or chemoradiation prior to esophagectomy or definitive chemoradiation (given his good functional status) is appropriate. His Her2 was negative. His PET confirmed metastatic lymphadenopathy in mediastinum, gastrohepatic ligament, and right abdominal retroperitoneum.  Two intramuscular hypermetabolic foci within right shoulder for a girl, highly suspicious for intramuscular metastases.  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    Based on care and in NCCN guidelines version 3.2015 for Esophageal and esophagogastric jucntion cancers for metastatic systemic therapies, first line therapy with ECF is catagory 1 preferred regimens for medically fit patients with good PS and access to  frequent toxicity evaluation.   Given his good functional status and lack of neurotoxicity, nephrotoxicity or hearing problems, we recommended systemic chemotherapy in the form of ECF (epirubicin, cisplatin, and fluorouracil).   Dr. Juliann Gonzalez discussed the indications for treatment would palliation for his cancer which is not curable given his PET findings, and the  benefits, and risks of chemotherapy in detail. The risks includes but is not limited to myelotoxicity, renal dysfunction, leukocytopenia or diarrhea, neurotoxicity, palmar plantar erythrodysestesiasis, hearing toxicity and other toxicities. He consented and agreed to proceed with chemotherapy understanding these risks and benefits. He proceeded with day 1 of cycle 1 on 06/26/2013 as follows:  --Epirubicin 50 mg per meter squared IV on day one  --Cisplatin 60 mg per meter squared IV on day one  --Fluorouracil 200 mg per meter squared IV continuous infusion over 24 hours daily on days 1 through 21   We will plan on 6-8 cycles of treatment or unacceptable toxicity.  References are as follows: Clint Lipps, et. Al, NEJM 9323;557:32 and Sumpter K, et al. Br J Cancer 2005; 20:2542.     --Chemotherapy was discontinued on 07/03/2013 secondary to ongoing severe esophagitis. Patient was reviewed with also seen by Dr. Juliann Gonzalez. Considering his significant esophagitis as well as received hearing changes after the first cycle of chemotherapy we will hold further chemotherapy at this time particularly with cisplatin in this setting. Oversedation back to cardiology for a repeat exam to determine any hearing loss. We will consider changing the patient's chemotherapy to EOX. He also discussed the possibility of a PEG tube placement to improve his nutritional status. We will see if the Xeloda can be crushed,dissolved, or given via PEG tube. After review with our [facility] pharmacists, we did find that the Xeloda cannot be crushed but it can be dissolved and can be  given via PEG tube.   --Given his dysphagia and weight lost, he saw Dr. Isidore Moos in consideration of focal palliative XRT to his esophogeal mass. He was started on 06/08 and finished on 06/22.    He notes some improvement in his swallowing initially.  --He has evidence of bone metastases, and bisphosphonate therapy will also be considered.   2. Visual Changes. --He had an MRI of brain as noted above with one isolated brain metastases.  We will refer to ophthalmology today.  Single isolated brain met (3 mm in size) is unlikely to cause his visual changes. He reports increased blurriness and sensitivity to bright lights, but no further visual changes.   3. Tobacco Abuse.  --Referral to tobacco cessation classes were already made by Dr. Juliann Gonzalez  4. Follow up.  --Patient will follow up in 1 weeks  for a symptom visit and in consideration of chemotherapy, with EOX.    All questions were answered. The patient knows to call the clinic with any problems, questions or concerns. We can certainly see the patient much sooner if necessary.     Carlton Adam, PA-C 07/10/2013 5:03 PM

## 2013-07-13 NOTE — Patient Instructions (Signed)
You will be scheduled for another hearing test to evaluate your complaints of hearing loss Followup in 1 week

## 2013-07-16 ENCOUNTER — Other Ambulatory Visit: Payer: Self-pay | Admitting: Internal Medicine

## 2013-07-16 ENCOUNTER — Ambulatory Visit (HOSPITAL_BASED_OUTPATIENT_CLINIC_OR_DEPARTMENT_OTHER): Payer: BC Managed Care – PPO

## 2013-07-16 ENCOUNTER — Encounter: Payer: Self-pay | Admitting: Internal Medicine

## 2013-07-16 ENCOUNTER — Ambulatory Visit: Payer: BC Managed Care – PPO

## 2013-07-16 ENCOUNTER — Telehealth: Payer: Self-pay | Admitting: Internal Medicine

## 2013-07-16 ENCOUNTER — Ambulatory Visit (HOSPITAL_BASED_OUTPATIENT_CLINIC_OR_DEPARTMENT_OTHER): Payer: BC Managed Care – PPO | Admitting: Internal Medicine

## 2013-07-16 VITALS — BP 149/80 | HR 84 | Temp 98.5°F | Resp 19 | Ht 64.0 in | Wt 105.8 lb

## 2013-07-16 DIAGNOSIS — C7931 Secondary malignant neoplasm of brain: Secondary | ICD-10-CM

## 2013-07-16 DIAGNOSIS — C7951 Secondary malignant neoplasm of bone: Secondary | ICD-10-CM

## 2013-07-16 DIAGNOSIS — C7952 Secondary malignant neoplasm of bone marrow: Secondary | ICD-10-CM

## 2013-07-16 DIAGNOSIS — F17200 Nicotine dependence, unspecified, uncomplicated: Secondary | ICD-10-CM

## 2013-07-16 DIAGNOSIS — C155 Malignant neoplasm of lower third of esophagus: Secondary | ICD-10-CM

## 2013-07-16 DIAGNOSIS — H9190 Unspecified hearing loss, unspecified ear: Secondary | ICD-10-CM

## 2013-07-16 DIAGNOSIS — C778 Secondary and unspecified malignant neoplasm of lymph nodes of multiple regions: Secondary | ICD-10-CM

## 2013-07-16 DIAGNOSIS — H539 Unspecified visual disturbance: Secondary | ICD-10-CM

## 2013-07-16 DIAGNOSIS — F1010 Alcohol abuse, uncomplicated: Secondary | ICD-10-CM

## 2013-07-16 DIAGNOSIS — C7949 Secondary malignant neoplasm of other parts of nervous system: Secondary | ICD-10-CM

## 2013-07-16 LAB — CBC WITH DIFFERENTIAL/PLATELET
BASO%: 0.4 % (ref 0.0–2.0)
Basophils Absolute: 0 10*3/uL (ref 0.0–0.1)
EOS%: 0.7 % (ref 0.0–7.0)
Eosinophils Absolute: 0.1 10*3/uL (ref 0.0–0.5)
HCT: 42.7 % (ref 38.4–49.9)
HEMOGLOBIN: 14.9 g/dL (ref 13.0–17.1)
LYMPH%: 16.4 % (ref 14.0–49.0)
MCH: 29.6 pg (ref 27.2–33.4)
MCHC: 34.9 g/dL (ref 32.0–36.0)
MCV: 84.9 fL (ref 79.3–98.0)
MONO#: 1.2 10*3/uL — AB (ref 0.1–0.9)
MONO%: 17.2 % — ABNORMAL HIGH (ref 0.0–14.0)
NEUT#: 4.6 10*3/uL (ref 1.5–6.5)
NEUT%: 65.3 % (ref 39.0–75.0)
Platelets: 528 10*3/uL — ABNORMAL HIGH (ref 140–400)
RBC: 5.03 10*6/uL (ref 4.20–5.82)
RDW: 13.1 % (ref 11.0–14.6)
WBC: 7.1 10*3/uL (ref 4.0–10.3)
lymph#: 1.2 10*3/uL (ref 0.9–3.3)
nRBC: 0 % (ref 0–0)

## 2013-07-16 LAB — COMPREHENSIVE METABOLIC PANEL (CC13)
ALBUMIN: 3.3 g/dL — AB (ref 3.5–5.0)
ALK PHOS: 102 U/L (ref 40–150)
ALT: 27 U/L (ref 0–55)
AST: 36 U/L — AB (ref 5–34)
Anion Gap: 10 mEq/L (ref 3–11)
BUN: 7.6 mg/dL (ref 7.0–26.0)
CHLORIDE: 100 meq/L (ref 98–109)
CO2: 26 mEq/L (ref 22–29)
Calcium: 9.8 mg/dL (ref 8.4–10.4)
Creatinine: 1 mg/dL (ref 0.7–1.3)
Glucose: 98 mg/dl (ref 70–140)
POTASSIUM: 4.3 meq/L (ref 3.5–5.1)
Sodium: 136 mEq/L (ref 136–145)
TOTAL PROTEIN: 7.3 g/dL (ref 6.4–8.3)
Total Bilirubin: 0.27 mg/dL (ref 0.20–1.20)

## 2013-07-16 LAB — LACTATE DEHYDROGENASE (CC13): LDH: 181 U/L (ref 125–245)

## 2013-07-16 NOTE — Telephone Encounter (Signed)
Pt confirmed labs/ov per 07/15 POF, gave pt AVS, pt wanted [place] to call his niece when we have an apt for the Peg Tube...Marland KitchenMarland KitchenKJ

## 2013-07-16 NOTE — Progress Notes (Signed)
[facility] OFFICE PROGRESS NOTE  [name], MD [place] [address]  DIAGNOSIS: Malignant neoplasm of lower third of esophagus - Plan: Ambulatory referral to Interventional Radiology, CBC with Differential, Comprehensive metabolic panel (Cmet) - [facility], Lactate dehydrogenase (LDH) - [facility]  Chief Complaint  Patient presents with  . Malignant neoplasm of lower third of esophagus    PRIOR TREATMENT:  Started (ECF) consisting of  Epirubicin 50 mg/m2 IV, Cisplatin 60 mg/m2 IV on day #1, Fluorouracil 200 mg/m2 per day IV continuous infusion over 24 hours daily on days 1-21 cycled every 21 days for six months on 06/27/2013.  He tolerated part of his first cycle and was hospitalized due to worsening esophagitis and had notable hearing lost.  He started palliative XRT to esophageal mass on 06/09/2013 per Dr. Eppie Gibson. It ended on 06/23/2013 s/p 10 treatments.  He takes that protonix once daily.  He is taking zantac twice daily.    CURRENT TREATMENT: Planning to start EOX given toxicity related to cisplatin including worsening hearing lost.  He request PEG tube placement prior to start.  His tentative start date is 07/30/2013.   INTERVAL HISTORY: Devin Gonzalez 58 y.o. male with a history of newly diagnosed metastatic GEJ adenocarcinoma is here for follow up.  He was seen by Awilda Metro, PA on 07/10/2013.  He does Diflucan daily.  He is tolerating Boost Plus bid.  He reports that the hiccups is still about the same on baclofen with some sedation.  He takes oxycodone-acetaminophen once or twice a day.  He is requesting to have PEG tube placement due to continued esophageal spasms and discomfort.  He is here with his family member Devin Gonzalez.   ADDITIONAL ONCOLOGY HISTORY: As previously reported, he initially was evaluated by Dr. Osborne Casco in one or two weeks prior to his visit with Dr. Carlean Purl on 05/12/2013. He was having a "acid reflux" burning sensation and it  bothered him at night. He also noted feeling as if his foods was feeling stuck. Due to persistence in these symptoms including odynophagia, reflux and solid dysphagia with weight loss (he lost 16 lbs over the past few months), her was referred to Dr. Carlean Purl. Dr. Carlean Purl scheduled him for an EGD on 05/13/2013. Of note, he had been on PPI bid since January with minimal improvement in symptoms. He reported a longstanding smoking history for the past 20 plus years. He smokes about 0.5 packs per day. He drinks 3-4 beers per day (every other day) for the past 15 plus years. He had an EGD which showed circumferential mass at the gastroesophageal junction. Multiple biopsies were obtained and it were determined to esophageal adenocarcinoma without amplification of HER-2 detected. He had a CT chest and abdomen and pelvis on 05/19/2013, which showed a mass of 4.6 cm along the GE junction. The esophagus was dilated above this mass. There was a suspected 12 mm lymph node near the GE junction. There was also bulky gastrohepatic ligament adenopathy noted with combined lymph nodes measuring up to 3 cm. There was also borderdline celiac axis adenopathy all suspicious for metastatic disease. There were no findings suggestive of hepatic metastatic disease, osseous metastatic disease or lung disease.   MEDICAL HISTORY: Past Medical History  Diagnosis Date  . Allergy   . Hemorrhoids   . Back pain     right side  . Bunion, right foot   . Left tennis elbow   . Osteoarthritis     hands, knees, right  hip  . Prostatitis   . Diverticulosis 2013  . Adenomatous colon polyp   . ED (erectile dysfunction)   . Tobacco abuse   . Reflux esophagitis   . Nonspecific elevation of levels of transaminase or lactic acid dehydrogenase (LDH)     INTERIM HISTORY: has Internal hemorrhoids with complication; Personal history of colonic polyps; Post-op pain; Anal pain; Malignant neoplasm of lower third of esophagus; Bone metastases; Brain  metastasis; Visual changes; Pancreatitis, acute; Acute pancreatitis; Epigastric abdominal pain; Hyperglycemia; Tobacco abuse; Protein-calorie malnutrition, severe; Oral pharyngeal candidiasis; Inadequate oral intake; Cancer related pain; Acute esophagitis; GERD (gastroesophageal reflux disease); Physical deconditioning; Leukopenia due to antineoplastic chemotherapy; Chemotherapy induced thrombocytopenia; Intractable hiccups; Anorexia; Hearing loss; and Dizziness on his problem list.    ALLERGIES:  has No Known Allergies.  MEDICATIONS: has a current medication list which includes the following prescription(s): baclofen, diphenhydramine hcl, fluconazole, lactose free nutrition, lidocaine, lidocaine-prilocaine, montelukast, nicotine, nystatin, oxycodone, oxycodone-acetaminophen, pantoprazole, phenyleph-doxylamine-dm-apap, protein supplement, ranitidine, sucralfate, and tetrahydrozoline hcl.  SURGICAL HISTORY:  Past Surgical History  Procedure Laterality Date  . Cyst on testicle Right   . Appendectomy    . Tonsillectomy    . Knee surgery Bilateral     arthroscopic surg /  bil knees  . Fatty tumor Right     removal forearm  . Colonoscopy w/ polypectomy    . Sphincterotomy  08/28/2011    Procedure: SPHINCTEROTOMY;  Surgeon: Joyice Faster. Cornett, MD;  Location: [facility];  Service: General;  Laterality: N/A;  Lateral internal sphincterotomy  . Hemorrhoid surgery  08/28/2011    Procedure: HEMORRHOIDECTOMY;  Surgeon: Joyice Faster. Cornett, MD;  Location: [facility];  Service: General;  Laterality: N/A;  ,possible hemorrhoidectomy  . Examination under anesthesia  12/13/2011    Procedure: EXAM UNDER ANESTHESIA;  Surgeon: Marcello Moores A. Cornett, MD;  Location: [facility];  Service: General;  Laterality: N/A;  . Anal fissure repair  2013    Cornett    REVIEW OF SYSTEMS:   Constitutional: Denies fevers, chills or weight loss as noted in HPI Eyes: Denies blurriness of vision Ears, nose, mouth, throat, and face: Reports mucositis  ; He reports hearing lost.  Respiratory: Denies cough, dyspnea or wheezes Cardiovascular: Denies palpitation, chest discomfort or lower extremity swelling Gastrointestinal:  Denies nausea, heartburn or change in bowel habits; as noted in HPI.  Skin: Denies abnormal skin rashes Lymphatics: Denies new lymphadenopathy or easy bruising Neurological:Denies numbness, tingling or new weaknesses Behavioral/Psych: Mood is stable, no new changes  All other systems were reviewed with the patient and are negative.  PHYSICAL EXAMINATION: ECOG PERFORMANCE STATUS: 0 - Asymptomatic  Blood pressure 149/80, pulse 84, temperature 98.5 F (36.9 C), temperature source Oral, resp. rate 19, height _0  (1.626 m), weight 105 lb 12.8 oz (47.991 kg).  GENERAL:alert, no distress and comfortable; thin male in no acute distress SKIN: skin color, texture, turgor are normal, no rashes or significant lesions; R port a cath (placed on 06/09/2013). EYES: normal, Conjunctiva are pink and non-injected, sclera clear OROPHARYNX:no exudate, no erythema and lips, buccal mucosa, and tongue normal  NECK: supple, thyroid normal size, non-tender, without nodularity LYMPH:  no palpable lymphadenopathy in the cervical, axillary or supraclavicular LUNGS: clear to auscultation with normal breathing effort, no wheezes or rhonchi HEART: regular rate & rhythm and no murmurs and no lower extremity edema ABDOMEN:abdomen soft, non-tender and normal bowel sounds Musculoskeletal:no cyanosis of digits and no clubbing  NEURO: alert & oriented x 3 with fluent speech, no focal motor/sensory  deficits  Labs:  Lab Results  Component Value Date   WBC 7.1 07/16/2013   HGB 14.9 07/16/2013   HCT 42.7 07/16/2013   MCV 84.9 07/16/2013   PLT 528* 07/16/2013   NEUTROABS 4.6 07/16/2013      Chemistry      Component Value Date/Time   NA 136 07/16/2013 1528   NA 132* 07/05/2013 0436   K 4.3 07/16/2013 1528   K 4.0 07/05/2013 0436   CL 97 07/05/2013 0436    CO2 26 07/16/2013 1528   CO2 24 07/05/2013 0436   BUN 7.6 07/16/2013 1528   BUN 9 07/05/2013 0436   CREATININE 1.0 07/16/2013 1528   CREATININE 0.89 07/05/2013 0436      Component Value Date/Time   CALCIUM 9.8 07/16/2013 1528   CALCIUM 8.6 07/05/2013 0436   ALKPHOS 102 07/16/2013 1528   ALKPHOS 73 06/28/2013 2349   AST 36* 07/16/2013 1528   AST 24 06/28/2013 2349   ALT 27 07/16/2013 1528   ALT 28 06/28/2013 2349   BILITOT 0.27 07/16/2013 1528   BILITOT 0.2* 06/28/2013 2349       Basic Metabolic Panel:  Recent Labs Lab 07/10/13 1453 07/16/13 1528  NA 139 136  K 4.0 4.3  CO2 26 26  GLUCOSE 116 98  BUN 9.8 7.6  CREATININE 0.9 1.0  CALCIUM 9.3 9.8   GFR Estimated Creatinine Clearance: 56 ml/min (by C-G formula based on Cr of 1). Liver Function Tests:  Recent Labs Lab 07/10/13 1453 07/16/13 1528  AST 20 36*  ALT 25 27  ALKPHOS 57 102  BILITOT 0.37 0.27  PROT 6.5 7.3  ALBUMIN 3.1* 3.3*    CBC:  Recent Labs Lab 07/10/13 1453 07/16/13 1529  WBC 3.1* 7.1  NEUTROABS 1.7 4.6  HGB 13.8 14.9  HCT 42.3 42.7  MCV 89.2 84.9  PLT 353 528*    Anemia work up No results found for this basename: VITAMINB12, FOLATE, FERRITIN, TIBC, IRON, RETICCTPCT,  in the last 72 hours  Studies:   RADIOGRAPHIC STUDIES: None  ASSESSMENT: Devin Gonzalez 58 y.o. male with a history of Malignant neoplasm of lower third of esophagus - Plan: Ambulatory referral to Interventional Radiology, CBC with Differential, Comprehensive metabolic panel (Cmet) - [facility], Lactate dehydrogenase (LDH) - [facility]   PLAN:   1. Metastatic esophageal adenocarcinoma (GE junction), Stage IV --On prior visit, we reviewed his scans and pathology extensively consistent with the above. CT of chest and abdomen showed a long segment distal esophageal mass extending right to the GE junction that was approximately the 4.6 cm along with enlarged aorticopulmonary window lymph node, GE junction lymph node, gastrohepatic ligament  adenopathy and borderline celiac axis adenopathy all suspicious for metastatic disease likely Stage IV.  We obtained a PET to see which nodes express FDG avidity and to determine if preoperative chemotherapy or chemoradiation prior to esophagectomy or definitive chemoradiation (given his good functional status) is appropriate. His Her2 was negative. His PET confirmed metastatic lymphadenopathy in mediastinum, gastrohepatic ligament, and right abdominal retroperitoneum.  Two intramuscular hypermetabolic foci within right shoulder for a girl, highly suspicious for intramuscular metastases.  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    Based on care and in NCCN guidelines version 3.2015 for Esophageal and esophagogastric jucntion cancers for metastatic systemic therapies, first line therapy with ECF is catagory 1 preferred regimens for medically fit patients with good PS and access to frequent toxicity evaluation. We initially chose this regiment based on his preference to avoid oral chemotherapy  in the setting of ongoing odynophagia and dysphagia.  He received one dose as noted above and had moderate to severe ototoxicity requiring discontinuation of ECF.   We will therefore switch to EOX (epirubicin, oxaliplatin and capecitabine).   He will have a PEG tube placed prior to starting this chemotherapy regiment.  It is based clinical trials demonstrating  that outcomes were comparable when capecitabine was substituted for infusional 5-FU in the ECF regimen, a finding that was reinforced in a subsequent meta-analysis of this and one other trial. They also showed (as did the meta-analysis that outcomes were comparable when oxaliplatin was substituted for cisplatin.  EOX had a median survival of 11.2 months.   --We also discussed again the indications for treatment would palliation for his cancer which is not curable given his PET findings and MRI of the brain, and the  benefits, and risks of chemotherapy in detail. The risks  includes but is not limited to myelotoxicity, renal dysfunction, leukocytopenia or diarrhea, neurotoxicity, palmar plantar erythrodysestesiasis,  and other toxicities. He consented and agreed to proceed with chemotherapy understanding these risks and benefits. He will proceed with chemotherapy on 07/30/2013 with the following regimen:   --Epirubicin 50 mg per meter squared IV on day one  --Oxaliplatin 130 mg per meter squared IV on day one  --Capecitabine  625 mg per meter squared oral on days 1 through 14 every 21 days.    We will plan on 6-7 cycles of treatment or unacceptable toxicity.  References are as follows: Clint Lipps, et. Al, NEJM 5726;203:55 and Sumpter K, et al. Br J Cancer 2005; 97:4163.     --We will monitor CBC with differential and platelet count day 1 prior to these treatment cycle. We will assess BMP including creatinine, liver function test once per cycle on day 1 prior to each treatment cycle. We will monitor for neurotoxicity prior to each treatment cycle.   Prophylaxis with G-CSF is not warranted with the incidence of grade 3 or 4 febrile neutropenia is at 9%.   --Given his dysphagia and weight lost, he saw Dr. Isidore Moos in consideration of focal palliative XRT to his esophogeal mass. He was started on 06/08 and finished on 06/22.    He notes some improvement in his swallowing.   --He has evidence of bone metastases, and bisphosphonate therapy will also be considered. He had an MRI of brain with a solitary brain metastasis.   2. Visual Changes. --He had an MRI of brain as noted above with one isolated brain metastases.   Single isolated brain met (3 mm in size) is unlikely to cause his visual changes.   3. Hearing lost. --He was evaluated by audiology with significant hearing lost.  Recommended to stop cisplatin. He is being evaluated for hearing aide placement.   4. Tobacco Abuse.  --Referral to tobacco cessation classes were made last visit   5. Follow up.  --Patient  will follow up in 3 weeks with for a symptom visit and in consideration of chemotherapy, cycle #2 of ECF.  Labs weekly for his first cycle of ECF.   All questions were answered. The patient knows to call the clinic with any problems, questions or concerns. We can certainly see the patient much sooner if necessary.  I spent 25 minutes counseling the patient face to face. The total time spent in the appointment was 40 minutes.    Garvis Downum, MD 07/17/2013 11:36 AM

## 2013-07-16 NOTE — Patient Instructions (Addendum)
PEG, Home Care You had a percutaneous endoscopic gastrostomy (PEG) tube put in your stomach. Do not put anything in your feeding tube until you have been shown how to use it. HOME CARE For the first 24 hours:  Do not sign any important or legal papers.  Do not drive. Every day:  Check the place where the tube goes into your belly. See your doctor if the tube site is red, puffy (swollen), or smells bad.  Clean around the tube with soap and water. Dry the area by patting it gently with a clean towel.  Do not put a bandage around the tube site. Keep the area dry.  You should turn the bumper on the outside of your belly. The bumper should lie flat against your skin so you are comfortable.  Do not put whole pills through the tube. Instead, crush and dissolve all pills in warm water before placing down tube. GET HELP RIGHT AWAY IF:  You throw up (vomit).  The pain in your belly gets worse.  You have trouble breathing.  You have a temperature by mouth above 102 F (38.9 C), not controlled by medicine.  You have pain when you swallow.  You have chest pain.  There is green or yellow fluid (drainage) around the tube site that smells bad.  There is redness and puffiness around the tube site.  The tube comes out. MAKE SURE YOU:  Understand these instructions.  Will watch your condition.  Will get help right away if you are not doing well or get worse. Document Released: 01/21/2010 Document Revised: 08/21/2012 Document Reviewed: 06/20/2012 [facility] Patient Information 2015 [place], [place]. This information is not intended to replace advice given to you by your health care provider. Make sure you discuss any questions you have with your health care provider.   Epirubicin injection What is this medicine? EPIRUBICIN (ep i ROO bi sin) is a chemotherapy drug. This medicine is used to treat breast cancer. This medicine may be used for other purposes; ask your health care  provider or pharmacist if you have questions. COMMON BRAND NAME(S): Ellence What should I tell my health care provider before I take this medicine? They need to know if you have any of these conditions: -blood disorders -heart disease, recent heart attack -infection (especially a virus infection such as chickenpox, cold sores, or herpes) -irregular heartbeat -kidney disease -liver disease -recent or ongoing radiation therapy -an unusual or allergic reaction to epirubicin, other chemotherapy agents, other medicines, foods, dyes, or preservatives -pregnant or trying to get pregnant -breast-feeding How should I use this medicine? This drug is given as an infusion into a vein. It is administered in a hospital or clinic by a specially trained health care professional. If you have pain, swelling, burning or any unusual feeling around the site of your injection, tell your health care professional right away. Talk to your pediatrician regarding the use of this medicine in children. Special care may be needed. Overdosage: If you think you have taken too much of this medicine contact a poison control center or emergency room at once. NOTE: This medicine is only for you. Do not share this medicine with others. What if I miss a dose? It is important not to miss your dose. Call your doctor or health care professional if you are unable to keep an appointment. What may interact with this medicine? Do not take this medicine with any of the following medications: -cisapride -droperidol -halofantrine -pimozide This medicine may also  interact with the following medications: -chloroquine -chlorpromazine -clarithromycin -cimetidine -cyclosporine -erythromycin -medicines for blood pressure like amlodipine, felodipine, nifedipine -medicines for depression, anxiety, or psychotic disturbances -medicines for irregular heart beat like amiodarone, bepridil, dofetilide, encainide, flecainide, propafenone,  quinidine -medicines for nausea, vomiting like dolasetron, ondansetron, palonosetron -medicines to increase blood counts like filgrastim, pegfilgrastim, sargramostim -methadone -methotrexate -pentamidine -vaccines Talk to your doctor or health care professional before taking any of these medicines: -acetaminophen -aspirin -ibuprofen -ketoprofen -naproxen This list may not describe all possible interactions. Give your health care provider a list of all the medicines, herbs, non-prescription drugs, or dietary supplements you use. Also tell them if you smoke, drink alcohol, or use illegal drugs. Some items may interact with your medicine. What should I watch for while using this medicine? Your condition will be monitored carefully while you are receiving this medicine. You will need important blood work done while you are taking this medicine. This drug may make you feel generally unwell. This is not uncommon, as chemotherapy can affect healthy cells as well as cancer cells. Report any side effects. Continue your course of treatment even though you feel ill unless your doctor tells you to stop. Your urine may turn red for a few days after your dose. This is not blood. If your urine is dark or brown, call your doctor. In some cases, you may be given additional medicines to help with side effects. Follow all directions for their use. Call your doctor or health care professional for advice if you get a fever, chills or sore throat, or other symptoms of a cold or flu. Do not treat yourself. This drug decreases your body's ability to fight infections. Try to avoid being around people who are sick. This medicine may increase your risk to bruise or bleed. Call your doctor or health care professional if you notice any unusual bleeding. Be careful brushing and flossing your teeth or using a toothpick because you may get an infection or bleed more easily. If you have any dental work done, tell your dentist  you are receiving this medicine. Avoid taking products that contain aspirin, acetaminophen, ibuprofen, naproxen, or ketoprofen unless instructed by your doctor. These medicines may hide a fever. Men and women of childbearing age should use effective birth control methods while using taking this medicine. Do not become pregnant while taking this medicine. There is a potential for serious side effects to an unborn child. Talk to your health care professional or pharmacist for more information. Do not breast-feed an infant while taking this medicine. There is a maximum amount of this medicine you should receive throughout your life. The amount depends on the medical condition being treated and your overall health. Your doctor will watch how much of this medicine you receive in your lifetime. Tell your doctor if you have taken this medicine before. What side effects may I notice from receiving this medicine? Side effects that you should report to your doctor or health care professional as soon as possible: -allergic reactions like skin rash, itching or hives, swelling of the face, lips, or tongue -low blood counts - this medicine may decrease the number of white blood cells, red blood cells and platelets. You may be at increased risk for infections and bleeding. -signs of infection - fever or chills, cough, sore throat, pain or difficulty passing urine -signs of decreased platelets or bleeding - bruising, pinpoint red spots on the skin, black, tarry stools, blood in the urine -signs of decreased red blood  cells - unusually weak or tired, fainting spells, lightheadedness -breathing problems -chest pain -gout pain -fast, irregular heartbeat -mouth sores -pain, swelling, redness at site where injected -swelling of ankles, feet, or hands Side effects that usually do not require medical attention (report to your doctor or health care professional if they continue or are bothersome): -changes in skin or  nail color -diarrhea -hair loss -hot flashes, facial flushing -increased skin sensitivity to the sun -loss of appetite -nausea, vomiting -red colored urine -stomach upset This list may not describe all possible side effects. Call your doctor for medical advice about side effects. You may report side effects to FDA at 1-800-FDA-1088. Where should I keep my medicine? This drug is given in a hospital or clinic and will not be stored at home. NOTE: This sheet is a summary. It may not cover all possible information. If you have questions about this medicine, talk to your doctor, pharmacist, or health care provider.  2015, [employer]. (2012-04-16 86:57:84)   Oxaliplatin Injection What is this medicine? OXALIPLATIN (ox AL i PLA tin) is a chemotherapy drug. It targets fast dividing cells, like cancer cells, and causes these cells to die. This medicine is used to treat cancers of the colon and rectum, and many other cancers. This medicine may be used for other purposes; ask your health care provider or pharmacist if you have questions. COMMON BRAND NAME(S): Eloxatin What should I tell my health care provider before I take this medicine? They need to know if you have any of these conditions: -kidney disease -an unusual or allergic reaction to oxaliplatin, other chemotherapy, other medicines, foods, dyes, or preservatives -pregnant or trying to get pregnant -breast-feeding How should I use this medicine? This drug is given as an infusion into a vein. It is administered in a hospital or clinic by a specially trained health care professional. Talk to your pediatrician regarding the use of this medicine in children. Special care may be needed. Overdosage: If you think you have taken too much of this medicine contact a poison control center or emergency room at once. NOTE: This medicine is only for you. Do not share this medicine with others. What if I miss a dose? It is important not  to miss a dose. Call your doctor or health care professional if you are unable to keep an appointment. What may interact with this medicine? -medicines to increase blood counts like filgrastim, pegfilgrastim, sargramostim -probenecid -some antibiotics like amikacin, gentamicin, neomycin, polymyxin B, streptomycin, tobramycin -zalcitabine Talk to your doctor or health care professional before taking any of these medicines: -acetaminophen -aspirin -ibuprofen -ketoprofen -naproxen This list may not describe all possible interactions. Give your health care provider a list of all the medicines, herbs, non-prescription drugs, or dietary supplements you use. Also tell them if you smoke, drink alcohol, or use illegal drugs. Some items may interact with your medicine. What should I watch for while using this medicine? Your condition will be monitored carefully while you are receiving this medicine. You will need important blood work done while you are taking this medicine. This medicine can make you more sensitive to cold. Do not drink cold drinks or use ice. Cover exposed skin before coming in contact with cold temperatures or cold objects. When out in cold weather wear warm clothing and cover your mouth and nose to warm the air that goes into your lungs. Tell your doctor if you get sensitive to the cold. This drug may make you feel generally unwell.  This is not uncommon, as chemotherapy can affect healthy cells as well as cancer cells. Report any side effects. Continue your course of treatment even though you feel ill unless your doctor tells you to stop. In some cases, you may be given additional medicines to help with side effects. Follow all directions for their use. Call your doctor or health care professional for advice if you get a fever, chills or sore throat, or other symptoms of a cold or flu. Do not treat yourself. This drug decreases your body's ability to fight infections. Try to avoid being  around people who are sick. This medicine may increase your risk to bruise or bleed. Call your doctor or health care professional if you notice any unusual bleeding. Be careful brushing and flossing your teeth or using a toothpick because you may get an infection or bleed more easily. If you have any dental work done, tell your dentist you are receiving this medicine. Avoid taking products that contain aspirin, acetaminophen, ibuprofen, naproxen, or ketoprofen unless instructed by your doctor. These medicines may hide a fever. Do not become pregnant while taking this medicine. Women should inform their doctor if they wish to become pregnant or think they might be pregnant. There is a potential for serious side effects to an unborn child. Talk to your health care professional or pharmacist for more information. Do not breast-feed an infant while taking this medicine. Call your doctor or health care professional if you get diarrhea. Do not treat yourself. What side effects may I notice from receiving this medicine? Side effects that you should report to your doctor or health care professional as soon as possible: -allergic reactions like skin rash, itching or hives, swelling of the face, lips, or tongue -low blood counts - This drug may decrease the number of white blood cells, red blood cells and platelets. You may be at increased risk for infections and bleeding. -signs of infection - fever or chills, cough, sore throat, pain or difficulty passing urine -signs of decreased platelets or bleeding - bruising, pinpoint red spots on the skin, black, tarry stools, nosebleeds -signs of decreased red blood cells - unusually weak or tired, fainting spells, lightheadedness -breathing problems -chest pain, pressure -cough -diarrhea -jaw tightness -mouth sores -nausea and vomiting -pain, swelling, redness or irritation at the injection site -pain, tingling, numbness in the hands or feet -problems with  balance, talking, walking -redness, blistering, peeling or loosening of the skin, including inside the mouth -trouble passing urine or change in the amount of urine Side effects that usually do not require medical attention (report to your doctor or health care professional if they continue or are bothersome): -changes in vision -constipation -hair loss -loss of appetite -metallic taste in the mouth or changes in taste -stomach pain This list may not describe all possible side effects. Call your doctor for medical advice about side effects. You may report side effects to FDA at 1-800-FDA-1088. Where should I keep my medicine? This drug is given in a hospital or clinic and will not be stored at home. NOTE: This sheet is a summary. It may not cover all possible information. If you have questions about this medicine, talk to your doctor, pharmacist, or health care provider.  2015, [employer]. (2007-07-16 17:22:47)   Capecitabine tablets What is this medicine? CAPECITABINE (ka pe SITE a been) is a chemotherapy drug. It slows the growth of cancer cells. This medicine is used to treat breast cancer, and also colon or rectal  cancer. This medicine may be used for other purposes; ask your health care provider or pharmacist if you have questions. COMMON BRAND NAME(S): Xeloda What should I tell my health care provider before I take this medicine? They need to know if you have any of these conditions: -bleeding or blood disorders -dihydropyrimidine dehydrogenase (DPD) deficiency -heart disease -infection (especially a virus infection such as chickenpox, cold sores, or herpes) -kidney disease -liver disease -an unusual or allergic reaction to capecitabine, 5-fluorouracil, other medicines, foods, dyes, or preservatives -pregnant or trying to get pregnant -breast-feeding How should I use this medicine? Take this medicine by mouth with a glass of water, within 30 minutes of the end of  a meal. Do not cut, crush or chew this medicine. Follow the directions on the prescription label. Take your medicine at regular intervals. Do not take it more often than directed. Do not stop taking except on your doctor's advice. Your doctor may want you to take a combination of 150 mg and 500 mg tablets for each dose. It is very important that you know how to correctly take your dose. Taking the wrong tablets could result in an overdose (too much medication) or underdose (too little medication). Talk to your pediatrician regarding the use of this medicine in children. Special care may be needed. Overdosage: If you think you have taken too much of this medicine contact a poison control center or emergency room at once. NOTE: This medicine is only for you. Do not share this medicine with others. What if I miss a dose? If you miss a dose, do not take the missed dose at all. Do not take double or extra doses. Instead, continue with your next scheduled dose and check with your doctor. What may interact with this medicine? -antacids with aluminum and/or magnesium -folic acid -leucovorin -medicines to increase blood counts like filgrastim, pegfilgrastim, sargramostim -phenytoin -vaccines -warfarin Talk to your doctor or health care professional before taking any of these medicines: -acetaminophen -aspirin -ibuprofen -ketoprofen -naproxen This list may not describe all possible interactions. Give your health care provider a list of all the medicines, herbs, non-prescription drugs, or dietary supplements you use. Also tell them if you smoke, drink alcohol, or use illegal drugs. Some items may interact with your medicine. What should I watch for while using this medicine? Visit your doctor for checks on your progress. This drug may make you feel generally unwell. This is not uncommon, as chemotherapy can affect healthy cells as well as cancer cells. Report any side effects. Continue your course of  treatment even though you feel ill unless your doctor tells you to stop. In some cases, you may be given additional medicines to help with side effects. Follow all directions for their use. Call your doctor or health care professional for advice if you get a fever, chills or sore throat, or other symptoms of a cold or flu. Do not treat yourself. This drug decreases your body's ability to fight infections. Try to avoid being around people who are sick. This medicine may increase your risk to bruise or bleed. Call your doctor or health care professional if you notice any unusual bleeding. Be careful brushing and flossing your teeth or using a toothpick because you may get an infection or bleed more easily. If you have any dental work done, tell your dentist you are receiving this medicine. Avoid taking products that contain aspirin, acetaminophen, ibuprofen, naproxen, or ketoprofen unless instructed by your doctor. These medicines may hide a  fever. Do not become pregnant while taking this medicine. Women should inform their doctor if they wish to become pregnant or think they might be pregnant. There is a potential for serious side effects to an unborn child. Talk to your health care professional or pharmacist for more information. Do not breast-feed an infant while taking this medicine. Men are advised not to father a child while taking this medicine. What side effects may I notice from receiving this medicine? Side effects that you should report to your doctor or health care professional as soon as possible: -allergic reactions like skin rash, itching or hives, swelling of the face, lips, or tongue -low blood counts - this medicine may decrease the number of white blood cells, red blood cells and platelets. You may be at increased risk for infections and bleeding. -signs of infection - fever or chills, cough, sore throat, pain or difficulty passing urine -signs of decreased platelets or bleeding -  bruising, pinpoint red spots on the skin, black, tarry stools, blood in the urine -signs of decreased red blood cells - unusually weak or tired, fainting spells, lightheadedness -breathing problems -changes in vision -chest pain -dark urine -diarrhea of more than 4 bowel movements in one day or any diarrhea at night; bloody or watery diarrhea -dizziness -mouth sores -nausea and vomiting -pain, swelling, redness at site where injected -pain, tingling, numbness in the hands or feet -redness, swelling, or sores on hands or feet -stomach pain -vomiting -yellow color of skin or eyes Side effects that usually do not require medical attention (report to your doctor or health care professional if they continue or are bothersome): -constipation -diarrhea -dry or itchy skin -hair loss -loss of appetite -nausea -weak or tired This list may not describe all possible side effects. Call your doctor for medical advice about side effects. You may report side effects to FDA at 1-800-FDA-1088. Where should I keep my medicine? Keep out of the reach of children. Store at room temperature between 15 and 30 degrees C (59 and 86 degrees F). Keep container tightly closed. Throw away any unused medicine after the expiration date. NOTE: This sheet is a summary. It may not cover all possible information. If you have questions about this medicine, talk to your doctor, pharmacist, or health care provider.  2015, [employer]. (2012-10-25 12:47:46)

## 2013-07-17 ENCOUNTER — Other Ambulatory Visit: Payer: Self-pay | Admitting: Internal Medicine

## 2013-07-17 ENCOUNTER — Other Ambulatory Visit: Payer: BC Managed Care – PPO

## 2013-07-17 ENCOUNTER — Encounter: Payer: BC Managed Care – PPO | Admitting: Nutrition

## 2013-07-17 ENCOUNTER — Telehealth: Payer: Self-pay | Admitting: Internal Medicine

## 2013-07-17 ENCOUNTER — Ambulatory Visit: Payer: BC Managed Care – PPO

## 2013-07-17 NOTE — Telephone Encounter (Signed)
Referral for Peg tube placement, lft msg with IR @[facility]  to call pt with apt......KJ

## 2013-07-18 ENCOUNTER — Other Ambulatory Visit: Payer: Self-pay | Admitting: Internal Medicine

## 2013-07-18 ENCOUNTER — Encounter (HOSPITAL_COMMUNITY): Payer: Self-pay | Admitting: Pharmacy Technician

## 2013-07-18 DIAGNOSIS — C155 Malignant neoplasm of lower third of esophagus: Secondary | ICD-10-CM

## 2013-07-18 MED ORDER — CAPECITABINE 500 MG PO TABS: ORAL_TABLET | ORAL | Status: DC

## 2013-07-18 MED ORDER — PROCHLORPERAZINE MALEATE 10 MG PO TABS: 10.0000 mg | ORAL_TABLET | Freq: Four times a day (QID) | ORAL | Status: DC | PRN

## 2013-07-18 MED ORDER — ONDANSETRON HCL 8 MG PO TABS: 8.0000 mg | ORAL_TABLET | Freq: Two times a day (BID) | ORAL | Status: DC

## 2013-07-18 NOTE — Progress Notes (Signed)
CORRECTION:  Capecitabine 625 mg per meter squared oral twice daily on days 1 through 21 every 21 days.

## 2013-07-21 ENCOUNTER — Other Ambulatory Visit: Payer: Self-pay | Admitting: Internal Medicine

## 2013-07-21 ENCOUNTER — Encounter: Payer: Self-pay | Admitting: Internal Medicine

## 2013-07-21 NOTE — Progress Notes (Signed)
Faxed xeloda prescription to [facility]

## 2013-07-22 ENCOUNTER — Other Ambulatory Visit: Payer: Self-pay | Admitting: Radiology

## 2013-07-22 ENCOUNTER — Telehealth: Payer: Self-pay | Admitting: *Deleted

## 2013-07-22 ENCOUNTER — Other Ambulatory Visit: Payer: BC Managed Care – PPO

## 2013-07-22 ENCOUNTER — Encounter: Payer: Self-pay | Admitting: *Deleted

## 2013-07-22 NOTE — Telephone Encounter (Signed)
No additional note

## 2013-07-23 ENCOUNTER — Ambulatory Visit: Payer: BC Managed Care – PPO

## 2013-07-24 ENCOUNTER — Ambulatory Visit: Payer: BC Managed Care – PPO

## 2013-07-24 ENCOUNTER — Encounter: Payer: BC Managed Care – PPO | Admitting: Nutrition

## 2013-07-24 ENCOUNTER — Other Ambulatory Visit: Payer: Self-pay | Admitting: Medical Oncology

## 2013-07-24 ENCOUNTER — Ambulatory Visit (HOSPITAL_COMMUNITY)
Admission: RE | Admit: 2013-07-24 | Discharge: 2013-07-24 | Disposition: A | Discharge status: Home / Self Care | Payer: BC Managed Care – PPO | Source: Ambulatory Visit | Attending: Internal Medicine | Admitting: Internal Medicine

## 2013-07-24 ENCOUNTER — Encounter (HOSPITAL_COMMUNITY): Payer: Self-pay

## 2013-07-24 DIAGNOSIS — C155 Malignant neoplasm of lower third of esophagus: Secondary | ICD-10-CM

## 2013-07-24 DIAGNOSIS — E46 Unspecified protein-calorie malnutrition: Secondary | ICD-10-CM | POA: Insufficient documentation

## 2013-07-24 DIAGNOSIS — Z7982 Long term (current) use of aspirin: Secondary | ICD-10-CM | POA: Insufficient documentation

## 2013-07-24 DIAGNOSIS — Z87891 Personal history of nicotine dependence: Secondary | ICD-10-CM | POA: Insufficient documentation

## 2013-07-24 DIAGNOSIS — R1310 Dysphagia, unspecified: Secondary | ICD-10-CM | POA: Insufficient documentation

## 2013-07-24 DIAGNOSIS — C160 Malignant neoplasm of cardia: Secondary | ICD-10-CM | POA: Insufficient documentation

## 2013-07-24 DIAGNOSIS — Z79899 Other long term (current) drug therapy: Secondary | ICD-10-CM | POA: Insufficient documentation

## 2013-07-24 LAB — CBC WITH DIFFERENTIAL/PLATELET
BASOS ABS: 0 10*3/uL (ref 0.0–0.1)
Basophils Relative: 0 % (ref 0–1)
Eosinophils Absolute: 0.1 10*3/uL (ref 0.0–0.7)
Eosinophils Relative: 1 % (ref 0–5)
HEMATOCRIT: 41.1 % (ref 39.0–52.0)
Hemoglobin: 13.9 g/dL (ref 13.0–17.0)
Lymphocytes Relative: 18 % (ref 12–46)
Lymphs Abs: 1.7 10*3/uL (ref 0.7–4.0)
MCH: 28.7 pg (ref 26.0–34.0)
MCHC: 33.8 g/dL (ref 30.0–36.0)
MCV: 84.9 fL (ref 78.0–100.0)
Monocytes Absolute: 1.3 10*3/uL — ABNORMAL HIGH (ref 0.1–1.0)
Monocytes Relative: 13 % — ABNORMAL HIGH (ref 3–12)
NEUTROS ABS: 6.6 10*3/uL (ref 1.7–7.7)
Neutrophils Relative %: 68 % (ref 43–77)
PLATELETS: 449 10*3/uL — AB (ref 150–400)
RBC: 4.84 MIL/uL (ref 4.22–5.81)
RDW: 13.3 % (ref 11.5–15.5)
WBC: 9.6 10*3/uL (ref 4.0–10.5)

## 2013-07-24 LAB — PROTIME-INR
INR: 1.01 (ref 0.00–1.49)
PROTHROMBIN TIME: 13.3 s (ref 11.6–15.2)

## 2013-07-24 LAB — APTT: APTT: 32 s (ref 24–37)

## 2013-07-24 MED ORDER — IOHEXOL 300 MG/ML  SOLN
25.0000 mL | Freq: Once | INTRAMUSCULAR | Status: AC | PRN
  Administered 2013-07-24: 10 mL

## 2013-07-24 MED ORDER — CEFAZOLIN SODIUM-DEXTROSE 2-3 GM-% IV SOLR
INTRAVENOUS | Status: AC
  Filled 2013-07-24: qty 50

## 2013-07-24 MED ORDER — GLUCAGON HCL RDNA (DIAGNOSTIC) 1 MG IJ SOLR
INTRAMUSCULAR | Status: AC
  Filled 2013-07-24: qty 1

## 2013-07-24 MED ORDER — CEFAZOLIN SODIUM-DEXTROSE 2-3 GM-% IV SOLR
2.0000 g | Freq: Once | INTRAVENOUS | Status: AC
  Administered 2013-07-24: 2 g via INTRAVENOUS

## 2013-07-24 MED ORDER — FENTANYL CITRATE 0.05 MG/ML IJ SOLN
INTRAMUSCULAR | Status: AC | PRN
  Administered 2013-07-24: 25 ug via INTRAVENOUS
  Administered 2013-07-24: 50 ug via INTRAVENOUS
  Administered 2013-07-24: 25 ug via INTRAVENOUS
  Administered 2013-07-24: 50 ug via INTRAVENOUS

## 2013-07-24 MED ORDER — HEPARIN SOD (PORK) LOCK FLUSH 100 UNIT/ML IV SOLN
500.0000 [IU] | Freq: Once | INTRAVENOUS | Status: AC
  Administered 2013-07-24: 500 [IU] via INTRAVENOUS
  Filled 2013-07-24: qty 5

## 2013-07-24 MED ORDER — MIDAZOLAM HCL 2 MG/2ML IJ SOLN
INTRAMUSCULAR | Status: AC | PRN
  Administered 2013-07-24: 1 mg via INTRAVENOUS
  Administered 2013-07-24: 0.5 mg via INTRAVENOUS

## 2013-07-24 MED ORDER — MIDAZOLAM HCL 2 MG/2ML IJ SOLN
INTRAMUSCULAR | Status: AC
  Filled 2013-07-24: qty 4

## 2013-07-24 MED ORDER — FENTANYL CITRATE 0.05 MG/ML IJ SOLN
INTRAMUSCULAR | Status: AC
  Filled 2013-07-24: qty 4

## 2013-07-24 MED ORDER — SODIUM CHLORIDE 0.9 % IV SOLN
INTRAVENOUS | Status: DC
  Administered 2013-07-24: 500 mL via INTRAVENOUS

## 2013-07-24 MED ORDER — OXYCODONE-ACETAMINOPHEN 5-325 MG PO TABS
1.0000 | ORAL_TABLET | ORAL | Status: DC | PRN
  Administered 2013-07-24: 1 via ORAL
  Filled 2013-07-24: qty 1

## 2013-07-24 MED ORDER — GLUCAGON HCL (RDNA) 1 MG IJ SOLR
INTRAMUSCULAR | Status: AC | PRN
  Administered 2013-07-24: 1 mg via INTRAVENOUS

## 2013-07-24 NOTE — Procedures (Signed)
Successful 72fr Gtube insertion No comp Stable Full report in PACS

## 2013-07-24 NOTE — H&P (Signed)
Devin Gonzalez is an 58 y.o. male.   Chief Complaint: "I'm here for a stomach tube" HPI: Patient with history of stage IV GE junction adenocarcinoma and dysphagia presents today for percutaneous gastrostomy tube placement.  Past Medical History  Diagnosis Date  . Allergy   . Hemorrhoids   . Back pain     right side  . Bunion, right foot   . Left tennis elbow   . Osteoarthritis     hands, knees, right hip  . Prostatitis   . Diverticulosis 2013  . Adenomatous colon polyp   . ED (erectile dysfunction)   . Tobacco abuse   . Reflux esophagitis   . Nonspecific elevation of levels of transaminase or lactic acid dehydrogenase (LDH)     Past Surgical History  Procedure Laterality Date  . Cyst on testicle Right   . Appendectomy    . Tonsillectomy    . Knee surgery Bilateral     arthroscopic surg /  bil knees  . Fatty tumor Right     removal forearm  . Colonoscopy w/ polypectomy    . Sphincterotomy  08/28/2011    Procedure: SPHINCTEROTOMY;  Surgeon: Joyice Faster. Cornett, MD;  Location: [place];  Service: General;  Laterality: N/A;  Lateral internal sphincterotomy  . Hemorrhoid surgery  08/28/2011    Procedure: HEMORRHOIDECTOMY;  Surgeon: Joyice Faster. Cornett, MD;  Location: [place];  Service: General;  Laterality: N/A;  ,possible hemorrhoidectomy  . Examination under anesthesia  12/13/2011    Procedure: EXAM UNDER ANESTHESIA;  Surgeon: Marcello Moores A. Cornett, MD;  Location: [place];  Service: General;  Laterality: N/A;  . Anal fissure repair  2013    Cornett    Family History  Problem Relation Age of Onset  . Lung cancer Mother   . Cancer Father   . Lung cancer Brother   . Stroke Brother    Social History:  reports that he quit smoking about 4 weeks ago. His smoking use included Cigarettes. He has a 10 pack-year smoking history. He has never used smokeless tobacco. He reports that he drinks alcohol. He reports that he uses illicit drugs.  Allergies: No Known Allergies  Current outpatient  prescriptions:aspirin-sod bicarb-citric acid (ALKA-SELTZER) 325 MG TBEF tablet, Take 325 mg by mouth every 6 (six) hours as needed (acid reflux)., Disp: , Rfl: ;  baclofen (LIORESAL) 10 MG tablet, Take 10 mg by mouth 3 (three) times daily., Disp: , Rfl: ;  DiphenhydrAMINE HCl (BENADRYL ALLERGY PO), Take 25 mg by mouth at bedtime. , Disp: , Rfl:  feeding supplement (BOOST HIGH PROTEIN) LIQD, Take 1 Container by mouth 3 (three) times daily between meals., Disp: , Rfl: ;  fluconazole (DIFLUCAN) 10 MG/ML suspension, Take 100 mg by mouth daily., Disp: , Rfl: ;  lidocaine (XYLOCAINE) 2 % solution, Use as directed 20 mLs in the mouth or throat 3 (three) times daily as needed for mouth pain., Disp: , Rfl:  lidocaine-prilocaine (EMLA) cream, Apply 1 application topically as needed (for port access)., Disp: , Rfl: ;  montelukast (SINGULAIR) 10 MG tablet, Take 10 mg by mouth daily. , Disp: , Rfl: ;  nicotine (NICODERM CQ - DOSED IN MG/24 HOURS) 14 mg/24hr patch, Place 14 mg onto the skin daily., Disp: , Rfl: ;  nystatin (MYCOSTATIN) 100000 UNIT/ML suspension, Take 5 mLs by mouth 4 (four) times daily., Disp: , Rfl:  OXYCODONE HCL PO, Take 5 mLs by mouth every 4 (four) hours as needed (severe pain)., Disp: , Rfl: ;  pantoprazole (PROTONIX) 40 MG tablet, Take 40 mg by mouth daily., Disp: , Rfl: ;  sucralfate (CARAFATE) 1 GM/10ML suspension, Take 1 g by mouth 4 (four) times daily -  with meals and at bedtime., Disp: , Rfl: ;  Tetrahydrozoline HCl (VISINE OP), Place 2-4 drops into both eyes 2 (two) times daily as needed. For dry eyes, Disp: , Rfl:  Current facility-administered medications:0.9 %  sodium chloride infusion, , Intravenous, Continuous, Ascencion Dike, PA-C, Last Rate: 50 mL/hr at 07/24/13 1220, 500 mL at 07/24/13 1220;  ceFAZolin (ANCEF) IVPB 2 g/50 mL premix, 2 g, Intravenous, Once, Ascencion Dike, PA-C  No results found for this or any previous visit (from the past 48 hour(s)). No results found.  Review of  Systems  Constitutional: Positive for weight loss and malaise/fatigue. Negative for fever and chills.  HENT: Positive for hearing loss.   Respiratory: Negative for hemoptysis.        Occ cough and dyspnea  Cardiovascular:       Hx esophageal spasms/ant chest discomfort  Gastrointestinal: Positive for heartburn. Negative for vomiting, abdominal pain and blood in stool.       Occ nausea  Genitourinary: Negative for hematuria.  Musculoskeletal: Positive for back pain.  Neurological: Positive for weakness and headaches.    Blood pressure 166/83, pulse 80, temperature 98.5 F (36.9 C), temperature source Oral, resp. rate 18, height 5\' 4"  (1.626 m), weight 105 lb (47.628 kg). Physical Exam  Constitutional: He is oriented to person, place, and time.  Thin BM c/o esophageal spasms  Cardiovascular: Normal rate and regular rhythm.   Respiratory: Effort normal and breath sounds normal.  Clean, intact rt chest wall PAC; tenderness noted along sternum  GI: Soft. Bowel sounds are normal.  Musculoskeletal: Normal range of motion. He exhibits no edema.  Neurological: He is alert and oriented to person, place, and time.     Assessment/Plan  Patient with history of stage IV GE junction adenocarcinoma and dysphagia presents today for percutaneous gastrostomy tube placement. Details/risks of procedure d/w pt with his understanding and consent.  Leeasia Secrist,D KEVIN 07/24/2013, 12:25 PM

## 2013-07-24 NOTE — Discharge Instructions (Signed)
Gastrostomy Tube Home Guide A gastrostomy tube is a tube that is surgically placed into the stomach. It is also called a "G-tube." G-tubes are used when a person is unable to eat and drink enough on their own to stay healthy. The tube is inserted into the stomach through a small cut (incision) in the skin. This tube is used for:  Feeding.  Giving medication. GASTROSTOMY TUBE CARE  Wash your hands with soap and water.  Remove the old dressing (if any). Some styles of G-tubes may need a dressing inserted between the skin and the G-tube. Other types of G-tubes do not require a dressing. Ask your health care provider if a dressing is needed.  Check the area where the tube enters the skin (insertion site) for redness, swelling, or pus-like (purulent) drainage. A small amount of clear or tan liquid drainage is normal. Check to make sure scar tissue (skin) is not growing around the insertion site. This could have a raised, bumpy appearance.  A cotton swab can be used to clean the skin around the tube:  When the G-tube is first put in, a normal saline solution or water can be used to clean the skin.  Mild soap and warm water can be used when the skin around the G-tube site has healed.  Roll the cotton swab around the G-tube insertion site to remove any drainage or crusting at the insertion site. STOMACH RESIDUALS Feeding tube residuals are the amount of liquids that are in the stomach at any given time. Residuals may be checked before giving feedings, medications, or as instructed by your health care provider.  Ask your health care provider if there are instances when you would not start tube feedings depending on the amount or type of contents withdrawn from the stomach.  Check residuals by attaching a syringe to the G-tube and pulling back on the syringe plunger. Note the amount, and return the residual back into the stomach. FLUSHING THE G-TUBE  The G-tube should be periodically flushed with  clean warm water to keep it from clogging.  Flush the G-tube after feedings or medications. Draw up 30 mL of warm water in a syringe. Connect the syringe to the G-tube and slowly push the water into the tube.  Do not push feedings, medications, or flushes rapidly. Flush the G-tube gently and slowly.  Only use syringes made for G-tubes to flush medications or feedings.  Your health care provider may want the G-tube flushed more often or with more water. If this is the case, follow your health care provider's instructions. FEEDINGS Your health care provider will determine whether feedings are given as a bolus (a certain amount given at one time and at scheduled times) or whether feedings will be given continuously on a feeding pump.   Formulas should be given at room temperature.  If feedings are continuous, no more than 4 hours worth of feedings should be placed in the feeding bag. This helps prevent spoilage or accidental excess infusion.  Cover and place unused formula in the refrigerator.  If feedings are continuous, stop the feedings when medications or flushes are given. Be sure to restart the feedings.  Feeding bags and syringes should be replaced as instructed by your health care provider. GIVING MEDICATION   In general, it is best if all medications are in a liquid form for G-tube administration. Liquid medications are less likely to clog the G-tube.  Mix the liquid medication with 30 mL (or amount recommended by your  health care provider) of warm water.  Draw up the medication into the syringe.  Attach the syringe to the G-tube and slowly push the mixture into the G-tube.  After giving the medication, draw up 30 mL of warm water in the syringe and slowly flush the G-tube.  For pills or capsules, check with your health care provider first before crushing medications. Some pills are not effective if they are crushed. Some capsules are sustained-release medications.  If  appropriate, crush the pill or capsule and mix with 30 mL of warm water. Using the syringe, slowly push the medication through the tube, then flush the tube with another 30 mL of tap water. G-TUBE PROBLEMS G-tube was pulled out.  Cause: May have been pulled out accidentally.  Solutions: Cover the opening with clean dressing and tape. Call your health care provider right away. The G-tube should be put in as soon as possible (within 4 hours) so the G-tube opening (tract) does not close. The G-tube needs to be put in at a health care setting. An X-ray needs to be done to confirm placement before the G-tube can be used again. Redness, irritation, soreness, or foul odor around the gastrostomy site.  Cause: May be caused by leakage or infection.  Solutions: Call your health care provider right away. Large amount of leakage of fluid or mucus-like liquid present (a large amount means it soaks clothing).  Cause: Many reasons could cause the G-tube to leak.  Solutions: Call your health care provider to discuss the amount of leakage. Skin or scar tissue appears to be growing where tube enters skin.   Cause: Tissue growth may develop around the insertion site if the G-tube is moved or pulled on excessively.  Solutions: Secure tube with tape so that excess movement does not occur. Call your health care provider. G-tube is clogged.  Cause: Thick formula or medication.  Solutions: Try to slowly push warm water into the tube with a large syringe. Never try to push any object into the tube to unclog it. Do not force fluid into the G-tube. If you are unable to unclog the tube, call your health care provider right away. TIPS  Head of bed (HOB) position refers to the upright position of a person's upper body.  When giving medications or a feeding bolus, keep the [facility] up as told by your health care provider. Do this during the feeding and for 1 hour after the feeding or medication administration.  If  continuous feedings are being given, it is best to keep the [facility] up as told by your health care provider. When ADLs (activities of daily living) are performed and the [facility] needs to be flat, be sure to turn the feeding pump off. Restart the feeding pump when the [facility] is returned to the recommended height.  Do not pull or put tension on the tube.  To prevent fluid backflow, kink the G-tube before removing the cap or disconnecting a syringe.  Check the G-tube length every day. Measure from the insertion site to the end of the G-tube. If the length is longer than previous measurements, the tube may be coming out. Call your health care provider if you notice increasing G-tube length.  Oral care, such as brushing teeth, must be continued.  You may need to remove excess air (vent) from the G-tube. Your health care provider will tell you if this is needed.  Always call your health care provider if you have questions or problems with the G-tube.  SEEK IMMEDIATE MEDICAL CARE IF:   You have severe abdominal pain, tenderness, or abdominal bloating (distension).  You have nausea or vomiting.  You are constipated or have problems moving your bowels.  The G-tube insertion site is red, swollen, has a foul smell, or has yellow or brown drainage.  You have difficulty breathing or shortness of breath.  You have a fever.  You have a large amount of feeding tube residuals.  The G-tube is clogged and cannot be flushed. MAKE SURE YOU:   Understand these instructions.  Will watch your condition.  Will get help right away if you are not doing well or get worse. Document Released: 02/27/2001 Document Revised: 05/05/2013 Document Reviewed: 08/26/2012 [facility] Patient Information 2015 [place], [place]. This information is not intended to replace advice given to you by your health care provider. Make sure you discuss any questions you have with your health care provider. Conscious Sedation, Adult, Care  After Refer to this sheet in the next few weeks. These instructions provide you with information on caring for yourself after your procedure. Your health care provider may also give you more specific instructions. Your treatment has been planned according to current medical practices, but problems sometimes occur. Call your health care provider if you have any problems or questions after your procedure. WHAT TO EXPECT AFTER THE PROCEDURE  After your procedure:  You may feel sleepy, clumsy, and have poor balance for several hours.  Vomiting may occur if you eat too soon after the procedure. HOME CARE INSTRUCTIONS  Do not participate in any activities where you could become injured for at least 24 hours. Do not:  Drive.  Swim.  Ride a bicycle.  Operate heavy machinery.  Cook.  Use power tools.  Climb ladders.  Work from a high place.  Do not make important decisions or sign legal documents until you are improved.  If you vomit, drink water, juice, or soup when you can drink without vomiting. Make sure you have little or no nausea before eating solid foods.  Only take over-the-counter or prescription medicines for pain, discomfort, or fever as directed by your health care provider.  Make sure you and your family fully understand everything about the medicines given to you, including what side effects may occur.  You should not drink alcohol, take sleeping pills, or take medicines that cause drowsiness for at least 24 hours.  If you smoke, do not smoke without supervision.  If you are feeling better, you may resume normal activities 24 hours after you were sedated.  Keep all appointments with your health care provider. SEEK MEDICAL CARE IF:  Your skin is pale or bluish in color.  You continue to feel nauseous or vomit.  Your pain is getting worse and is not helped by medicine.  You have bleeding or swelling.  You are still sleepy or feeling clumsy after 24  hours. SEEK IMMEDIATE MEDICAL CARE IF:  You develop a rash.  You have difficulty breathing.  You develop any type of allergic problem.  You have a fever. MAKE SURE YOU:  Understand these instructions.  Will watch your condition.  Will get help right away if you are not doing well or get worse. Document Released: 10/09/2012 Document Reviewed: 10/09/2012 [facility] Patient Information 2015 [place], [place]. This information is not intended to replace advice given to you by your health care provider. Make sure you discuss any questions you have with your health care provider.

## 2013-07-25 NOTE — Progress Notes (Signed)
[facility] End of Treatment Note  Name:Micajah JACE FERMIN  Date: 06/23/2013 OEH:[id number] DOB:[birth year]   Status:outpatient    DIAGNOSIS: STAGE IV ESOPHAGEAL CANCER    INDICATION FOR TREATMENT: Palliative   TREATMENT DATES: 6-9 to 06-23-13                          SITE/DOSE:   Distal Esophagus / 30 Gy in 10 fractions                   BEAMS/ENERGY:     3D conformal / 15 and 6 MV              NARRATIVE:  Swallowing is better, but GERD still painful. MRI of brain for vision changes showed asymptomatic 70mm lesion.   PLAN: Rx ranitidine for heartburn. Discussed MRI brain results. 29mm lesion is asymptomatic - I think that it is not urgent to treat now. Proceed with chemo, rescan brain in ~ 6 wks, and at that time consider SRS to that lesion +/- any new lesions that may appear in the interim. Alternative would be treatment in near future. Pt comfortable waiting until 6wk scan. He knows to call if any issues arise before then.                        -----------------------------------  Eppie Gibson, MD

## 2013-07-29 ENCOUNTER — Encounter: Payer: Self-pay | Admitting: Radiation Oncology

## 2013-07-30 ENCOUNTER — Ambulatory Visit (HOSPITAL_BASED_OUTPATIENT_CLINIC_OR_DEPARTMENT_OTHER): Payer: BC Managed Care – PPO | Admitting: Internal Medicine

## 2013-07-30 ENCOUNTER — Other Ambulatory Visit (HOSPITAL_BASED_OUTPATIENT_CLINIC_OR_DEPARTMENT_OTHER): Payer: BC Managed Care – PPO

## 2013-07-30 ENCOUNTER — Telehealth: Payer: Self-pay

## 2013-07-30 ENCOUNTER — Ambulatory Visit: Payer: BC Managed Care – PPO

## 2013-07-30 ENCOUNTER — Other Ambulatory Visit: Payer: Self-pay | Admitting: *Deleted

## 2013-07-30 ENCOUNTER — Encounter: Payer: Self-pay | Admitting: Radiation Oncology

## 2013-07-30 VITALS — BP 115/70 | HR 84 | Temp 98.3°F | Resp 18 | Ht 64.0 in | Wt 93.9 lb

## 2013-07-30 DIAGNOSIS — G893 Neoplasm related pain (acute) (chronic): Secondary | ICD-10-CM

## 2013-07-30 DIAGNOSIS — C7951 Secondary malignant neoplasm of bone: Secondary | ICD-10-CM

## 2013-07-30 DIAGNOSIS — R630 Anorexia: Secondary | ICD-10-CM

## 2013-07-30 DIAGNOSIS — E43 Unspecified severe protein-calorie malnutrition: Secondary | ICD-10-CM

## 2013-07-30 DIAGNOSIS — C155 Malignant neoplasm of lower third of esophagus: Secondary | ICD-10-CM

## 2013-07-30 DIAGNOSIS — C7931 Secondary malignant neoplasm of brain: Secondary | ICD-10-CM

## 2013-07-30 DIAGNOSIS — H9190 Unspecified hearing loss, unspecified ear: Secondary | ICD-10-CM

## 2013-07-30 DIAGNOSIS — H9193 Unspecified hearing loss, bilateral: Secondary | ICD-10-CM

## 2013-07-30 DIAGNOSIS — C7949 Secondary malignant neoplasm of other parts of nervous system: Secondary | ICD-10-CM

## 2013-07-30 DIAGNOSIS — C7952 Secondary malignant neoplasm of bone marrow: Secondary | ICD-10-CM

## 2013-07-30 DIAGNOSIS — E860 Dehydration: Secondary | ICD-10-CM

## 2013-07-30 LAB — COMPREHENSIVE METABOLIC PANEL (CC13)
ALBUMIN: 3.3 g/dL — AB (ref 3.5–5.0)
ALK PHOS: 71 U/L (ref 40–150)
ALT: 18 U/L (ref 0–55)
AST: 32 U/L (ref 5–34)
Anion Gap: 11 mEq/L (ref 3–11)
BUN: 15.9 mg/dL (ref 7.0–26.0)
CALCIUM: 9.6 mg/dL (ref 8.4–10.4)
CHLORIDE: 104 meq/L (ref 98–109)
CO2: 30 mEq/L — ABNORMAL HIGH (ref 22–29)
Creatinine: 1.4 mg/dL — ABNORMAL HIGH (ref 0.7–1.3)
Glucose: 119 mg/dl (ref 70–140)
POTASSIUM: 3.9 meq/L (ref 3.5–5.1)
SODIUM: 145 meq/L (ref 136–145)
TOTAL PROTEIN: 6.9 g/dL (ref 6.4–8.3)
Total Bilirubin: 0.36 mg/dL (ref 0.20–1.20)

## 2013-07-30 LAB — CBC WITH DIFFERENTIAL/PLATELET
BASO%: 0.7 % (ref 0.0–2.0)
Basophils Absolute: 0.1 10*3/uL (ref 0.0–0.1)
EOS%: 3.5 % (ref 0.0–7.0)
Eosinophils Absolute: 0.4 10*3/uL (ref 0.0–0.5)
HCT: 38.5 % (ref 38.4–49.9)
HEMOGLOBIN: 12.2 g/dL — AB (ref 13.0–17.1)
LYMPH#: 1.9 10*3/uL (ref 0.9–3.3)
LYMPH%: 18.8 % (ref 14.0–49.0)
MCH: 28.4 pg (ref 27.2–33.4)
MCHC: 31.8 g/dL — ABNORMAL LOW (ref 32.0–36.0)
MCV: 89.3 fL (ref 79.3–98.0)
MONO#: 1.3 10*3/uL — AB (ref 0.1–0.9)
MONO%: 13.1 % (ref 0.0–14.0)
NEUT#: 6.5 10*3/uL (ref 1.5–6.5)
NEUT%: 63.9 % (ref 39.0–75.0)
Platelets: 344 10*3/uL (ref 140–400)
RBC: 4.31 10*6/uL (ref 4.20–5.82)
RDW: 13.4 % (ref 11.0–14.6)
WBC: 10.1 10*3/uL (ref 4.0–10.3)

## 2013-07-30 LAB — LACTATE DEHYDROGENASE (CC13): LDH: 149 U/L (ref 125–245)

## 2013-07-30 MED ORDER — CAPECITABINE 500 MG PO TABS: 500.0000 mg | ORAL_TABLET | Freq: Two times a day (BID) | ORAL | Status: AC

## 2013-07-30 MED ORDER — OXYCODONE HCL 10 MG PO TABS: 10.0000 mg | ORAL_TABLET | Freq: Three times a day (TID) | ORAL | Status: DC | PRN

## 2013-07-30 MED ORDER — SODIUM CHLORIDE 0.9 % IV SOLN: Freq: Once | INTRAVENOUS | Status: DC

## 2013-07-30 MED ORDER — DRONABINOL 2.5 MG PO CAPS: 2.5000 mg | ORAL_CAPSULE | Freq: Two times a day (BID) | ORAL | Status: AC

## 2013-07-30 NOTE — Progress Notes (Signed)
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   Patient presented with symptoms of:  Assymptomatic  Past or anticipated interventions, if any, per neurosurgery: No encounter  Past or anticipated interventions, if any, per medical oncology: Dr. Concha Norway.  Past chemotherapy with Fluorouracil and to start Capecitabine 500 mg BID on 08/06/13  Dose of Decadron, if applicable: NO  Recent neurologic symptoms, if any:   Seizures: No  Headaches: No  Nausea: No  Dizziness/ataxia: No  Difficulty with hand coordination: No  Focal numbness/weakness: No  Visual deficits/changes: No  Confusion/Memory deficits: No  Painful bone metastases at present, if any: No  SAFETY ISSUES:  Prior radiation? Distal Esophagus / 30 Gy in 10 fractions on 06-10-13 to 06-23-13                                                                                                                 Pacemaker/ICD? No  Possible current pregnancy? N/A  Is the patient on methotrexate? No  Additional Complaints / other details:

## 2013-07-30 NOTE — Telephone Encounter (Signed)
Faxed xeloda Rx to [facility] outpatient pharmacy per [facility].

## 2013-07-30 NOTE — Patient Instructions (Signed)
Dronabinol, THC capsules What is this medicine? DRONABINOL (droe NAB i nol) is used to treat nausea and vomiting caused by cancer treatment, especially for those patients who do not respond to other medicines. This medicine is also used to increase appetite in AIDS patients. This medicine may be used for other purposes; ask your health care provider or pharmacist if you have questions. COMMON BRAND NAME(S): Marinol What should I tell my health care provider before I take this medicine? They need to know if you have any of these conditions: -a history of drug or alcohol abuse -heart disease, including angina or irregular heart rate -high or low blood pressure -dizziness or fainting spells on standing -mental health problems (schizophrenia, mania, depression) -an unusual or allergic reaction to dronabinol, marijuana, sesame oil, other medicines, foods, dyes, or preservatives -pregnant or trying to get pregnant -breast-feeding How should I use this medicine? Take this medicine by mouth. Follow the directions on the prescription label. Take your doses at regular intervals. Do not take your medicine more often than directed. Talk to your pediatrician regarding the use of this medicine in children. Special care may be needed. Overdosage: If you think you have taken too much of this medicine contact a poison control center or emergency room at once. NOTE: This medicine is only for you. Do not share this medicine with others. What if I miss a dose? If you miss a dose, take it as soon as you can. If it is almost time for your next dose, take only that dose. Do not take double or extra doses. What may interact with this medicine? Do not take this medicine with any of the following medications: -nabilone This medicine may also interact with the following medications: -alcohol-containing medicines or drinks -amphetamine or other stimulant drugs -barbiturates such as phenobarbital -certain  antidepressants or tranquilizers -certain antihistamines used in cold medicines -cocaine -disulfiram -muscle relaxants -theophylline This list may not describe all possible interactions. Give your health care provider a list of all the medicines, herbs, non-prescription drugs, or dietary supplements you use. Also tell them if you smoke, drink alcohol, or use illegal drugs. Some items may interact with your medicine. What should I watch for while using this medicine? The first time you take this medicine or have an increase in dose make sure there is a responsible person nearby. You may experience mood changes, easy laughter, or other changes in behavior. You may get drowsy or dizzy. Do not drive, use machinery, or do anything that needs mental alertness until you know how this medicine affects you. Do not stand or sit up quickly, especially if you have low blood pressure or if you are an older patient. This reduces the risk of dizzy or fainting spells. Alcohol may interfere with the effect of this medicine. Avoid alcoholic drinks. If you are taking this medicine to improve your appetite, this is only part of your therapy. Discuss what you can eat and ways of improving your diet with your health care professional or nutritionist. Frequent small snacks as well as regular meals can help provide extra calories and protein. Do not smoke marijuana while you are taking this medicine. It is similar to one of the active substances found in marijuana. You are at increased risk of serious heart and/or nervous system side effects if these drugs are used together. What side effects may I notice from receiving this medicine? Side effects that you should report to your doctor or health care professional as soon as  possible: -allergic reactions like skin rash, itching or hives, swelling of the face, lips, or tongue -fast, irregular heartbeat -feeling faint or lightheaded, falls -hallucinations -panic  reactions -slurred speech Side effects that usually do not require medical attention (report to your doctor or health care professional if they continue or are bothersome): -anxiety or nervousness -confusion -nausea, vomiting or diarrhea -stomach upset This list may not describe all possible side effects. Call your doctor for medical advice about side effects. You may report side effects to FDA at 1-800-FDA-1088. Where should I keep my medicine? Keep out of the reach of children. This medicine can be abused. Keep your medicine in a safe place to protect it from theft. Do not share this medicine with anyone. Selling or giving away this medicine is dangerous and against the law. Store in a cool place between 8 and 15 degrees C (46 and 59 degrees F) or in a refrigerator. Avoid freezing. Throw away any unused medicine after the expiration date. NOTE: This sheet is a summary. It may not cover all possible information. If you have questions about this medicine, talk to your doctor, pharmacist, or health care provider.  2015, [employer]. (2007-03-06 14:11:58) Oxycodone tablets or capsules What is this medicine? OXYCODONE (ox i KOE done) is a pain reliever. It is used to treat moderate to severe pain. This medicine may be used for other purposes; ask your health care provider or pharmacist if you have questions. COMMON BRAND NAME(S): Dazidox, Endocodone, OXECTA, OxyIR, Percolone, Roxicodone What should I tell my health care provider before I take this medicine? They need to know if you have any of these conditions: -Addison's disease -brain tumor -drug abuse or addiction -head injury -heart disease -if you frequently drink alcohol containing drinks -kidney disease or problems going to the bathroom -liver disease -lung disease, asthma, or breathing problems -mental problems -an unusual or allergic reaction to oxycodone, codeine, hydrocodone, morphine, other medicines, foods, dyes,  or preservatives -pregnant or trying to get pregnant -breast-feeding How should I use this medicine? Take this medicine by mouth with a glass of water. Follow the directions on the prescription label. You can take it with or without food. If it upsets your stomach, take it with food. Take your medicine at regular intervals. Do not take it more often than directed. Do not stop taking except on your doctor's advice. Some brands of this medicine, like Oxecta, have special instructions. Ask your doctor or pharmacist if these directions are for you: Do not cut, crush or chew this medicine. Swallow only one tablet at a time. Do not wet, soak, or lick the tablet before you take it. Talk to your pediatrician regarding the use of this medicine in children. Special care may be needed. Overdosage: If you think you have taken too much of this medicine contact a poison control center or emergency room at once. NOTE: This medicine is only for you. Do not share this medicine with others. What if I miss a dose? If you miss a dose, take it as soon as you can. If it is almost time for your next dose, take only that dose. Do not take double or extra doses. What may interact with this medicine? -alcohol -antihistamines -certain medicines used for nausea like chlorpromazine, droperidol -erythromycin -ketoconazole -medicines for depression, anxiety, or psychotic disturbances -medicines for sleep -muscle relaxants -naloxone -naltrexone -narcotic medicines (opiates) for pain -nilotinib -phenobarbital -phenytoin -rifampin -ritonavir -voriconazole This list may not describe all possible interactions. Give your health  care provider a list of all the medicines, herbs, non-prescription drugs, or dietary supplements you use. Also tell them if you smoke, drink alcohol, or use illegal drugs. Some items may interact with your medicine. What should I watch for while using this medicine? Tell your doctor or health care  professional if your pain does not go away, if it gets worse, or if you have new or a different type of pain. You may develop tolerance to the medicine. Tolerance means that you will need a higher dose of the medicine for pain relief. Tolerance is normal and is expected if you take this medicine for a long time. Do not suddenly stop taking your medicine because you may develop a severe reaction. Your body becomes used to the medicine. This does NOT mean you are addicted. Addiction is a behavior related to getting and using a drug for a non-medical reason. If you have pain, you have a medical reason to take pain medicine. Your doctor will tell you how much medicine to take. If your doctor wants you to stop the medicine, the dose will be slowly lowered over time to avoid any side effects. You may get drowsy or dizzy when you first start taking this medicine or change doses. Do not drive, use machinery, or do anything that may be dangerous until you know how the medicine affects you. Stand or sit up slowly. There are different types of narcotic medicines (opiates) for pain. If you take more than one type at the same time, you may have more side effects. Give your health care provider a list of all medicines you use. Your doctor will tell you how much medicine to take. Do not take more medicine than directed. Call emergency for help if you have problems breathing. This medicine will cause constipation. Try to have a bowel movement at least every 2 to 3 days. If you do not have a bowel movement for 3 days, call your doctor or health care professional. Your mouth may get dry. Drinking water, chewing sugarless gum, or sucking on hard candy may help. See your dentist every 6 months. What side effects may I notice from receiving this medicine? Side effects that you should report to your doctor or health care professional as soon as possible: -allergic reactions like skin rash, itching or hives, swelling of the face,  lips, or tongue -breathing problems -confusion -feeling faint or lightheaded, falls -trouble passing urine or change in the amount of urine -unusually weak or tired Side effects that usually do not require medical attention (report to your doctor or health care professional if they continue or are bothersome): -constipation -dry mouth -itching -nausea, vomiting -upset stomach This list may not describe all possible side effects. Call your doctor for medical advice about side effects. You may report side effects to FDA at 1-800-FDA-1088. Where should I keep my medicine? Keep out of the reach of children. This medicine can be abused. Keep your medicine in a safe place to protect it from theft. Do not share this medicine with anyone. Selling or giving away this medicine is dangerous and against the law. Store at room temperature between 15 and 30 degrees C (59 and 86 degrees F). Protect from light. Keep container tightly closed. This medicine may cause accidental overdose and death if it is taken by other adults, children, or pets. Flush any unused medicine down the toilet to reduce the chance of harm. Do not use the medicine after the expiration date. NOTE: This  sheet is a summary. It may not cover all possible information. If you have questions about this medicine, talk to your doctor, pharmacist, or health care provider.  2015, [employer] Standard. (2012-08-29 13:43:33)

## 2013-07-31 ENCOUNTER — Ambulatory Visit: Payer: BC Managed Care – PPO | Admitting: Nutrition

## 2013-07-31 ENCOUNTER — Other Ambulatory Visit: Payer: Self-pay

## 2013-07-31 ENCOUNTER — Telehealth: Payer: Self-pay | Admitting: *Deleted

## 2013-07-31 ENCOUNTER — Telehealth: Payer: Self-pay | Admitting: Physician Assistant

## 2013-07-31 ENCOUNTER — Telehealth: Payer: Self-pay

## 2013-07-31 ENCOUNTER — Ambulatory Visit (HOSPITAL_BASED_OUTPATIENT_CLINIC_OR_DEPARTMENT_OTHER): Payer: BC Managed Care – PPO

## 2013-07-31 ENCOUNTER — Other Ambulatory Visit: Payer: Self-pay | Admitting: *Deleted

## 2013-07-31 ENCOUNTER — Other Ambulatory Visit: Payer: Self-pay | Admitting: Internal Medicine

## 2013-07-31 ENCOUNTER — Encounter: Payer: Self-pay | Admitting: Internal Medicine

## 2013-07-31 VITALS — BP 112/66 | HR 72 | Temp 98.1°F

## 2013-07-31 DIAGNOSIS — C155 Malignant neoplasm of lower third of esophagus: Secondary | ICD-10-CM

## 2013-07-31 DIAGNOSIS — R638 Other symptoms and signs concerning food and fluid intake: Secondary | ICD-10-CM

## 2013-07-31 DIAGNOSIS — E43 Unspecified severe protein-calorie malnutrition: Secondary | ICD-10-CM

## 2013-07-31 DIAGNOSIS — E860 Dehydration: Secondary | ICD-10-CM

## 2013-07-31 MED ORDER — SODIUM CHLORIDE 0.9 % IJ SOLN
10.0000 mL | INTRAMUSCULAR | Status: DC | PRN
  Administered 2013-07-31: 10 mL via INTRAVENOUS
  Filled 2013-07-31: qty 10

## 2013-07-31 MED ORDER — HEPARIN SOD (PORK) LOCK FLUSH 100 UNIT/ML IV SOLN
500.0000 [IU] | Freq: Once | INTRAVENOUS | Status: AC
  Administered 2013-07-31: 500 [IU] via INTRAVENOUS
  Filled 2013-07-31: qty 5

## 2013-07-31 MED ORDER — OSMOLITE 1.5 CAL PO LIQD: ORAL | Status: AC

## 2013-07-31 MED ORDER — LORAZEPAM 0.5 MG PO TABS: 0.5000 mg | ORAL_TABLET | Freq: Four times a day (QID) | ORAL | Status: DC | PRN

## 2013-07-31 MED ORDER — SODIUM CHLORIDE 0.9 % IV SOLN
INTRAVENOUS | Status: AC
  Administered 2013-07-31: 15:00:00 via INTRAVENOUS

## 2013-07-31 NOTE — Telephone Encounter (Signed)
Rx for xeloda forwarded to [facility]. Pt will need to use [facility].

## 2013-07-31 NOTE — Progress Notes (Signed)
Faxed xeloda prescription to [facility]

## 2013-07-31 NOTE — Telephone Encounter (Signed)
Per [facility] staff message scheduled appts. Advised scheduler 

## 2013-07-31 NOTE — Telephone Encounter (Signed)
Pt confirmed labs/ov per 07/30 [facility], gave pt AVS....KJ °

## 2013-07-31 NOTE — Patient Instructions (Signed)
Dehydration, Adult Dehydration is when you lose more fluids from the body than you take in. Vital organs like the kidneys, brain, and heart cannot function without a proper amount of fluids and salt. Any loss of fluids from the body can cause dehydration.  CAUSES   Vomiting.  Diarrhea.  Excessive sweating.  Excessive urine output.  Fever. SYMPTOMS  Mild dehydration  Thirst.  Dry lips.  Slightly dry mouth. Moderate dehydration  Very dry mouth.  Sunken eyes.  Skin does not bounce back quickly when lightly pinched and released.  Dark urine and decreased urine production.  Decreased tear production.  Headache. Severe dehydration  Very dry mouth.  Extreme thirst.  Rapid, weak pulse (more than 100 beats per minute at rest).  Cold hands and feet.  Not able to sweat in spite of heat and temperature.  Rapid breathing.  Blue lips.  Confusion and lethargy.  Difficulty being awakened.  Minimal urine production.  No tears. DIAGNOSIS  Your caregiver will diagnose dehydration based on your symptoms and your exam. Blood and urine tests will help confirm the diagnosis. The diagnostic evaluation should also identify the cause of dehydration. TREATMENT  Treatment of mild or moderate dehydration can often be done at home by increasing the amount of fluids that you drink. It is best to drink small amounts of fluid more often. Drinking too much at one time can make vomiting worse. Refer to the home care instructions below. Severe dehydration needs to be treated at the hospital where you will probably be given intravenous (IV) fluids that contain water and electrolytes. HOME CARE INSTRUCTIONS   Ask your caregiver about specific rehydration instructions.  Drink enough fluids to keep your urine clear or pale yellow.  Drink small amounts frequently if you have nausea and vomiting.  Eat as you normally do.  Avoid:  Foods or drinks high in sugar.  Carbonated  drinks.  Juice.  Extremely hot or cold fluids.  Drinks with caffeine.  Fatty, greasy foods.  Alcohol.  Tobacco.  Overeating.  Gelatin desserts.  Wash your hands well to avoid spreading bacteria and viruses.  Only take over-the-counter or prescription medicines for pain, discomfort, or fever as directed by your caregiver.  Ask your caregiver if you should continue all prescribed and over-the-counter medicines.  Keep all follow-up appointments with your caregiver. SEEK MEDICAL CARE IF:  You have abdominal pain and it increases or stays in one area (localizes).  You have a rash, stiff neck, or severe headache.  You are irritable, sleepy, or difficult to awaken.  You are weak, dizzy, or extremely thirsty. SEEK IMMEDIATE MEDICAL CARE IF:   You are unable to keep fluids down or you get worse despite treatment.  You have frequent episodes of vomiting or diarrhea.  You have blood or green matter (bile) in your vomit.  You have blood in your stool or your stool looks black and tarry.  You have not urinated in 6 to 8 hours, or you have only urinated a small amount of very dark urine.  You have a fever.  You faint. MAKE SURE YOU:   Understand these instructions.  Will watch your condition.  Will get help right away if you are not doing well or get worse. Document Released: 12/19/2004 Document Revised: 03/13/2011 Document Reviewed: 08/08/2010 Exit[employer] Patient Information 2015 [employer]. This information is not intended to replace advice given to you by your health care provider. Make sure you discuss any questions you have with your health care   provider.  

## 2013-07-31 NOTE — Progress Notes (Signed)
[facility] OFFICE PROGRESS NOTE  [name], MD [place] [address]  DIAGNOSIS: Anorexia - Plan: 0.9 %  sodium chloride infusion, capecitabine (XELODA) 500 MG tablet  Malignant neoplasm of lower third of esophagus - Plan: 0.9 %  sodium chloride infusion, CBC with Differential, Basic metabolic panel (Bmet) - [facility], Magnesium - [facility], capecitabine (XELODA) 500 MG tablet  Dehydration - Plan: 0.9 %  sodium chloride infusion, capecitabine (XELODA) 500 MG tablet  Protein-calorie malnutrition, severe  Hearing loss, bilateral  Cancer related pain  Brain metastasis  Bone metastases  Chief Complaint  Patient presents with  . Follow-up    PRIOR TREATMENT:  He started palliative XRT to esophageal mass on 06/09/2013 per Dr. Eppie Gibson. It ended on 06/23/2013 s/p 10 treatments. He then started (ECF) consisting of  Epirubicin 50 mg/m2 IV, Cisplatin 60 mg/m2 IV on day #1, Fluorouracil 200 mg/m2 per day IV continuous infusion over 24 hours daily on days 1-21 cycled every 21 days for six months on 06/27/2013.  He tolerated part of his first cycle but was hospitalized due to worsening esophagitis and had notable hearing lost.    He takes that protonix once daily.  He is taking zantac twice daily.    CURRENT TREATMENT: Planning to start EOX given toxicity related to cisplatin including worsening hearing lost.  He had PEG tube placement on 07/24/2013.  His tentative start date is 08/06/2013.   INTERVAL HISTORY: OSMAR HOWTON 58 y.o. male with a history of newly diagnosed metastatic GEJ adenocarcinoma is here for follow up.  He was seen by me on 07/18/2013.  He tolerated placement of PEG tube as noted above.  He reports 10 lb weight lost since his last visit.  He is still having nausea and anorexia.   He does Diflucan daily.  He is tolerating Boost Plus bid.   He takes oxycodone-acetaminophen once or twice a day.  He is here with his family member.   ADDITIONAL  ONCOLOGY HISTORY: As previously reported, he initially was evaluated by Dr. Osborne Casco in one or two weeks prior to his visit with Dr. Carlean Purl on 05/12/2013. He was having a "acid reflux" burning sensation and it bothered him at night. He also noted feeling as if his foods was feeling stuck. Due to persistence in these symptoms including odynophagia, reflux and solid dysphagia with weight loss (he lost 16 lbs over the past few months), her was referred to Dr. Carlean Purl. Dr. Carlean Purl scheduled him for an EGD on 05/13/2013. Of note, he had been on PPI bid since January with minimal improvement in symptoms. He reported a longstanding smoking history for the past 20 plus years. He smokes about 0.5 packs per day. He drinks 3-4 beers per day (every other day) for the past 15 plus years. He had an EGD which showed circumferential mass at the gastroesophageal junction. Multiple biopsies were obtained and it were determined to esophageal adenocarcinoma without amplification of HER-2 detected. He had a CT chest and abdomen and pelvis on 05/19/2013, which showed a mass of 4.6 cm along the GE junction. The esophagus was dilated above this mass. There was a suspected 12 mm lymph node near the GE junction. There was also bulky gastrohepatic ligament adenopathy noted with combined lymph nodes measuring up to 3 cm. There was also borderdline celiac axis adenopathy all suspicious for metastatic disease. There were no findings suggestive of hepatic metastatic disease, osseous metastatic disease or lung disease.   MEDICAL HISTORY: Past Medical  History  Diagnosis Date  . Allergy   . Hemorrhoids   . Back pain     right side  . Bunion, right foot   . Left tennis elbow   . Osteoarthritis     hands, knees, right hip  . Prostatitis   . Diverticulosis 2013  . Adenomatous colon polyp   . ED (erectile dysfunction)   . Tobacco abuse   . Reflux esophagitis   . Nonspecific elevation of levels of transaminase or lactic acid  dehydrogenase (LDH)   . S/P radiation therapy 06-10-13 to 06-23-13                                6-9 to 06-23-13                                                                                    Distal Esophagus / 30 Gy in 10 fractions                  . Status post chemotherapy     Started (ECF) consisting of  Epirubicin 50 mg/m2 IV, Cisplatin 60 mg/m2 IV on day #1, Fluorouracil 200 mg/m2 per day IV continuous infusion over 24 hours daily on days 1-21 cycled every 21 days for six months on 06/27/2013  . Brain metastases 06/19/13     Right Frontal Lobe    INTERIM HISTORY: has Internal hemorrhoids with complication; Personal history of colonic polyps; Post-op pain; Anal pain; Malignant neoplasm of lower third of esophagus; Bone metastases; Brain metastasis; Visual changes; Pancreatitis, acute; Acute pancreatitis; Epigastric abdominal pain; Hyperglycemia; Tobacco abuse; Protein-calorie malnutrition, severe; Oral pharyngeal candidiasis; Inadequate oral intake; Cancer related pain; Acute esophagitis; GERD (gastroesophageal reflux disease); Physical deconditioning; Leukopenia due to antineoplastic chemotherapy; Chemotherapy induced thrombocytopenia; Intractable hiccups; Anorexia; Hearing loss; and Dizziness on his problem list.    ALLERGIES:  has No Known Allergies.  MEDICATIONS: has a current medication list which includes the following prescription(s): aspirin-sod bicarb-citric acid, baclofen, diphenhydramine hcl, feeding supplement, fluconazole, lidocaine, lidocaine-prilocaine, montelukast, nicotine, nystatin, ondansetron, pantoprazole, prochlorperazine, sucralfate, tetrahydrozoline hcl, capecitabine, dronabinol, and oxycodone hcl, and the following Facility-Administered Medications: sodium chloride.  SURGICAL HISTORY:  Past Surgical History  Procedure Laterality Date  . Cyst on testicle Right   . Appendectomy    . Tonsillectomy    . Knee surgery Bilateral     arthroscopic surg /  bil knees  . Fatty  tumor Right     removal forearm  . Colonoscopy w/ polypectomy    . Sphincterotomy  08/28/2011    Procedure: SPHINCTEROTOMY;  Surgeon: Joyice Faster. Cornett, MD;  Location: [facility];  Service: General;  Laterality: N/A;  Lateral internal sphincterotomy  . Hemorrhoid surgery  08/28/2011    Procedure: HEMORRHOIDECTOMY;  Surgeon: Joyice Faster. Cornett, MD;  Location: [facility];  Service: General;  Laterality: N/A;  ,possible hemorrhoidectomy  . Examination under anesthesia  12/13/2011    Procedure: EXAM UNDER ANESTHESIA;  Surgeon: Marcello Moores A. Cornett, MD;  Location: [facility];  Service: General;  Laterality: N/A;  . Anal fissure repair  2013    Cornett  REVIEW OF SYSTEMS:   Constitutional: Denies fevers, chills or weight loss as noted in HPI Eyes: Denies blurriness of vision Ears, nose, mouth, throat, and face: Reports mucositis ; He reports hearing lost.  Respiratory: Denies cough, dyspnea or wheezes Cardiovascular: Denies palpitation, chest discomfort or lower extremity swelling Gastrointestinal:  Denies nausea, heartburn or change in bowel habits; as noted in HPI.  Skin: Denies abnormal skin rashes Lymphatics: Denies new lymphadenopathy or easy bruising Neurological:Denies numbness, tingling or new weaknesses Behavioral/Psych: Mood is stable, no new changes  All other systems were reviewed with the patient and are negative.  PHYSICAL EXAMINATION: ECOG PERFORMANCE STATUS: 0 - Asymptomatic  Blood pressure 115/70, pulse 84, temperature 98.3 F (36.8 C), temperature source Oral, resp. rate 18, height 5' 4"  (1.626 m), weight 93 lb 14.4 oz (42.593 kg).  GENERAL:alert, no distress and comfortable; cachectic male in no acute distress SKIN: skin color, texture, turgor are normal, no rashes or significant lesions; R port a cath (placed on 06/09/2013). EYES: normal, Conjunctiva are pink and non-injected, sclera clear OROPHARYNX:no exudate, no erythema and lips, buccal mucosa, and tongue normal  NECK: supple,  thyroid normal size, non-tender, without nodularity LYMPH:  no palpable lymphadenopathy in the cervical, axillary or supraclavicular LUNGS: clear to auscultation with normal breathing effort, no wheezes or rhonchi HEART: regular rate & rhythm and no murmurs and no lower extremity edema ABDOMEN:abdomen soft, non-tender and normal bowel sounds; PEG placement on 07/24/2013 Musculoskeletal:no cyanosis of digits and no clubbing  NEURO: alert & oriented x 3 with fluent speech, no focal motor/sensory deficits  Labs:  Lab Results  Component Value Date   WBC 10.1 07/30/2013   HGB 12.2* 07/30/2013   HCT 38.5 07/30/2013   MCV 89.3 07/30/2013   PLT 344 07/30/2013   NEUTROABS 6.5 07/30/2013      Chemistry      Component Value Date/Time   NA 145 07/30/2013 1437   NA 132* 07/05/2013 0436   K 3.9 07/30/2013 1437   K 4.0 07/05/2013 0436   CL 97 07/05/2013 0436   CO2 30* 07/30/2013 1437   CO2 24 07/05/2013 0436   BUN 15.9 07/30/2013 1437   BUN 9 07/05/2013 0436   CREATININE 1.4* 07/30/2013 1437   CREATININE 0.89 07/05/2013 0436      Component Value Date/Time   CALCIUM 9.6 07/30/2013 1437   CALCIUM 8.6 07/05/2013 0436   ALKPHOS 71 07/30/2013 1437   ALKPHOS 73 06/28/2013 2349   AST 32 07/30/2013 1437   AST 24 06/28/2013 2349   ALT 18 07/30/2013 1437   ALT 28 06/28/2013 2349   BILITOT 0.36 07/30/2013 1437   BILITOT 0.2* 06/28/2013 2349       Basic Metabolic Panel:  Recent Labs Lab 07/30/13 1437  NA 145  K 3.9  CO2 30*  GLUCOSE 119  BUN 15.9  CREATININE 1.4*  CALCIUM 9.6   GFR Estimated Creatinine Clearance: 35.5 ml/min (by C-G formula based on Cr of 1.4). Liver Function Tests:  Recent Labs Lab 07/30/13 1437  AST 32  ALT 18  ALKPHOS 71  BILITOT 0.36  PROT 6.9  ALBUMIN 3.3*    CBC:  Recent Labs Lab 07/30/13 1436  WBC 10.1  NEUTROABS 6.5  HGB 12.2*  HCT 38.5  MCV 89.3  PLT 344    Anemia work up No results found for this basename: VITAMINB12, FOLATE, FERRITIN, TIBC, IRON, RETICCTPCT,   in the last 72 hours  Studies:   RADIOGRAPHIC STUDIES: None  ASSESSMENT: [name]  58 y.o. male with a history of Anorexia - Plan: 0.9 %  sodium chloride infusion, capecitabine (XELODA) 500 MG tablet  Malignant neoplasm of lower third of esophagus - Plan: 0.9 %  sodium chloride infusion, CBC with Differential, Basic metabolic panel (Bmet) - [facility], Magnesium - [facility], capecitabine (XELODA) 500 MG tablet  Dehydration - Plan: 0.9 %  sodium chloride infusion, capecitabine (XELODA) 500 MG tablet  Protein-calorie malnutrition, severe  Hearing loss, bilateral  Cancer related pain  Brain metastasis  Bone metastases   PLAN:   1. Metastatic esophageal adenocarcinoma (GE junction), Stage IV --On prior visit, we reviewed his scans and pathology extensively consistent with the above. CT of chest and abdomen showed a long segment distal esophageal mass extending right to the GE junction that was approximately the 4.6 cm along with enlarged aorticopulmonary window lymph node, GE junction lymph node, gastrohepatic ligament adenopathy and borderline celiac axis adenopathy all suspicious for metastatic disease likely Stage IV.  We obtained a PET to see which nodes express FDG avidity and to determine if preoperative chemotherapy or chemoradiation prior to esophagectomy or definitive chemoradiation (given his good functional status) is appropriate. His Her2 was negative. His PET confirmed metastatic lymphadenopathy in mediastinum, gastrohepatic ligament, and right abdominal retroperitoneum.  Two intramuscular hypermetabolic foci within right shoulder for a girl, highly suspicious for intramuscular metastases.  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    Based on care and in NCCN guidelines version 3.2015 for Esophageal and esophagogastric jucntion cancers for metastatic systemic therapies, first line therapy with ECF is catagory 1 preferred regimens for medically fit patients with good PS and access to  frequent toxicity evaluation. We initially chose this regiment based on his preference to avoid oral chemotherapy in the setting of ongoing odynophagia and dysphagia.  He received one dose as noted above and had moderate to severe ototoxicity requiring discontinuation of ECF.   We will therefore switched to EOX (epirubicin, oxaliplatin and capecitabine).   He had a PEG tube placed on 07/24/2013 and we will therefore start on 08/06/2013.  It is based clinical trials demonstrating  that outcomes were comparable when capecitabine was substituted for infusional 5-FU in the ECF regimen, a finding that was reinforced in a subsequent meta-analysis of this and one other trial. They also showed (as did the meta-analysis that outcomes were comparable when oxaliplatin was substituted for cisplatin.  EOX had a median survival of 11.2 months.   --We also discussed again the indications for treatment would palliation for his cancer which is not curable given his PET findings and MRI of the brain, and the  benefits, and risks of chemotherapy in detail. The risks includes but is not limited to myelotoxicity, renal dysfunction, leukocytopenia or diarrhea, neurotoxicity, palmar plantar erythrodysestesiasis,  and other toxicities. He consented and agreed to proceed with chemotherapy understanding these risks and benefits. He will proceed with chemotherapy on 08/06/2013 with the following regimen:   --Epirubicin 50 mg per meter squared IV on day one  --Oxaliplatin 130 mg per meter squared IV on day one  --Capecitabine  625 mg per meter squared oral on days 1 through 21 every 21 days.    We will plan on 6-7 cycles of treatment or unacceptable toxicity.  References are as follows: Clint Lipps, et. Al, NEJM 1610;960:45 and Sumpter K, et al. Br J Cancer 2005; 40:9811.     --We will monitor CBC with differential and platelet count day 1 prior to these treatment cycle. We will assess BMP including creatinine, liver  function test  once per cycle on day 1 prior to each treatment cycle. We will monitor for neurotoxicity prior to each treatment cycle.   Prophylaxis with G-CSF is not warranted with the incidence of grade 3 or 4 febrile neutropenia is at 9%.   --Given his dysphagia and weight lost, he saw Dr. Isidore Moos in consideration of focal palliative XRT to his esophogeal mass. He was started on 06/08 and finished on 06/22.    He notes some improvement in his swallowing.   --He has evidence of bone metastases, and bisphosphonate therapy will also be considered. He had an MRI of brain with a solitary brain metastasis.   2. Visual Changes, resolved. --He had an MRI of brain as noted above with one isolated brain metastases.   Single isolated brain met (3 mm in size) is unlikely to cause his visual changes.   3. Hearing lost. --He was evaluated by audiology with significant hearing lost.  Recommended to stop cisplatin. He is being evaluated for hearing aide placement.   4. Tobacco Abuse.  --Referral to tobacco cessation classes were made last visit   5. Elevated creatinine.  --Creatinine up to 1.4 today.  We will arrange for hydration with normal saline 1 liter over 2 hours.   6. Pain in back and chest likely secondary to #1. --We prescribed oxycodone 10 mg q 8 hours as needed for pain.   7. S/p PEG (07/24/2013) secondary to anorexia and odynophagia secondary to #1.  --He will be referred to nutrition to further optimize his nutrition given his rapid weight lost. He will continue anti nausea medications.   8. Anorexia, cancer cachexia. --We will start marinol 2.5 mg bid.  Nutrition follow up.   9. Follow up.  --Patient will follow up in 1 week with for a symptom visit and in consideration of chemotherapy, first cycle of EOX. Labs weekly for his first cycle of EOX.   All questions were answered. The patient knows to call the clinic with any problems, questions or concerns. We can certainly see the patient much sooner if  necessary.  I spent 25 minutes counseling the patient face to face. The total time spent in the appointment was 40 minutes.    Emet Rafanan, MD 07/31/2013 12:24 PM

## 2013-07-31 NOTE — Progress Notes (Signed)
Nutrition followup completed with patient and niece, who reports she is a Marine scientist.  Weight has decreased significantly and was documented as 93 pounds decreased from 105.8 pounds June 15.  Patient's usual body weight is approximately 130-135 pounds in April 2015. Patient reports he continues to try to eat blenderized foods.  He is tolerating approximately 2-1/2 boost plus daily through his feeding tube.  Feeding tube placed on July 23. Patient is here today for fluids.  Patient meets criteria for severe malnutrition in in the context acute illness secondary to 32% weight loss over 3 months and less than 50% of energy intake for greater than 5 days.  Patient also has severe depletion of muscle and fat stores.  Nutrition diagnosis: Inadequate oral intake continues.  Estimated nutrition needs: 1500-1750 calories, 64-80 g protein, 1.7 L fluid.  Intervention:  Patient to begin Osmolite 1.5 at 40 cc an hour via continuous feeding over 12 hours from 6 PM to 6 AM with 60 cc of free water before and after continuous feedings. Patient will drink or flush feeding tube with an additional 8 ounces of water 3 times a day. If tolerated, patient will increase continuous feedings by 10 mL an hour daily, to goal rate of 90 mL an hour for 12 hours providing approximately 1590 calories, 63 g protein, 1672 mL free water. Patient will continue oral intake as tolerated.   Continuous feeding pump ordered through [facility].   Patient provided with samples of Osmolite 1.5.   Patient and niece educated on tube feeding administration and advancements. Fact sheets were provided.  Teach back method used.  Patient is at risk for refeeding syndrome secondary to severe malnutrition.  Recommend close monitoring of potassium, phosphorus, magnesium and glucose.  MD to replace electrolytes as needed.  Monitoring, evaluation, goals: Patient will tolerate continuous tube feedings to meet 100% of patient's estimated needs and  avoid refeeding syndrome.  Next visit: Thursday, August 6 in infusion.  **Disclaimer: This note was dictated with voice recognition software. Similar sounding words can inadvertently be transcribed and this note may contain transcription errors which may not have been corrected upon publication of note.**

## 2013-08-01 ENCOUNTER — Ambulatory Visit
Admission: RE | Admit: 2013-08-01 | Discharge: 2013-08-01 | Disposition: A | Discharge status: Home / Self Care | Payer: BC Managed Care – PPO | Source: Ambulatory Visit | Attending: Radiation Oncology | Admitting: Radiation Oncology

## 2013-08-01 ENCOUNTER — Other Ambulatory Visit: Payer: Self-pay | Admitting: Internal Medicine

## 2013-08-01 ENCOUNTER — Telehealth: Payer: Self-pay | Admitting: Oncology

## 2013-08-01 DIAGNOSIS — C7949 Secondary malignant neoplasm of other parts of nervous system: Principal | ICD-10-CM

## 2013-08-01 DIAGNOSIS — C155 Malignant neoplasm of lower third of esophagus: Secondary | ICD-10-CM

## 2013-08-01 DIAGNOSIS — C7931 Secondary malignant neoplasm of brain: Secondary | ICD-10-CM

## 2013-08-01 MED ORDER — GADOBENATE DIMEGLUMINE 529 MG/ML IV SOLN
8.0000 mL | Freq: Once | INTRAVENOUS | Status: AC | PRN
  Administered 2013-08-01: 8 mL via INTRAVENOUS

## 2013-08-01 NOTE — Telephone Encounter (Signed)
Per 07/31 POF add lab....Marland KitchenMarland KitchenKJ

## 2013-08-04 ENCOUNTER — Other Ambulatory Visit: Payer: Self-pay | Admitting: Medical Oncology

## 2013-08-04 ENCOUNTER — Other Ambulatory Visit: Payer: BC Managed Care – PPO

## 2013-08-04 ENCOUNTER — Telehealth: Payer: Self-pay | Admitting: Internal Medicine

## 2013-08-04 ENCOUNTER — Telehealth: Payer: Self-pay | Admitting: Medical Oncology

## 2013-08-04 ENCOUNTER — Ambulatory Visit
Admission: RE | Admit: 2013-08-04 | Discharge: 2013-08-04 | Disposition: A | Discharge status: Home / Self Care | Payer: BC Managed Care – PPO | Source: Ambulatory Visit | Attending: Radiation Oncology | Admitting: Radiation Oncology

## 2013-08-04 VITALS — BP 145/86 | HR 103 | Temp 98.2°F | Wt 98.6 lb

## 2013-08-04 DIAGNOSIS — C159 Malignant neoplasm of esophagus, unspecified: Secondary | ICD-10-CM | POA: Diagnosis not present

## 2013-08-04 DIAGNOSIS — C7931 Secondary malignant neoplasm of brain: Secondary | ICD-10-CM | POA: Diagnosis not present

## 2013-08-04 DIAGNOSIS — K5900 Constipation, unspecified: Secondary | ICD-10-CM | POA: Diagnosis not present

## 2013-08-04 DIAGNOSIS — Z79899 Other long term (current) drug therapy: Secondary | ICD-10-CM | POA: Insufficient documentation

## 2013-08-04 DIAGNOSIS — Z9221 Personal history of antineoplastic chemotherapy: Secondary | ICD-10-CM | POA: Diagnosis not present

## 2013-08-04 DIAGNOSIS — Z931 Gastrostomy status: Secondary | ICD-10-CM | POA: Diagnosis not present

## 2013-08-04 DIAGNOSIS — C7949 Secondary malignant neoplasm of other parts of nervous system: Secondary | ICD-10-CM

## 2013-08-04 DIAGNOSIS — Z510 Encounter for antineoplastic radiation therapy: Secondary | ICD-10-CM | POA: Insufficient documentation

## 2013-08-04 DIAGNOSIS — R1013 Epigastric pain: Secondary | ICD-10-CM | POA: Insufficient documentation

## 2013-08-04 DIAGNOSIS — C155 Malignant neoplasm of lower third of esophagus: Secondary | ICD-10-CM

## 2013-08-04 HISTORY — DX: Secondary malignant neoplasm of brain: C79.31

## 2013-08-04 HISTORY — DX: Personal history of antineoplastic chemotherapy: Z92.21

## 2013-08-04 HISTORY — DX: Personal history of irradiation: Z92.3

## 2013-08-04 MED ORDER — FLEET ENEMA 7-19 GM/118ML RE ENEM: 1.0000 | ENEMA | Freq: Once | RECTAL | Status: AC

## 2013-08-04 NOTE — Telephone Encounter (Signed)
Received a call from pharmacist at [facility] with questions regarding number of days pt is to take the xeloda. The care plan states D 1-14 but I spoke with Dr. Benay Spice and he states with the EOX  Treatment patients  take D 1-21. Pharmacist will fill as written.

## 2013-08-04 NOTE — Telephone Encounter (Signed)
s.w. pt daughter and advised on 8.5. appt...ok adn aware

## 2013-08-04 NOTE — Progress Notes (Signed)
Radiation Oncology         [phone] ________________________________  Name: Devin Gonzalez MRN: [id number]  Date: 08/04/2013  DOB: February 04, 1955  Follow-Up Visit Note  Outpatient  CC: [name], MD  Tisovec, Fransico Him, MD  Diagnosis and Prior Radiotherapy:   STAGE IV ESOPHAGEAL CANCER  INDICATION FOR TREATMENT: Palliative  TREATMENT DATES: 6-9 to 06-23-13  SITE/DOSE: Distal Esophagus / 30 Gy in 10 fractions   Narrative:  The patient returns today for routine follow-up.  He has some right occipital headaches. Continued epigastric pain, worse with eating. Eating small amounts of soft foods but most nutrition is by PEG.  Was losing weight, but this is now modestly improving after seeing Dory Peru on 7-30. He is constipated and has not been helped by oral laxatives.  No new neurologic complaints. Due to ototoxicity, chemo regimen has been changed. He will proceed with chemotherapy on 08/06/2013 with the following regimen:  --Epirubicin 50 mg per meter squared IV on day one  --Oxaliplatin 130 mg per meter squared IV on day one  --Capecitabine 625 mg per meter squared oral on days 1 through 21 every 21 days.   ALLERGIES:  has No Known Allergies.  Meds: Current Outpatient Prescriptions  Medication Sig Dispense Refill  . aspirin-sod bicarb-citric acid (ALKA-SELTZER) 325 MG TBEF tablet Take 325 mg by mouth every 6 (six) hours as needed (acid reflux).      . baclofen (LIORESAL) 10 MG tablet Take 10 mg by mouth 3 (three) times daily.      . DiphenhydrAMINE HCl (BENADRYL ALLERGY PO) Take 25 mg by mouth at bedtime.       . dronabinol (MARINOL) 2.5 MG capsule Take 1 capsule (2.5 mg total) by mouth 2 (two) times daily before a meal.  60 capsule  0  . feeding supplement (BOOST HIGH PROTEIN) LIQD Take 1 Container by mouth 3 (three) times daily between meals.      . lidocaine-prilocaine (EMLA) cream Apply 1 application topically as needed (for port access).      . LORazepam (ATIVAN) 0.5 MG  tablet Take 1 tablet (0.5 mg total) by mouth every 6 (six) hours as needed (Nausea or vomiting).  30 tablet  0  . montelukast (SINGULAIR) 10 MG tablet Take 10 mg by mouth daily.       . nicotine (NICODERM CQ - DOSED IN MG/24 HOURS) 14 mg/24hr patch Place 14 mg onto the skin daily.      . Nutritional Supplements (FEEDING SUPPLEMENT, OSMOLITE 1.5 CAL,) LIQD Begin Osmolite 1.5 at 40 ml/hr from 6 pm to 6 am with 60 cc free water before and after continuous feeding. Increase 10 ml/hr daily to goal of 90 ml/hr for 12 hours.  Drink or flush tube with additional 240 ml free water TID. SEND feeding pump but DO NOT SEND formula.  Patient has formula.  1 mL  0  . nystatin (MYCOSTATIN) 100000 UNIT/ML suspension Take 5 mLs by mouth 4 (four) times daily.      . ondansetron (ZOFRAN) 8 MG tablet Take 1 tablet by mouth every 8 (eight) hours as needed.      . Oxycodone HCl 10 MG TABS Take 1 tablet (10 mg total) by mouth every 8 (eight) hours as needed.  90 tablet  0  . pantoprazole (PROTONIX) 40 MG tablet Take 40 mg by mouth daily.      . prochlorperazine (COMPAZINE) 10 MG tablet Take 1 tablet by mouth every 6 (six) hours as needed.      Marland Kitchen  sucralfate (CARAFATE) 1 GM/10ML suspension Take 1 g by mouth 4 (four) times daily -  with meals and at bedtime.      . Tetrahydrozoline HCl (VISINE OP) Place 2-4 drops into both eyes 2 (two) times daily as needed. For dry eyes      . [START ON 08/06/2013] capecitabine (XELODA) 500 MG tablet Take 1 tablet (500 mg total) by mouth 2 (two) times daily after a meal.  42 tablet  0  . fluconazole (DIFLUCAN) 10 MG/ML suspension Take 100 mg by mouth daily.      Marland Kitchen lidocaine (XYLOCAINE) 2 % solution Use as directed 20 mLs in the mouth or throat 3 (three) times daily as needed for mouth pain.      . sodium phosphate (FLEET) 7-19 GM/118ML ENEM Place 133 mLs (1 enema total) rectally once. Please administer enema rectally x 1  133 mL  0   No current facility-administered medications for this  encounter.    Physical Findings: The patient is in no acute distress. Patient is alert and oriented.  weight is 98 lb 9.6 oz (44.725 kg). His temperature is 98.2 F (36.8 C). His blood pressure is 145/86 and his pulse is 103. His oxygen saturation is 97%. .   NAD, thin, tired appearing.  No neurologic focalities.  PEG tube site clean, intact.   Lab Findings: Lab Results  Component Value Date   WBC 10.1 07/30/2013   HGB 12.2* 07/30/2013   HCT 38.5 07/30/2013   MCV 89.3 07/30/2013   PLT 344 07/30/2013    Radiographic Findings: Mr Jeri Cos CN Contrast  08/01/2013   CLINICAL DATA:  SRS restaging.  Esophageal cancer.  EXAM: MRI HEAD WITHOUT AND WITH CONTRAST  TECHNIQUE: Multiplanar, multiecho pulse sequences of the brain and surrounding structures were obtained without and with intravenous contrast.  CONTRAST:  46m MULTIHANCE GADOBENATE DIMEGLUMINE 529 MG/ML IV SOLN  COMPARISON:  06/19/2013  FINDINGS: There is no acute infarct, intracranial hemorrhage, midline shift, or extra-axial fluid collection. There is mild to moderate cerebral atrophy, not significantly changed. Linear focus of intrinsic T1 hyperintensity in the right cerebellum is felt to be artifactual. 3 mm enhancing right frontal lesion is unchanged (series 10, image 120). No new enhancing lesions are identified. There is no significant vasogenic edema associated with the solitary lesion. A few small foci of T2 hyperintensity are noted in the cerebral white matter bilaterally, unchanged and nonspecific.  Orbits are unremarkable. Mild to moderate right maxillary sinus mucosal thickening is noted, similar to prior. Major intracranial vascular flow voids are preserved.  IMPRESSION: Unchanged, 3 mm right frontal lesion, concerning for solitary metastasis.   Electronically Signed   By: ALogan Bores  On: 08/01/2013 17:15   Ir Gastrostomy Tube  07/24/2013   CLINICAL DATA:  STAGE IV ESOPHAGEAL CANCER, MALNUTRITION  EXAM: FLUOROSCOPIC [facility] Date:  7/23/20157/23/2015 2:38 pm  Radiologist:  M. TDaryll Brod MD  Guidance:  Fluoroscopic  FLUOROSCOPY TIME:  1.8 minutes  MEDICATIONS AND MEDICAL HISTORY: 2 g Ancef administered within 1 hr of the procedure, 1.5 mg Versed, 150 mcg fentanyl, 1 mg glucagon  ANESTHESIA/SEDATION: 30 min  CONTRAST:  148mOMNIPAQUE IOHEXOL 300 MG/ML  SOLN  COMPLICATIONS: No immediate  PROCEDURE: Informed consent was obtained from the patient following explanation of the procedure, risks, benefits and alternatives. The patient understands, agrees and consents for the procedure. All questions were addressed. A time out was performed.  Maximal barrier sterile technique utilized including caps,  mask, sterile gowns, sterile gloves, large sterile drape, hand hygiene, and ChloraPrep.  Initially, an oral gastric catheter was advanced fluoroscopically into the stomach. Position confirmed with fluoroscopy. Stomach was insufflated with air. Stomach was well-visualized beneath the left subcostal margin fluoroscopically. Under sterile conditions and local anesthesia, percutaneous needle access was performed of the stomach to deploy a single T-tac for gastropexy. Position confirmed with contrast injection. A second puncture was main adjacent to the T tac into the stomach. A guidewire was inserted. Tract dilatation performed to inserted a 14 French Mallinckrodt gastrostomy. Retention loop formed in the stomach fundus. Position confirmed with contrast injection and fluoroscopic imaging. Catheter secured externally with a Prolene suture. No immediate complication. Patient tolerated placement well.  IMPRESSION: Successful fluoroscopic 14 French Mallinckrodt gastrostomy   Electronically Signed   By: Daryll Brod M.D.   On: 07/24/2013 14:52    Impression/Plan:  He has a stable, persistent brain metastasis and no new brain lesions as discussed today at tumor board. I discussed that observation vs SRS were two reasonable  approaches to address this. I also discussed WBRT, but feel this is more toxic than worthwhile, given his limited brain disease.  After lengthy discussion, the patient would like to proceed with stereotactic radiosurgery to the single brain tumor. He has met with neurosurgery already. Will simulate for treatment and plan to perform SRS in a week  For his epigastric pain, I advised to take Protonix daily, not intermittently as he admits to doing.  Consider ranitidine in future (but he is concerned about polypharmacy).  For constipation, today in clinic, he tolerated a Fleets Enema without any pain with results 2 small amount of hard stool. States he feels "little better." He was advised to obtain more fleets enemas and to take his current Senna-S once to twice daily   _____________________________________   Eppie Gibson, MD

## 2013-08-04 NOTE — Progress Notes (Signed)
Please see the Nurse Progress Note in the MD Initial Consult Encounter for this patient. 

## 2013-08-04 NOTE — Progress Notes (Signed)
Fleets enema as ordered by Dr. Isidore Moos.  Tolerated without any pain with results 2 small amount of hard stool.  States he feels " little better.  advised to obtain more fleets enemas and to take his current Senna-S once to twice daily.  Escorted to his family member on disposition to home.

## 2013-08-05 ENCOUNTER — Encounter: Payer: Self-pay | Admitting: Radiation Oncology

## 2013-08-05 NOTE — Progress Notes (Signed)
  Radiation Oncology         [phone] ________________________________  Name: Devin Gonzalez MRN: [id number]  Date: 08/04/2013  DOB: [birth year]  SIMULATION AND TREATMENT PLANNING NOTE  DIAGNOSIS:     ICD-9-CM  1. Brain metastasis 198.3     NARRATIVE:  The patient was brought to the [facility].  Identity was confirmed.  All relevant records and images related to the planned course of therapy were reviewed.  The patient freely provided informed written consent to proceed with treatment after reviewing the details related to the planned course of therapy. The consent form was witnessed and verified by the simulation staff.  Then, the patient was set-up in a stable reproducible supine position for radiation therapy.  A relocatable thermoplastic stereotactic head frame was fabricated for precise immobilization.  CT images were obtained.  Surface markings were placed.  The CT images were loaded into the planning software and fused with the patient's targeting MRI scan.  Then the target and avoidance structures were contoured.  Treatment planning then occurred.  The radiation prescription was entered and confirmed.  I have requested 3D planning  I have requested a DVH of the following structures: Brain stem, brain, left eye, right eye, lenses, optic chiasm, target volumes, uninvolved brain, and normal tissue.    PLAN:  The patient will receive 20 Gy in 1 fraction to the right frontal metastasis.    ________________________________  Eppie Gibson, MD

## 2013-08-06 ENCOUNTER — Ambulatory Visit: Payer: Self-pay | Admitting: Radiation Oncology

## 2013-08-06 ENCOUNTER — Other Ambulatory Visit: Payer: BC Managed Care – PPO

## 2013-08-06 ENCOUNTER — Ambulatory Visit: Payer: BC Managed Care – PPO

## 2013-08-06 ENCOUNTER — Ambulatory Visit (HOSPITAL_BASED_OUTPATIENT_CLINIC_OR_DEPARTMENT_OTHER): Payer: BC Managed Care – PPO | Admitting: Hematology

## 2013-08-06 ENCOUNTER — Other Ambulatory Visit: Payer: Self-pay | Admitting: *Deleted

## 2013-08-06 ENCOUNTER — Ambulatory Visit: Payer: BC Managed Care – PPO | Admitting: Nutrition

## 2013-08-06 ENCOUNTER — Ambulatory Visit (HOSPITAL_BASED_OUTPATIENT_CLINIC_OR_DEPARTMENT_OTHER): Payer: BC Managed Care – PPO

## 2013-08-06 ENCOUNTER — Other Ambulatory Visit (HOSPITAL_BASED_OUTPATIENT_CLINIC_OR_DEPARTMENT_OTHER): Payer: BC Managed Care – PPO

## 2013-08-06 VITALS — BP 122/69 | HR 103 | Temp 98.0°F | Resp 18 | Ht 64.0 in | Wt 97.4 lb

## 2013-08-06 DIAGNOSIS — C7951 Secondary malignant neoplasm of bone: Secondary | ICD-10-CM

## 2013-08-06 DIAGNOSIS — C155 Malignant neoplasm of lower third of esophagus: Secondary | ICD-10-CM

## 2013-08-06 DIAGNOSIS — T451X5A Adverse effect of antineoplastic and immunosuppressive drugs, initial encounter: Secondary | ICD-10-CM

## 2013-08-06 DIAGNOSIS — Z95828 Presence of other vascular implants and grafts: Secondary | ICD-10-CM

## 2013-08-06 DIAGNOSIS — R1310 Dysphagia, unspecified: Secondary | ICD-10-CM

## 2013-08-06 DIAGNOSIS — C7949 Secondary malignant neoplasm of other parts of nervous system: Secondary | ICD-10-CM

## 2013-08-06 DIAGNOSIS — F17200 Nicotine dependence, unspecified, uncomplicated: Secondary | ICD-10-CM

## 2013-08-06 DIAGNOSIS — R630 Anorexia: Secondary | ICD-10-CM

## 2013-08-06 DIAGNOSIS — C7952 Secondary malignant neoplasm of bone marrow: Secondary | ICD-10-CM

## 2013-08-06 DIAGNOSIS — Z510 Encounter for antineoplastic radiation therapy: Secondary | ICD-10-CM | POA: Diagnosis not present

## 2013-08-06 DIAGNOSIS — E43 Unspecified severe protein-calorie malnutrition: Secondary | ICD-10-CM

## 2013-08-06 DIAGNOSIS — R638 Other symptoms and signs concerning food and fluid intake: Secondary | ICD-10-CM

## 2013-08-06 DIAGNOSIS — C7931 Secondary malignant neoplasm of brain: Secondary | ICD-10-CM

## 2013-08-06 DIAGNOSIS — R944 Abnormal results of kidney function studies: Secondary | ICD-10-CM

## 2013-08-06 DIAGNOSIS — K859 Acute pancreatitis, unspecified: Secondary | ICD-10-CM

## 2013-08-06 DIAGNOSIS — M549 Dorsalgia, unspecified: Secondary | ICD-10-CM

## 2013-08-06 DIAGNOSIS — D701 Agranulocytosis secondary to cancer chemotherapy: Secondary | ICD-10-CM

## 2013-08-06 DIAGNOSIS — R739 Hyperglycemia, unspecified: Secondary | ICD-10-CM

## 2013-08-06 DIAGNOSIS — Z5111 Encounter for antineoplastic chemotherapy: Secondary | ICD-10-CM

## 2013-08-06 DIAGNOSIS — R64 Cachexia: Secondary | ICD-10-CM

## 2013-08-06 LAB — CBC WITH DIFFERENTIAL/PLATELET
BASO%: 0.7 % (ref 0.0–2.0)
BASOS ABS: 0.1 10*3/uL (ref 0.0–0.1)
EOS ABS: 0.2 10*3/uL (ref 0.0–0.5)
EOS%: 1.5 % (ref 0.0–7.0)
HCT: 36.8 % — ABNORMAL LOW (ref 38.4–49.9)
HGB: 12 g/dL — ABNORMAL LOW (ref 13.0–17.1)
LYMPH%: 15.1 % (ref 14.0–49.0)
MCH: 28.4 pg (ref 27.2–33.4)
MCHC: 32.6 g/dL (ref 32.0–36.0)
MCV: 87.1 fL (ref 79.3–98.0)
MONO#: 1.2 10*3/uL — AB (ref 0.1–0.9)
MONO%: 11.2 % (ref 0.0–14.0)
NEUT#: 7.9 10*3/uL — ABNORMAL HIGH (ref 1.5–6.5)
NEUT%: 71.5 % (ref 39.0–75.0)
Platelets: 353 10*3/uL (ref 140–400)
RBC: 4.23 10*6/uL (ref 4.20–5.82)
RDW: 13.7 % (ref 11.0–14.6)
WBC: 11.1 10*3/uL — AB (ref 4.0–10.3)
lymph#: 1.7 10*3/uL (ref 0.9–3.3)

## 2013-08-06 LAB — BASIC METABOLIC PANEL (CC13)
Anion Gap: 9 mEq/L (ref 3–11)
BUN: 9.8 mg/dL (ref 7.0–26.0)
CALCIUM: 9.8 mg/dL (ref 8.4–10.4)
CO2: 30 meq/L — AB (ref 22–29)
Chloride: 103 mEq/L (ref 98–109)
Creatinine: 0.9 mg/dL (ref 0.7–1.3)
GLUCOSE: 118 mg/dL (ref 70–140)
POTASSIUM: 4.1 meq/L (ref 3.5–5.1)
SODIUM: 142 meq/L (ref 136–145)

## 2013-08-06 LAB — MAGNESIUM (CC13): MAGNESIUM: 1.9 mg/dL (ref 1.5–2.5)

## 2013-08-06 MED ORDER — FAMOTIDINE IN NACL 20-0.9 MG/50ML-% IV SOLN
20.0000 mg | Freq: Once | INTRAVENOUS | Status: AC
  Administered 2013-08-06: 20 mg via INTRAVENOUS

## 2013-08-06 MED ORDER — OXYCODONE-ACETAMINOPHEN 5-325 MG PO TABS
ORAL_TABLET | ORAL | Status: AC
  Filled 2013-08-06: qty 2

## 2013-08-06 MED ORDER — DEXAMETHASONE SODIUM PHOSPHATE 20 MG/5ML IJ SOLN
20.0000 mg | Freq: Once | INTRAMUSCULAR | Status: AC
  Administered 2013-08-06: 20 mg via INTRAVENOUS

## 2013-08-06 MED ORDER — FAMOTIDINE IN NACL 20-0.9 MG/50ML-% IV SOLN
INTRAVENOUS | Status: AC
  Filled 2013-08-06: qty 50

## 2013-08-06 MED ORDER — SODIUM CHLORIDE 0.9 % IJ SOLN
10.0000 mL | INTRAMUSCULAR | Status: DC | PRN
  Administered 2013-08-06: 10 mL
  Filled 2013-08-06: qty 10

## 2013-08-06 MED ORDER — SODIUM CHLORIDE 0.9 % IJ SOLN
10.0000 mL | INTRAMUSCULAR | Status: DC | PRN
  Administered 2013-08-06: 10 mL via INTRAVENOUS
  Filled 2013-08-06: qty 10

## 2013-08-06 MED ORDER — DEXTROSE 5 % IV SOLN: Freq: Once | INTRAVENOUS | Status: DC

## 2013-08-06 MED ORDER — EPIRUBICIN HCL CHEMO IV INJECTION 50 MG/25ML
25.0000 mg/m2 | Freq: Once | INTRAVENOUS | Status: AC
  Administered 2013-08-06: 36 mg via INTRAVENOUS
  Filled 2013-08-06: qty 18

## 2013-08-06 MED ORDER — OXYCODONE-ACETAMINOPHEN 5-325 MG PO TABS
2.0000 | ORAL_TABLET | Freq: Once | ORAL | Status: AC
  Administered 2013-08-06: 2 via ORAL

## 2013-08-06 MED ORDER — ONDANSETRON 16 MG/50ML IVPB (CHCC)
16.0000 mg | Freq: Once | INTRAVENOUS | Status: AC
  Administered 2013-08-06: 16 mg via INTRAVENOUS

## 2013-08-06 MED ORDER — OXALIPLATIN CHEMO INJECTION 100 MG/20ML
50.0000 mg/m2 | Freq: Once | INTRAVENOUS | Status: AC
  Administered 2013-08-06: 75 mg via INTRAVENOUS
  Filled 2013-08-06: qty 15

## 2013-08-06 MED ORDER — DEXAMETHASONE SODIUM PHOSPHATE 20 MG/5ML IJ SOLN
INTRAMUSCULAR | Status: AC
  Filled 2013-08-06: qty 5

## 2013-08-06 MED ORDER — HEPARIN SOD (PORK) LOCK FLUSH 100 UNIT/ML IV SOLN
500.0000 [IU] | Freq: Once | INTRAVENOUS | Status: AC | PRN
  Administered 2013-08-06: 500 [IU]
  Filled 2013-08-06: qty 5

## 2013-08-06 MED ORDER — HYDROCODONE-ACETAMINOPHEN 5-325 MG PO TABS: 2.0000 | ORAL_TABLET | Freq: Once | ORAL | Status: DC

## 2013-08-06 MED ORDER — ONDANSETRON 16 MG/50ML IVPB (CHCC)
INTRAVENOUS | Status: AC
  Filled 2013-08-06: qty 16

## 2013-08-06 NOTE — Patient Instructions (Signed)

## 2013-08-06 NOTE — Progress Notes (Signed)
Followup completed with patient and niece.  Patient states he is tolerating Osmolite 1.5 via feeding tube at 70 mL an hour and plans on increasing to 80 mL an hour tonight with goal rate of 90 mL an hour for 12 hours to provide approximately 1590 calories, 63 g protein, 1672 mL free water.  He is eating very little by mouth.  Patient requesting samples of Carnation instant breakfast today along with additional syringes and formulary.  Labs on 8/5: Magnesium 1.9, potassium 4.1, glucose 118.  Phosphorus not completed.   Patient continues to meet criteria for severe malnutrition.  Nutrition diagnosis: Inadequate oral intake continues.  Intervention: Patient to continue tube feeding advancement to goal of 90 mL of Osmolite 1.5 for 12 hours daily along with oral intake. Educated patient to be sure to consume an additional 8 ounces of water 3 times a day by mouth or through tube. Oral intake as tolerated. Teach back method used.  Questions answered.  Monitoring, evaluation, goals: Patient is tolerating tube feeding advancement and thus far has avoided refeeding syndrome. Recommend running glucose, potassium, magnesium and phosphorus on Friday this week to monitor for refeeding syndrome.  Next visit: To be scheduled.  **Disclaimer: This note was dictated with voice recognition software. Similar sounding words can inadvertently be transcribed and this note may contain transcription errors which may not have been corrected upon publication of note.**

## 2013-08-06 NOTE — Patient Instructions (Signed)
[facility] Discharge Instructions for Patients Receiving Chemotherapy  Today you received the following chemotherapy agents: Ellence and Oxaliplatin.  To help prevent nausea and vomiting after your treatment, we encourage you to take your nausea medication: compazine 10mg  every 6 hours as needed.   If you develop nausea and vomiting that is not controlled by your nausea medication, call the clinic.   BELOW ARE SYMPTOMS THAT SHOULD BE REPORTED IMMEDIATELY:  *FEVER GREATER THAN 100.5 F  *CHILLS WITH OR WITHOUT FEVER  NAUSEA AND VOMITING THAT IS NOT CONTROLLED WITH YOUR NAUSEA MEDICATION  *UNUSUAL SHORTNESS OF BREATH  *UNUSUAL BRUISING OR BLEEDING  TENDERNESS IN MOUTH AND THROAT WITH OR WITHOUT PRESENCE OF ULCERS  *URINARY PROBLEMS  *BOWEL PROBLEMS  UNUSUAL RASH Items with * indicate a potential emergency and should be followed up as soon as possible.  Feel free to call the clinic you have any questions or concerns. The clinic phone number is [phone].

## 2013-08-07 ENCOUNTER — Encounter: Payer: BC Managed Care – PPO | Admitting: Nutrition

## 2013-08-07 ENCOUNTER — Telehealth: Payer: Self-pay | Admitting: *Deleted

## 2013-08-07 ENCOUNTER — Other Ambulatory Visit: Payer: Self-pay | Admitting: *Deleted

## 2013-08-07 ENCOUNTER — Ambulatory Visit: Payer: BC Managed Care – PPO

## 2013-08-07 ENCOUNTER — Other Ambulatory Visit: Payer: BC Managed Care – PPO

## 2013-08-07 ENCOUNTER — Telehealth: Payer: Self-pay | Admitting: Internal Medicine

## 2013-08-07 DIAGNOSIS — C155 Malignant neoplasm of lower third of esophagus: Secondary | ICD-10-CM

## 2013-08-07 NOTE — Telephone Encounter (Signed)
lmonvm for pt neice [name] [phone]) confirming next appt for 8/7. returned [name]'s call re when next appt due when pt was last here he only has appts left for Monday. lmonvm for kesha that pt did not have any appt for monday 08/11/13 but did have appt for 8/7. kesha asked to call our office back tomorrow and s/w desk nurse re the need for these appts if she was not aware of can't make it. also lmonvm for desk nurse informing her of this and that pt has no other appts on schedule other than 8/7.

## 2013-08-07 NOTE — Progress Notes (Signed)
[facility] OFFICE PROGRESS NOTE  Devin Pao, MD [place] [address]  DIAGNOSIS: Esophageal Tumor Patient to start EOC (Epirubicin,Oxaliplatin and Capecitabine) regimen today.  Chief Complaint  Patient presents with  . Follow-up    PRIOR TREATMENT:  He started palliative XRT to esophageal mass on 06/09/2013 per Dr. Eppie Gibson. It ended on 06/23/2013 s/p 10 treatments. He then started (ECF) consisting of  Epirubicin 50 mg/m2 IV, Cisplatin 60 mg/m2 IV on day #1, Fluorouracil 200 mg/m2 per day IV continuous infusion over 24 hours daily on days 1-21 cycled every 21 days for six months on 06/27/2013.  He tolerated part of his first cycle but was hospitalized due to worsening esophagitis and had notable hearing lost.    He takes that protonix once daily.  He is taking zantac twice daily.  Decision was made by Dr Juliann Mule to change from ECF to EOF omitting Cisplatin which I agree.  CURRENT TREATMENT: Planning to start [place] today 08/06/2013 given toxicity related to cisplatin including worsening hearing lost.  He had PEG tube placement on 07/24/2013.  INTERVAL HISTORY: Devin Gonzalez 58 y.o. male with a history of newly diagnosed metastatic GEJ adenocarcinoma is here for follow up.  He was seen by Dr Juliann Mule on 07/30/2013.  He tolerated placement of PEG tube as noted above. We will remove sutures in office today.  He reports 10 lb weight lost.  He is still having nausea and anorexia.   He does Diflucan daily.  He is tolerating Boost Plus bid.   He takes oxycodone-acetaminophen once or twice a day.  He is here with his niece Devin Gonzalez. He looks emaciated with significant muscle wasting and cachexia.  ADDITIONAL ONCOLOGY HISTORY: As previously reported, he initially was evaluated by Dr. Osborne Casco in one or two weeks prior to his visit with Dr. Carlean Purl on 05/12/2013. He was having a "acid reflux" burning sensation and it bothered him at night. He also noted feeling as if his  foods was feeling stuck. Due to persistence in these symptoms including odynophagia, reflux and solid dysphagia with weight loss (he lost 16 lbs over the past few months), her was referred to Dr. Carlean Purl. Dr. Carlean Purl scheduled him for an EGD on 05/13/2013. Of note, he had been on PPI bid since January with minimal improvement in symptoms. He reported a longstanding smoking history for the past 20 plus years. He smokes about 0.5 packs per day. He drinks 3-4 beers per day (every other day) for the past 15 plus years. He had an EGD which showed circumferential mass at the gastroesophageal junction. Multiple biopsies were obtained and it were determined to esophageal adenocarcinoma without amplification of HER-2 detected. He had a CT chest and abdomen and pelvis on 05/19/2013, which showed a mass of 4.6 cm along the GE junction. The esophagus was dilated above this mass. There was a suspected 12 mm lymph node near the GE junction. There was also bulky gastrohepatic ligament adenopathy noted with combined lymph nodes measuring up to 3 cm. There was also borderdline celiac axis adenopathy all suspicious for metastatic disease. There were no findings suggestive of hepatic metastatic disease, osseous metastatic disease or lung disease.   MEDICAL HISTORY: Past Medical History  Diagnosis Date  . Allergy   . Hemorrhoids   . Back pain     right side  . Bunion, right foot   . Left tennis elbow   . Osteoarthritis     hands, knees, right hip  .  Prostatitis   . Diverticulosis 2013  . Adenomatous colon polyp   . ED (erectile dysfunction)   . Tobacco abuse   . Reflux esophagitis   . Nonspecific elevation of levels of transaminase or lactic acid dehydrogenase (LDH)   . S/P radiation therapy 06-10-13 to 06-23-13                                6-9 to 06-23-13                                                                                    Distal Esophagus / 30 Gy in 10 fractions                  . Status post  chemotherapy     Started (ECF) consisting of  Epirubicin 50 mg/m2 IV, Cisplatin 60 mg/m2 IV on day #1, Fluorouracil 200 mg/m2 per day IV continuous infusion over 24 hours daily on days 1-21 cycled every 21 days for six months on 06/27/2013  . Brain metastases 06/19/13     Right Frontal Lobe    INTERIM HISTORY: has Internal hemorrhoids with complication; Personal history of colonic polyps; Post-op pain; Anal pain; Malignant neoplasm of lower third of esophagus; Bone metastases; Brain metastasis; Visual changes; Pancreatitis, acute; Acute pancreatitis; Epigastric abdominal pain; Hyperglycemia; Tobacco abuse; Protein-calorie malnutrition, severe; Oral pharyngeal candidiasis; Inadequate oral intake; Cancer related pain; Acute esophagitis; GERD (gastroesophageal reflux disease); Physical deconditioning; Leukopenia due to antineoplastic chemotherapy; Chemotherapy induced thrombocytopenia; Intractable hiccups; Anorexia; Hearing loss; and Dizziness on his problem list.    ALLERGIES:  has No Known Allergies.  MEDICATIONS: has a current medication list which includes the following prescription(s): aspirin-sod bicarb-citric acid, baclofen, capecitabine, diphenhydramine hcl, dronabinol, feeding supplement, fluconazole, lidocaine, lidocaine-prilocaine, lorazepam, montelukast, nicotine, feeding supplement (osmolite 1.5 cal), nystatin, ondansetron, oxycodone hcl, pantoprazole, prochlorperazine, sodium phosphate, sucralfate, and tetrahydrozoline hcl.  SURGICAL HISTORY:  Past Surgical History  Procedure Laterality Date  . Cyst on testicle Right   . Appendectomy    . Tonsillectomy    . Knee surgery Bilateral     arthroscopic surg /  bil knees  . Fatty tumor Right     removal forearm  . Colonoscopy w/ polypectomy    . Sphincterotomy  08/28/2011    Procedure: SPHINCTEROTOMY;  Surgeon: Joyice Faster. Cornett, MD;  Location: [facility];  Service: General;  Laterality: N/A;  Lateral internal sphincterotomy  . Hemorrhoid  surgery  08/28/2011    Procedure: HEMORRHOIDECTOMY;  Surgeon: Joyice Faster. Cornett, MD;  Location: [facility];  Service: General;  Laterality: N/A;  ,possible hemorrhoidectomy  . Examination under anesthesia  12/13/2011    Procedure: EXAM UNDER ANESTHESIA;  Surgeon: Marcello Moores A. Cornett, MD;  Location: [facility];  Service: General;  Laterality: N/A;  . Anal fissure repair  2013    Cornett    REVIEW OF SYSTEMS:   Constitutional: Denies fevers, chills or weight loss as noted in HPI Eyes: Denies blurriness of vision Ears, nose, mouth, throat, and face: Reports mucositis ; He reports hearing lost.  Respiratory: Denies cough, dyspnea or wheezes Cardiovascular: Denies palpitation, chest discomfort  or lower extremity swelling Gastrointestinal:  Denies nausea, heartburn or change in bowel habits; as noted in HPI.  Skin: Denies abnormal skin rashes Lymphatics: Denies new lymphadenopathy or easy bruising Neurological:Denies numbness, tingling or new weaknesses Behavioral/Psych: Mood is stable, no new changes  All other systems were reviewed with the patient and are negative.  PHYSICAL EXAMINATION: ECOG PERFORMANCE STATUS: 0 - Asymptomatic  Blood pressure 122/69, pulse 103, temperature 98 F (36.7 C), temperature source Oral, resp. rate 18, height 5' 4"  (1.626 m), weight 97 lb 6.4 oz (44.18 kg).  GENERAL:alert, no distress and comfortable; cachectic male in no acute distress SKIN: skin color, texture, turgor are normal, no rashes or significant lesions; R port a cath (placed on 06/09/2013). EYES: normal, Conjunctiva are pink and non-injected, sclera clear OROPHARYNX:no exudate, no erythema and lips, buccal mucosa, and tongue normal  NECK: supple, thyroid normal size, non-tender, without nodularity LYMPH:  no palpable lymphadenopathy in the cervical, axillary or supraclavicular LUNGS: clear to auscultation with normal breathing effort, no wheezes or rhonchi HEART: regular rate & rhythm and no murmurs and no  lower extremity edema ABDOMEN:abdomen soft, non-tender and normal bowel sounds; PEG placement on 07/24/2013 Musculoskeletal:no cyanosis of digits and no clubbing  NEURO: alert & oriented x 3 with fluent speech, no focal motor/sensory deficits  Labs:         PET SCAN 06/03/2013 IMPRESSION:  Hypermetabolic distal esophageal mass, consistent with primary esophageal carcinoma.  Metastatic lymphadenopathy in mediastinum, gastrohepatic ligament, and right abdominal retroperitoneum. Two intramuscular hypermetabolic foci within right shoulder for a girl, highly suspicious for intramuscular metastases. 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          MRI BRAIN 08/01/2013 IMPRESSION:  Unchanged, 3 mm right frontal lesion, concerning for solitary metastasis.       ASSESSMENT: Devin Gonzalez 58 y.o. male with a history of Esophageal cancer as detailed above.   PLAN:   1. Metastatic esophageal adenocarcinoma (GE junction), Stage IV --CT of chest and abdomen showed a long segment distal esophageal mass extending right to the GE junction that was approximately the 4.6 cm along with enlarged aorticopulmonary window lymph node, GE junction lymph node, gastrohepatic ligament adenopathy and borderline celiac axis adenopathy all suspicious for metastatic disease likely Stage IV.  A PET was done showed nodes express FDG avidity and to determine if preoperative chemotherapy or chemoradiation prior to esophagectomy or definitive chemoradiation (given his good functional status) is appropriate. His Her2 was negative. His PET confirmed metastatic lymphadenopathy in mediastinum, gastrohepatic ligament, and right abdominal retroperitoneum.  Two intramuscular hypermetabolic foci within right shoulder girdle, highly suspicious for intramuscular metastases.  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  MRI Brain showed a solitary right brain met.  Based on care and in NCCN guidelines version 3.2015 for Esophageal and  esophagogastric jucntion cancers for metastatic systemic therapies, first line therapy with ECF is catagory 1 preferred regimens for medically fit patients with good PS and access to frequent toxicity evaluation. We initially chose this regiment based on his preference to avoid oral chemotherapy in the setting of ongoing odynophagia and dysphagia.  He received one dose as noted above and had moderate to severe ototoxicity requiring discontinuation of ECF.   He is now switched to EOX (epirubicin, oxaliplatin and capecitabine).   He had a PEG tube placed on 07/24/2013 and we are starting chemo today on 08/06/2013.  It is based clinical trials demonstrating  that outcomes were comparable when capecitabine was substituted for infusional 5-FU in the ECF regimen, a  finding that was reinforced in a subsequent meta-analysis of this and one other trial. They also showed (as did the meta-analysis that outcomes were comparable when oxaliplatin was substituted for cisplatin.  EOX had a median survival of 11.2 months.  --We also discussed again the indications for treatment would palliation for his cancer which is not curable given his PET findings and MRI of the brain, and the  benefits, and risks of chemotherapy in detail. The risks includes but is not limited to myelotoxicity, renal dysfunction, leukocytopenia or diarrhea, neurotoxicity, palmar plantar erythrodysestesiasis,  and other toxicities. He consented and agreed to proceed with chemotherapy understanding these risks and benefits. His regimen is as follows:   --Epirubicin 50 mg per meter squared IV on day one  --Oxaliplatin 130 mg per meter squared IV on day one  --Capecitabine  625 mg per meter squared oral on days 1 through 21 every 21 days. (His Xeloda is arriving on Saturday from [place] through mail order and he will start same day twice a day regimen)    We will plan on 6-7 cycles of treatment or unacceptable toxicity.  References are as follows:  Clint Lipps, et. Al, NEJM 7253;664:40 and Sumpter K, et al. Br J Cancer 2005; 34:7425.     --We will monitor CBC with differential and platelet count day 1 prior to these treatment cycle. We will assess BMP including creatinine, liver function test once per cycle on day 1 prior to each treatment cycle. We will monitor for neurotoxicity prior to each treatment cycle.   Prophylaxis with G-CSF is not warranted with the incidence of grade 3 or 4 febrile neutropenia is at 9%.   --Given his dysphagia and weight lost, he saw Dr. Isidore Moos in consideration of focal palliative XRT to his esophogeal mass. He was started on 06/08 and finished on 06/22.    He notes some improvement in his swallowing.   --He has evidence of bone metastases, and bisphosphonate therapy with Delton See will start on August 13 2013 to be given once a month.He had an MRI of brain with a solitary brain metastasis and he is getting a stereotactic brain treatment for that on August 10th with Dr Lanell Persons.  -- All chemotherapy side effects were discussed with patient in detail which include but are not limited to nausea, vomiting, hair loss, myelosupression and related complications, neurotoxicity from Oxaliplatin, infusion reactions, cardiotoxicty from Epirubicin, fatigue, asthenia and risk of dying. The goal of therapy is not curative but Palliative.  2. Visual Changes, resolved. --He had an MRI of brain as noted above with one isolated brain metastases.   Single isolated brain met (3 mm in size) is unlikely to cause his visual changes. Treatment with radiation planned on August 11 2013.  3. Hearing lost. --He was evaluated by audiology with significant hearing lost.  Recommended to stop cisplatin. He is being evaluated for hearing aide placement.   4. Tobacco Abuse.  --Referral to tobacco cessation classes were made last visit   5. Elevated creatinine.  --Creatinine was 1.4 and back to normal today.  We will arrange for hydration with  normal saline 1 liter over 2 hours.   6. Pain in back and chest likely secondary to #1. --We prescribed oxycodone 10 mg q 8 hours as needed for pain.   7. S/p PEG (07/24/2013) secondary to anorexia and odynophagia secondary to #1.  --He will be referred to nutrition to further optimize his nutrition given his rapid weight lost. He will continue anti  nausea medications.   8. Anorexia, cancer cachexia. --We will start marinol 2.5 mg bid.  Nutrition follow up.   9. Follow up.  --Patient will have labs and MD visit on August 13 2013 and Xgeva given as his renal function is normal now. It is associated with a small risk of ONJ but currently the benefits outweigh the risk. He will also get 1 liter hydration on 08/13/2013. --Patient to have another f/u on August 24 with labs, hydration and MD visit. --Tentatively his cycle no 2 will start on August 26th.     All questions were answered. The patient knows to call the clinic with any problems, questions or concerns. We can certainly see the patient much sooner if necessary.  I spent 22mnutes counseling the patient face to face. The total time spent in the appointment was 50 minutes. This was my first visit with patient and I spent more time discussing the treatment goals and patient was comfortable and appreciative.    ABernadene Bell MD Medical Hematologist/Oncologist CNew KentPager: [phone] No: [phone] 08/07/2013 8:59 AM

## 2013-08-07 NOTE — Progress Notes (Signed)
Called back niece, Varney Biles after talking with nutritionist and gave her lab/infusion appointment for tomorrow at 2:30 pm. He will also receive dose of Pepcid IV as requested.

## 2013-08-07 NOTE — Telephone Encounter (Signed)
Tube feeding rate was increased last night to 80 ml/hr and he woke up this am and vomited. None since. Has reduced feeding rate and has antiemetics to take that work. No new pain or other adverse events from treatment. Has not yet received the Xeloda shipment. Has not had BM yet today-reminded of potential for diarrhea with the Xeloda.

## 2013-08-08 ENCOUNTER — Other Ambulatory Visit: Payer: Self-pay | Admitting: *Deleted

## 2013-08-08 ENCOUNTER — Ambulatory Visit (HOSPITAL_BASED_OUTPATIENT_CLINIC_OR_DEPARTMENT_OTHER): Payer: BC Managed Care – PPO

## 2013-08-08 ENCOUNTER — Ambulatory Visit: Payer: BC Managed Care – PPO | Admitting: Physician Assistant

## 2013-08-08 ENCOUNTER — Other Ambulatory Visit: Payer: BC Managed Care – PPO

## 2013-08-08 ENCOUNTER — Ambulatory Visit: Payer: BC Managed Care – PPO | Admitting: Nutrition

## 2013-08-08 ENCOUNTER — Other Ambulatory Visit (HOSPITAL_BASED_OUTPATIENT_CLINIC_OR_DEPARTMENT_OTHER): Payer: BC Managed Care – PPO

## 2013-08-08 ENCOUNTER — Encounter: Payer: Self-pay | Admitting: Hematology

## 2013-08-08 ENCOUNTER — Telehealth: Payer: Self-pay

## 2013-08-08 ENCOUNTER — Ambulatory Visit: Payer: BC Managed Care – PPO

## 2013-08-08 VITALS — BP 102/62 | HR 97 | Resp 18

## 2013-08-08 DIAGNOSIS — C7951 Secondary malignant neoplasm of bone: Secondary | ICD-10-CM

## 2013-08-08 DIAGNOSIS — C155 Malignant neoplasm of lower third of esophagus: Secondary | ICD-10-CM

## 2013-08-08 DIAGNOSIS — C7931 Secondary malignant neoplasm of brain: Secondary | ICD-10-CM

## 2013-08-08 DIAGNOSIS — K859 Acute pancreatitis, unspecified: Secondary | ICD-10-CM

## 2013-08-08 DIAGNOSIS — R638 Other symptoms and signs concerning food and fluid intake: Secondary | ICD-10-CM

## 2013-08-08 DIAGNOSIS — E46 Unspecified protein-calorie malnutrition: Secondary | ICD-10-CM

## 2013-08-08 DIAGNOSIS — R634 Abnormal weight loss: Secondary | ICD-10-CM

## 2013-08-08 DIAGNOSIS — Z95828 Presence of other vascular implants and grafts: Secondary | ICD-10-CM

## 2013-08-08 DIAGNOSIS — R630 Anorexia: Secondary | ICD-10-CM

## 2013-08-08 DIAGNOSIS — R1310 Dysphagia, unspecified: Secondary | ICD-10-CM

## 2013-08-08 DIAGNOSIS — T451X5A Adverse effect of antineoplastic and immunosuppressive drugs, initial encounter: Secondary | ICD-10-CM

## 2013-08-08 DIAGNOSIS — E43 Unspecified severe protein-calorie malnutrition: Secondary | ICD-10-CM

## 2013-08-08 DIAGNOSIS — D701 Agranulocytosis secondary to cancer chemotherapy: Secondary | ICD-10-CM

## 2013-08-08 DIAGNOSIS — R739 Hyperglycemia, unspecified: Secondary | ICD-10-CM

## 2013-08-08 LAB — COMPREHENSIVE METABOLIC PANEL (CC13)
ALT: 23 U/L (ref 0–55)
ANION GAP: 9 meq/L (ref 3–11)
AST: 30 U/L (ref 5–34)
Albumin: 3.3 g/dL — ABNORMAL LOW (ref 3.5–5.0)
Alkaline Phosphatase: 66 U/L (ref 40–150)
BUN: 19.9 mg/dL (ref 7.0–26.0)
CALCIUM: 9.7 mg/dL (ref 8.4–10.4)
CO2: 30 meq/L — AB (ref 22–29)
CREATININE: 0.9 mg/dL (ref 0.7–1.3)
Chloride: 101 mEq/L (ref 98–109)
GLUCOSE: 122 mg/dL (ref 70–140)
Potassium: 4.4 mEq/L (ref 3.5–5.1)
Sodium: 140 mEq/L (ref 136–145)
Total Bilirubin: 0.5 mg/dL (ref 0.20–1.20)
Total Protein: 7 g/dL (ref 6.4–8.3)

## 2013-08-08 LAB — CBC WITH DIFFERENTIAL/PLATELET
BASO%: 0.5 % (ref 0.0–2.0)
Basophils Absolute: 0 10*3/uL (ref 0.0–0.1)
EOS ABS: 0.2 10*3/uL (ref 0.0–0.5)
EOS%: 4 % (ref 0.0–7.0)
HCT: 33.6 % — ABNORMAL LOW (ref 38.4–49.9)
HGB: 11.4 g/dL — ABNORMAL LOW (ref 13.0–17.1)
LYMPH#: 1.1 10*3/uL (ref 0.9–3.3)
LYMPH%: 27.5 % (ref 14.0–49.0)
MCH: 29 pg (ref 27.2–33.4)
MCHC: 33.9 g/dL (ref 32.0–36.0)
MCV: 85.5 fL (ref 79.3–98.0)
MONO#: 0.9 10*3/uL (ref 0.1–0.9)
MONO%: 22.2 % — AB (ref 0.0–14.0)
NEUT%: 45.8 % (ref 39.0–75.0)
NEUTROS ABS: 1.8 10*3/uL (ref 1.5–6.5)
Platelets: 297 10*3/uL (ref 140–400)
RBC: 3.93 10*6/uL — ABNORMAL LOW (ref 4.20–5.82)
RDW: 13.9 % (ref 11.0–14.6)
WBC: 4 10*3/uL (ref 4.0–10.3)
nRBC: 0 % (ref 0–0)

## 2013-08-08 LAB — MAGNESIUM (CC13): Magnesium: 2 mg/dl (ref 1.5–2.5)

## 2013-08-08 LAB — PHOSPHORUS: PHOSPHORUS: 3.9 mg/dL (ref 2.3–4.6)

## 2013-08-08 MED ORDER — FAMOTIDINE IN NACL 20-0.9 MG/50ML-% IV SOLN
INTRAVENOUS | Status: AC
  Filled 2013-08-08: qty 50

## 2013-08-08 MED ORDER — HEPARIN SOD (PORK) LOCK FLUSH 100 UNIT/ML IV SOLN
500.0000 [IU] | Freq: Once | INTRAVENOUS | Status: AC
  Administered 2013-08-08: 500 [IU] via INTRAVENOUS
  Filled 2013-08-08: qty 5

## 2013-08-08 MED ORDER — OXYCODONE-ACETAMINOPHEN 5-325 MG PO TABS
ORAL_TABLET | ORAL | Status: AC
  Filled 2013-08-08: qty 1

## 2013-08-08 MED ORDER — SODIUM CHLORIDE 0.9 % IJ SOLN
10.0000 mL | INTRAMUSCULAR | Status: DC | PRN
  Administered 2013-08-08: 10 mL via INTRAVENOUS
  Filled 2013-08-08: qty 10

## 2013-08-08 MED ORDER — SODIUM CHLORIDE 0.9 % IV SOLN
Freq: Once | INTRAVENOUS | Status: AC
  Administered 2013-08-08: 15:00:00 via INTRAVENOUS

## 2013-08-08 MED ORDER — OXYCODONE-ACETAMINOPHEN 5-325 MG PO TABS
1.0000 | ORAL_TABLET | Freq: Once | ORAL | Status: AC
  Administered 2013-08-08: 1 via ORAL

## 2013-08-08 MED ORDER — FAMOTIDINE IN NACL 20-0.9 MG/50ML-% IV SOLN
20.0000 mg | Freq: Once | INTRAVENOUS | Status: AC
  Administered 2013-08-08: 20 mg via INTRAVENOUS

## 2013-08-08 NOTE — Patient Instructions (Signed)
Dehydration, Adult Dehydration is when you lose more fluids from the body than you take in. Vital organs like the kidneys, brain, and heart cannot function without a proper amount of fluids and salt. Any loss of fluids from the body can cause dehydration.  CAUSES   Vomiting.  Diarrhea.  Excessive sweating.  Excessive urine output.  Fever. SYMPTOMS  Mild dehydration  Thirst.  Dry lips.  Slightly dry mouth. Moderate dehydration  Very dry mouth.  Sunken eyes.  Skin does not bounce back quickly when lightly pinched and released.  Dark urine and decreased urine production.  Decreased tear production.  Headache. Severe dehydration  Very dry mouth.  Extreme thirst.  Rapid, weak pulse (more than 100 beats per minute at rest).  Cold hands and feet.  Not able to sweat in spite of heat and temperature.  Rapid breathing.  Blue lips.  Confusion and lethargy.  Difficulty being awakened.  Minimal urine production.  No tears. DIAGNOSIS  Your caregiver will diagnose dehydration based on your symptoms and your exam. Blood and urine tests will help confirm the diagnosis. The diagnostic evaluation should also identify the cause of dehydration. TREATMENT  Treatment of mild or moderate dehydration can often be done at home by increasing the amount of fluids that you drink. It is best to drink small amounts of fluid more often. Drinking too much at one time can make vomiting worse. Refer to the home care instructions below. Severe dehydration needs to be treated at the hospital where you will probably be given intravenous (IV) fluids that contain water and electrolytes. HOME CARE INSTRUCTIONS   Ask your caregiver about specific rehydration instructions.  Drink enough fluids to keep your urine clear or pale yellow.  Drink small amounts frequently if you have nausea and vomiting.  Eat as you normally do.  Avoid:  Foods or drinks high in sugar.  Carbonated  drinks.  Juice.  Extremely hot or cold fluids.  Drinks with caffeine.  Fatty, greasy foods.  Alcohol.  Tobacco.  Overeating.  Gelatin desserts.  Wash your hands well to avoid spreading bacteria and viruses.  Only take over-the-counter or prescription medicines for pain, discomfort, or fever as directed by your caregiver.  Ask your caregiver if you should continue all prescribed and over-the-counter medicines.  Keep all follow-up appointments with your caregiver. SEEK MEDICAL CARE IF:  You have abdominal pain and it increases or stays in one area (localizes).  You have a rash, stiff neck, or severe headache.  You are irritable, sleepy, or difficult to awaken.  You are weak, dizzy, or extremely thirsty. SEEK IMMEDIATE MEDICAL CARE IF:   You are unable to keep fluids down or you get worse despite treatment.  You have frequent episodes of vomiting or diarrhea.  You have blood or green matter (bile) in your vomit.  You have blood in your stool or your stool looks black and tarry.  You have not urinated in 6 to 8 hours, or you have only urinated a small amount of very dark urine.  You have a fever.  You faint. MAKE SURE YOU:   Understand these instructions.  Will watch your condition.  Will get help right away if you are not doing well or get worse. Document Released: 12/19/2004 Document Revised: 03/13/2011 Document Reviewed: 08/08/2010 Exit[employer] Patient Information 2015 [employer]. This information is not intended to replace advice given to you by your health care provider. Make sure you discuss any questions you have with your health care   provider.  

## 2013-08-08 NOTE — Progress Notes (Signed)
Followup completed with patient.  Patient reports tolerating Osmolite 1.5 via feeding tube at 65 mL an hour for approximately 14 hours.  This provides approximately 1350 calories, 56 g protein, 686 mL free water.  Patient continues to eat very little by mouth.  Patient has not tried [employer].  Labs on 8/7: Magnesium 2.0, potassium 4.4, glucose 122.  Phosphorus not available.  Patient continues to meet criteria for severe malnutrition.  Nutrition diagnosis: Inadequate oral intake continues.  Estimated nutrition needs: 1500-1750 calories, 64-80 g protein, 1.7 L fluid.  Intervention: Patient will continue to advance tube feeding slowly to provide additional calories and protein.  His niece states she will advance feedings by 5 mL daily over the weekend if patient tolerates. She is not comfortable with increasing rate any faster.  Educated patient to be sure to consume 8 ounces of water 3 times a day by mouth or through tube. Questions answered and teach back method used.  Monitoring, evaluation, goals: Patient is tolerating tube feeding advancement and has thus far avoided refeeding syndrome.  Will continue to work to increase tube feedings to meet greater than 90% of estimated nutrition needs.  Next visit: To be scheduled.  **Disclaimer: This note was dictated with voice recognition software. Similar sounding words can inadvertently be transcribed and this note may contain transcription errors which may not have been corrected upon publication of note.**

## 2013-08-08 NOTE — Telephone Encounter (Signed)
lvm about todays appts. Also that we will be scheduling weekly labs per Dr Boyce Medici note. We will ask Dr Juliann Mule when his next MD appt is when he gets back into town 8/12.

## 2013-08-09 ENCOUNTER — Telehealth: Payer: Self-pay | Admitting: Hematology

## 2013-08-09 NOTE — Telephone Encounter (Signed)
Labs/nut/ov per 08/07 POF, mailed sch to pt, req chemo to be added.Marland Kitchen..KJ

## 2013-08-11 ENCOUNTER — Ambulatory Visit
Admission: RE | Admit: 2013-08-11 | Discharge: 2013-08-11 | Disposition: A | Discharge status: Home / Self Care | Payer: BC Managed Care – PPO | Source: Ambulatory Visit | Attending: Radiation Oncology | Admitting: Radiation Oncology

## 2013-08-11 ENCOUNTER — Encounter: Payer: Self-pay | Admitting: Radiation Oncology

## 2013-08-11 ENCOUNTER — Telehealth: Payer: Self-pay | Admitting: *Deleted

## 2013-08-11 ENCOUNTER — Other Ambulatory Visit: Payer: Self-pay | Admitting: Medical Oncology

## 2013-08-11 VITALS — BP 115/73 | HR 94 | Temp 98.5°F | Resp 16

## 2013-08-11 DIAGNOSIS — C7931 Secondary malignant neoplasm of brain: Secondary | ICD-10-CM

## 2013-08-11 DIAGNOSIS — Z923 Personal history of irradiation: Secondary | ICD-10-CM

## 2013-08-11 DIAGNOSIS — Z510 Encounter for antineoplastic radiation therapy: Secondary | ICD-10-CM | POA: Diagnosis not present

## 2013-08-11 HISTORY — DX: Personal history of irradiation: Z92.3

## 2013-08-11 NOTE — Progress Notes (Signed)
  Radiation Oncology         [phone] ________________________________  Stereotactic Treatment Procedure Note  Name: Devin Gonzalez MRN: [id number]  Date: 08/11/2013  DOB: 12-Jul-1955  SPECIAL TREATMENT PROCEDURE   ICD-9-CM  1. Brain metastasis 198.3    3D TREATMENT PLANNING AND DOSIMETRY:  The patient's radiation plan was reviewed and approved by neurosurgery and radiation oncology prior to treatment.  It showed 3-dimensional radiation distributions overlaid onto the planning CT/MRI image set.  The [facility] for the target structures as well as the organs at risk were reviewed. The documentation of the 3D plan and dosimetry are filed in the radiation oncology EMR.  NARRATIVE:  Devin Gonzalez was brought to the TrueBeam stereotactic radiation treatment machine and placed supine on the CT couch. The head frame was applied, and the patient was set up for stereotactic radiosurgery.  Neurosurgery was present for the set-up and delivery  SIMULATION VERIFICATION:  In the couch zero-angle position, the patient underwent Exactrac imaging using the Brainlab system with orthogonal KV images.  These were carefully aligned and repeated to confirm treatment position for each of the isocenters.  The Exactrac snap film verification was repeated at each couch angle.  SPECIAL TREATMENT PROCEDURE: Devin Gonzalez received stereotactic radiosurgery to the following targets: Right frontal 51mm target was treated using 3 Circular Arcs to a prescription dose of 20 Gy.  ExacTrac Snap verification was performed for each couch angle.  This constitutes a special treatment procedure due to the ablative dose delivered and the technical nature of treatment.  This highly technical modality of treatment ensures that the ablative dose is centered on the patient's tumor while sparing normal tissues from excessive dose and risk of detrimental effects.  STEREOTACTIC TREATMENT MANAGEMENT:  Following delivery, the patient was  transported to nursing in stable condition and monitored for possible acute effects.  Vital signs were recorded BP 115/73  Pulse 94  Temp(Src) 98.5 F (36.9 C) (Oral)  Resp 16  SpO2 100%. The patient tolerated treatment without significant acute effects, and was discharged to home in stable condition.    PLAN: Follow-up in one month.  ________________________________   Eppie Gibson, MD

## 2013-08-11 NOTE — Progress Notes (Signed)
Patient completed SRS Brain, was in dressing room 1 at nursing station,  Patient's vitals done, no c/o pain, nausea, vision changes,dizziness, is wrapped in warm blankets, offered fluids,snack,patient declined, will continue to monitor 1 hour 1:27 PM'

## 2013-08-11 NOTE — Op Note (Signed)
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 Eppie Gibson (radiation oncology) prior to treatment.  It showed 3-dimensional radiation distributions overlaid onto the planning CT/MRI image set.  The [facility] for the target structures as well as the organs at risk were reviewed. The documentation of the 3D plan and dosimetry are filed in the radiation oncology EMR.  NARRATIVE:  Devin Gonzalez was brought to the TrueBeam stereotactic radiation treatment machine and placed supine on the CT couch. The head frame was applied, and the patient was set up for stereotactic radiosurgery.  I was present for the set-up and delivery.  SIMULATION VERIFICATION:  In the couch zero-angle position, the patient underwent Exactrac imaging using the Brainlab system with orthogonal KV images.  These were carefully aligned and repeated to confirm treatment position for each of the isocenters.  The Exactrac snap film verification was repeated at each couch angle.  SPECIAL TREATMENT PROCEDURE: Devin Gonzalez received stereotactic radiosurgery to the following targets: Right frontal target was treated using 3 Circular Arcs to a prescription dose of 20 Gy.  ExacTrac registration was performed for each couch angle.  The 83.6% isodose line was prescribed.  STEREOTACTIC TREATMENT MANAGEMENT:  Following delivery, the patient was transported to nursing in stable condition and monitored for possible acute effects.  Vital signs were recorded BP 115/73  Pulse 94  Temp(Src) 98.5 F (36.9 C) (Oral)  Resp 16  SpO2 100%. The patient tolerated treatment without significant acute effects,  and was discharged to home in stable condition.    PLAN: Follow-up in one month.

## 2013-08-11 NOTE — Telephone Encounter (Signed)
Per staff message and POF I have scheduled appts. Advised scheduler of appts. JMW  

## 2013-08-11 NOTE — Progress Notes (Signed)
Devin Gonzalez completed SRS Brain Irradiation at ~ 1:15pm.  Denies any headaches, blurred vision, dizziness nor nausea.  Area cleansed with 0.9 Sterile NS, dried, then redressed with a split sponge and secured to prevent pulling.  Educated,if at all possible to not place tape directly on the PEG tubing and he and family member stated understanding.  Received instructions on after care form [name], [name]  Sitting in  A recliner for observation prior to discharge home.

## 2013-08-13 ENCOUNTER — Other Ambulatory Visit: Payer: Self-pay

## 2013-08-13 ENCOUNTER — Other Ambulatory Visit: Payer: BC Managed Care – PPO

## 2013-08-13 ENCOUNTER — Other Ambulatory Visit: Payer: Self-pay | Admitting: Internal Medicine

## 2013-08-13 ENCOUNTER — Ambulatory Visit: Payer: BC Managed Care – PPO | Admitting: Nutrition

## 2013-08-13 ENCOUNTER — Ambulatory Visit: Payer: BC Managed Care – PPO

## 2013-08-13 ENCOUNTER — Ambulatory Visit (HOSPITAL_BASED_OUTPATIENT_CLINIC_OR_DEPARTMENT_OTHER): Payer: BC Managed Care – PPO

## 2013-08-13 ENCOUNTER — Other Ambulatory Visit (HOSPITAL_BASED_OUTPATIENT_CLINIC_OR_DEPARTMENT_OTHER): Payer: BC Managed Care – PPO

## 2013-08-13 ENCOUNTER — Other Ambulatory Visit: Payer: Self-pay | Admitting: Medical Oncology

## 2013-08-13 VITALS — Wt 100.2 lb

## 2013-08-13 DIAGNOSIS — C155 Malignant neoplasm of lower third of esophagus: Secondary | ICD-10-CM

## 2013-08-13 DIAGNOSIS — Z95828 Presence of other vascular implants and grafts: Secondary | ICD-10-CM

## 2013-08-13 LAB — CBC WITH DIFFERENTIAL/PLATELET
BASO%: 0.9 % (ref 0.0–2.0)
BASOS ABS: 0.1 10*3/uL (ref 0.0–0.1)
EOS%: 2.1 % (ref 0.0–7.0)
Eosinophils Absolute: 0.2 10*3/uL (ref 0.0–0.5)
HCT: 25.8 % — ABNORMAL LOW (ref 38.4–49.9)
HEMOGLOBIN: 8.6 g/dL — AB (ref 13.0–17.1)
LYMPH#: 1 10*3/uL (ref 0.9–3.3)
LYMPH%: 13.1 % — ABNORMAL LOW (ref 14.0–49.0)
MCH: 29 pg (ref 27.2–33.4)
MCHC: 33.5 g/dL (ref 32.0–36.0)
MCV: 86.5 fL (ref 79.3–98.0)
MONO#: 0.2 10*3/uL (ref 0.1–0.9)
MONO%: 3.1 % (ref 0.0–14.0)
NEUT%: 80.8 % — ABNORMAL HIGH (ref 39.0–75.0)
NEUTROS ABS: 6.4 10*3/uL (ref 1.5–6.5)
Platelets: 284 10*3/uL (ref 140–400)
RBC: 2.98 10*6/uL — AB (ref 4.20–5.82)
RDW: 13.3 % (ref 11.0–14.6)
WBC: 7.9 10*3/uL (ref 4.0–10.3)

## 2013-08-13 LAB — COMPREHENSIVE METABOLIC PANEL
ALT: 27 U/L (ref 0–53)
AST: 36 U/L (ref 0–37)
Albumin: 3.2 g/dL — ABNORMAL LOW (ref 3.5–5.2)
Alkaline Phosphatase: 79 U/L (ref 39–117)
BUN: 10 mg/dL (ref 6–23)
CALCIUM: 9.8 mg/dL (ref 8.4–10.5)
CHLORIDE: 97 meq/L (ref 96–112)
CO2: 28 mEq/L (ref 19–32)
Creatinine, Ser: 0.7 mg/dL (ref 0.50–1.35)
Glucose, Bld: 142 mg/dL — ABNORMAL HIGH (ref 70–99)
POTASSIUM: 4.8 meq/L (ref 3.5–5.3)
Sodium: 135 mEq/L (ref 135–145)
Total Bilirubin: 0.3 mg/dL (ref 0.2–1.2)
Total Protein: 7 g/dL (ref 6.0–8.3)

## 2013-08-13 LAB — MAGNESIUM: Magnesium: 1.8 mg/dL (ref 1.5–2.5)

## 2013-08-13 MED ORDER — OXYCODONE HCL 10 MG PO TABS: 10.0000 mg | ORAL_TABLET | Freq: Four times a day (QID) | ORAL | Status: DC | PRN

## 2013-08-13 MED ORDER — SODIUM CHLORIDE 0.9 % IV SOLN
Freq: Once | INTRAVENOUS | Status: AC
  Administered 2013-08-13: 13:00:00 via INTRAVENOUS

## 2013-08-13 MED ORDER — LORAZEPAM 0.5 MG PO TABS
0.5000 mg | ORAL_TABLET | Freq: Four times a day (QID) | ORAL | Status: AC | PRN
Start: 2013-08-13 — End: ?

## 2013-08-13 MED ORDER — BACLOFEN 10 MG PO TABS: 10.0000 mg | ORAL_TABLET | Freq: Three times a day (TID) | ORAL | Status: AC

## 2013-08-13 MED ORDER — HEPARIN SOD (PORK) LOCK FLUSH 100 UNIT/ML IV SOLN
500.0000 [IU] | Freq: Once | INTRAVENOUS | Status: AC
  Administered 2013-08-13: 500 [IU] via INTRAVENOUS
  Filled 2013-08-13: qty 5

## 2013-08-13 MED ORDER — SODIUM CHLORIDE 0.9 % IJ SOLN
10.0000 mL | INTRAMUSCULAR | Status: DC | PRN
  Administered 2013-08-13: 10 mL via INTRAVENOUS
  Filled 2013-08-13: qty 10

## 2013-08-13 NOTE — Progress Notes (Signed)
Nutrition followup completed with patient in infusion.  Patient is tolerating Osmolite 1.5 via feeding tube at 75 mL an hour for approximately 14 hours.  This provides approximately 1575 calories, 66 g protein, 800 mL free water.  Patient has not been eating much by mouth but is requesting a snack in infusion today.  Patient complains of constipation with poor results from drinking magnesium citrate. Patient also complains of burning in his lower esophagus, upper stomach area.  Labs on August 12: Magnesium 1.8, glucose 142, sodium 135, and potassium 4.8.  Nutrition needs: 1500-1750 calories, 64-80 g protein, 1.7 L fluid. Osmolite 1.5 at 75 mL an hour for 14 hours provides 1575 calories, 66 g protein, 800 mL free water.  Weight improved and documented as 100.4 pounds increased from 97 pounds.  Nutrition diagnosis: Inadequate oral intake continues.  Intervention: Patient educated to try to increase oral intake of liquids and soft solids as tolerated. Referred patient to physician for instructions on constipation, and burning in esophagus and stomach. Encouraged patient to continue current tube feedings to meet greater than 90% of estimated nutrition needs. Questions answered.  Teach back method used.  Monitoring, evaluation, goals: Patient has tolerated slow tube feeding advancement.  Patient to continue tube feedings and oral intake to meet greater than 90% of estimated needs.  Goal is weight gain.  Next visit: Monday, August 24.  In the infusion room.    **Disclaimer: This note was dictated with voice recognition software. Similar sounding words can inadvertently be transcribed and this note may contain transcription errors which may not have been corrected upon publication of note.**

## 2013-08-13 NOTE — Patient Instructions (Signed)

## 2013-08-13 NOTE — Patient Instructions (Signed)

## 2013-08-14 ENCOUNTER — Telehealth: Payer: Self-pay

## 2013-08-14 NOTE — Telephone Encounter (Signed)
Katlyn RN from [facility] called earlier in day. [facility] is discharging pt from services. He is managing his PEG tube well. Pt is somewhat constipated. Pt has questions about xeloda. Pt was wondering about a 2nd opinion. LVM that he can increase his Senna to 2-3/day as needed. He can use enema. He can use other OTC laxatives if he has one that works best for him.  He can let us know what physician he wants to see for a 2nd opinion and we will be glad to fax his papers to them. Will call again tomorrow to answer questions about xeloda.

## 2013-08-17 ENCOUNTER — Emergency Department (HOSPITAL_COMMUNITY)
Admission: EM | Admit: 2013-08-17 | Discharge: 2013-08-18 | Disposition: A | Discharge status: Home / Self Care | Payer: BC Managed Care – PPO | Attending: Emergency Medicine | Admitting: Emergency Medicine

## 2013-08-17 ENCOUNTER — Encounter (HOSPITAL_COMMUNITY): Payer: Self-pay | Admitting: Emergency Medicine

## 2013-08-17 ENCOUNTER — Emergency Department (HOSPITAL_COMMUNITY): Payer: BC Managed Care – PPO

## 2013-08-17 DIAGNOSIS — C159 Malignant neoplasm of esophagus, unspecified: Secondary | ICD-10-CM | POA: Insufficient documentation

## 2013-08-17 DIAGNOSIS — K2100 Gastro-esophageal reflux disease with esophagitis, without bleeding: Secondary | ICD-10-CM | POA: Insufficient documentation

## 2013-08-17 DIAGNOSIS — Z87891 Personal history of nicotine dependence: Secondary | ICD-10-CM | POA: Diagnosis not present

## 2013-08-17 DIAGNOSIS — Z5189 Encounter for other specified aftercare: Secondary | ICD-10-CM | POA: Diagnosis not present

## 2013-08-17 DIAGNOSIS — Z931 Gastrostomy status: Secondary | ICD-10-CM | POA: Diagnosis not present

## 2013-08-17 DIAGNOSIS — R1013 Epigastric pain: Secondary | ICD-10-CM | POA: Diagnosis not present

## 2013-08-17 DIAGNOSIS — Z79899 Other long term (current) drug therapy: Secondary | ICD-10-CM | POA: Insufficient documentation

## 2013-08-17 DIAGNOSIS — Z87448 Personal history of other diseases of urinary system: Secondary | ICD-10-CM | POA: Diagnosis not present

## 2013-08-17 DIAGNOSIS — Z8739 Personal history of other diseases of the musculoskeletal system and connective tissue: Secondary | ICD-10-CM | POA: Insufficient documentation

## 2013-08-17 DIAGNOSIS — R112 Nausea with vomiting, unspecified: Secondary | ICD-10-CM | POA: Diagnosis not present

## 2013-08-17 DIAGNOSIS — Z85841 Personal history of malignant neoplasm of brain: Secondary | ICD-10-CM | POA: Insufficient documentation

## 2013-08-17 LAB — CBC WITH DIFFERENTIAL/PLATELET
BASOS PCT: 0 % (ref 0–1)
Basophils Absolute: 0 10*3/uL (ref 0.0–0.1)
Eosinophils Absolute: 0 10*3/uL (ref 0.0–0.7)
Eosinophils Relative: 1 % (ref 0–5)
HEMATOCRIT: 31.7 % — AB (ref 39.0–52.0)
HEMOGLOBIN: 11.1 g/dL — AB (ref 13.0–17.0)
Lymphocytes Relative: 17 % (ref 12–46)
Lymphs Abs: 1 10*3/uL (ref 0.7–4.0)
MCH: 28.9 pg (ref 26.0–34.0)
MCHC: 35 g/dL (ref 30.0–36.0)
MCV: 82.6 fL (ref 78.0–100.0)
MONOS PCT: 11 % (ref 3–12)
Monocytes Absolute: 0.6 10*3/uL (ref 0.1–1.0)
NEUTROS PCT: 71 % (ref 43–77)
Neutro Abs: 4.2 10*3/uL (ref 1.7–7.7)
Platelets: 256 10*3/uL (ref 150–400)
RBC: 3.84 MIL/uL — AB (ref 4.22–5.81)
RDW: 13.5 % (ref 11.5–15.5)
WBC: 5.9 10*3/uL (ref 4.0–10.5)

## 2013-08-17 LAB — COMPREHENSIVE METABOLIC PANEL
ALBUMIN: 3.3 g/dL — AB (ref 3.5–5.2)
ALK PHOS: 85 U/L (ref 39–117)
ALT: 37 U/L (ref 0–53)
AST: 44 U/L — ABNORMAL HIGH (ref 0–37)
Anion gap: 14 (ref 5–15)
BILIRUBIN TOTAL: 0.4 mg/dL (ref 0.3–1.2)
BUN: 10 mg/dL (ref 6–23)
CO2: 26 mEq/L (ref 19–32)
Calcium: 9.9 mg/dL (ref 8.4–10.5)
Chloride: 98 mEq/L (ref 96–112)
Creatinine, Ser: 0.65 mg/dL (ref 0.50–1.35)
GFR calc Af Amer: >90 mL/min (ref >90)
GFR calc non Af Amer: >90 mL/min (ref >90)
GLUCOSE: 128 mg/dL — AB (ref 70–99)
POTASSIUM: 4 meq/L (ref 3.7–5.3)
Sodium: 138 mEq/L (ref 137–147)
Total Protein: 7.3 g/dL (ref 6.0–8.3)

## 2013-08-17 LAB — I-STAT TROPONIN, ED: Troponin i, poc: 0.01 ng/mL (ref 0.00–0.08)

## 2013-08-17 LAB — LIPASE, BLOOD: Lipase: 14 U/L (ref 11–59)

## 2013-08-17 MED ORDER — HYDROMORPHONE HCL PF 1 MG/ML IJ SOLN
1.0000 mg | Freq: Once | INTRAMUSCULAR | Status: AC
  Administered 2013-08-17: 1 mg via INTRAVENOUS
  Filled 2013-08-17: qty 1

## 2013-08-17 MED ORDER — PROMETHAZINE HCL 25 MG/ML IJ SOLN
12.5000 mg | Freq: Once | INTRAMUSCULAR | Status: AC
  Administered 2013-08-17: 12.5 mg via INTRAVENOUS
  Filled 2013-08-17: qty 1

## 2013-08-17 MED ORDER — IOHEXOL 300 MG/ML  SOLN
100.0000 mL | Freq: Once | INTRAMUSCULAR | Status: AC | PRN
  Administered 2013-08-17: 100 mL via INTRAVENOUS

## 2013-08-17 MED ORDER — ONDANSETRON 8 MG PO TBDP: 8.0000 mg | ORAL_TABLET | Freq: Three times a day (TID) | ORAL | Status: AC | PRN

## 2013-08-17 MED ORDER — ONDANSETRON HCL 4 MG/2ML IJ SOLN
4.0000 mg | Freq: Once | INTRAMUSCULAR | Status: AC
  Administered 2013-08-17: 4 mg via INTRAVENOUS
  Filled 2013-08-17: qty 2

## 2013-08-17 MED ORDER — SODIUM CHLORIDE 0.9 % IV BOLUS (SEPSIS)
1000.0000 mL | Freq: Once | INTRAVENOUS | Status: AC
  Administered 2013-08-17: 1000 mL via INTRAVENOUS

## 2013-08-17 MED ORDER — IOHEXOL 300 MG/ML  SOLN
50.0000 mL | Freq: Once | INTRAMUSCULAR | Status: AC | PRN
  Administered 2013-08-17: 50 mL via ORAL

## 2013-08-17 NOTE — ED Notes (Signed)
Pt presents with family, pt c/o n/v with upper chest and throat burning onset today. G tube in place, clean dry bandage, power port noted. Pt appears weak, hiccups noted.

## 2013-08-17 NOTE — ED Notes (Signed)
Pt to radiology via stretcher, pain 4/10

## 2013-08-17 NOTE — ED Notes (Signed)
Pt reports nausea and emesis since yesterday. Pt states that he has thrown up so much that his stomach has been aching and that his throat is burning. Pt reports having a history of GERD. Pt reports having esophageal cancer and is currently taking oral chemotherapy. Pt is A/O x4 and vitals are WDL.

## 2013-08-17 NOTE — ED Provider Notes (Signed)
CSN: [id number]     Arrival date & time 08/17/13  2014 History   First MD Initiated Contact with Patient 08/17/13 2026     Chief Complaint  Patient presents with  . Emesis     (Consider location/radiation/quality/duration/timing/severity/associated sxs/prior Treatment) HPI This is a 58 year old male with esophageal cancer and G-tube in place who comes in today complaining of nausea and vomiting with some upper abdominal discomfort. He has recently had chemotherapy. He does take things down through his G-tube but has continued to have some vomiting.  He denies fever, chills, or dyspnea.  Past Medical History  Diagnosis Date  . Allergy   . Hemorrhoids   . Back pain     right side  . Bunion, right foot   . Left tennis elbow   . Osteoarthritis     hands, knees, right hip  . Prostatitis   . Diverticulosis 2013  . Adenomatous colon polyp   . ED (erectile dysfunction)   . Tobacco abuse   . Reflux esophagitis   . Nonspecific elevation of levels of transaminase or lactic acid dehydrogenase (LDH)   . S/P radiation therapy 06-10-13 to 06-23-13                                6-9 to 06-23-13                                                                                    Distal Esophagus / 30 Gy in 10 fractions                  . Status post chemotherapy     Started ([facility]) consisting of  Epirubicin 50 mg/m2 IV, Cisplatin 60 mg/m2 IV on day #1, Fluorouracil 200 mg/m2 per day IV continuous infusion over 24 hours daily on days 1-21 cycled every 21 days for six months on 06/27/2013  . Brain metastases 06/19/13     Right Frontal Lobe   Past Surgical History  Procedure Laterality Date  . Cyst on testicle Right   . Appendectomy    . Tonsillectomy    . Knee surgery Bilateral     arthroscopic surg /  bil knees  . Fatty tumor Right     removal forearm  . Colonoscopy w/ polypectomy    . Sphincterotomy  08/28/2011    Procedure: SPHINCTEROTOMY;  Surgeon: Joyice Faster. Cornett, MD;  Location: [place];   Service: General;  Laterality: N/A;  Lateral internal sphincterotomy  . Hemorrhoid surgery  08/28/2011    Procedure: HEMORRHOIDECTOMY;  Surgeon: Joyice Faster. Cornett, MD;  Location: [place];  Service: General;  Laterality: N/A;  ,possible hemorrhoidectomy  . Examination under anesthesia  12/13/2011    Procedure: EXAM UNDER ANESTHESIA;  Surgeon: Marcello Moores A. Cornett, MD;  Location: [place];  Service: General;  Laterality: N/A;  . Anal fissure repair  2013    Cornett   Family History  Problem Relation Age of Onset  . Lung cancer Mother   . Cancer Father   . Lung cancer Brother   . Stroke Brother    History  Substance Use  Topics  . Smoking status: Former Smoker -- 0.50 packs/day for 20 years    Types: Cigarettes    Quit date: 06/26/2013  . Smokeless tobacco: Never Used  . Alcohol Use: Yes     Comment: stopped 7 weeks ago 06/28/13    Review of Systems  All other systems reviewed and are negative.     Allergies  Review of patient's allergies indicates no known allergies.  Home Medications   Prior to Admission medications   Medication Sig Start Date End Date Taking? Authorizing Provider  aspirin-sod bicarb-citric acid (ALKA-SELTZER) 325 MG TBEF tablet Take 325 mg by mouth every 6 (six) hours as needed (acid reflux).   Yes Historical Provider, MD  baclofen (LIORESAL) 10 MG tablet Take 1 tablet (10 mg total) by mouth 3 (three) times daily. 08/13/13  Yes Concha Norway, MD  capecitabine (XELODA) 500 MG tablet Take 1 tablet (500 mg total) by mouth 2 (two) times daily after a meal. 08/06/13 08/27/13 Yes Concha Norway, MD  DiphenhydrAMINE HCl (BENADRYL ALLERGY PO) Take 25 mg by mouth at bedtime as needed (sleep/itching).    Yes Historical Provider, MD  dronabinol (MARINOL) 2.5 MG capsule Take 1 capsule (2.5 mg total) by mouth 2 (two) times daily before a meal. 07/30/13  Yes Concha Norway, MD  feeding supplement (BOOST HIGH PROTEIN) LIQD Take 1 Container by mouth daily.    Yes Historical Provider, MD   fluconazole (DIFLUCAN) 10 MG/ML suspension Take 100 mg by mouth daily.   Yes Historical Provider, MD  lidocaine (XYLOCAINE) 2 % solution Use as directed 20 mLs in the mouth or throat 3 (three) times daily as needed for mouth pain.   Yes Historical Provider, MD  lidocaine-prilocaine (EMLA) cream Apply 1 application topically as needed (for port access).   Yes Historical Provider, MD  LORazepam (ATIVAN) 0.5 MG tablet Take 1 tablet (0.5 mg total) by mouth every 6 (six) hours as needed (Nausea or vomiting). 08/13/13  Yes Concha Norway, MD  montelukast (SINGULAIR) 10 MG tablet Take 10 mg by mouth daily.  06/05/13  Yes Historical Provider, MD  nicotine (NICODERM CQ - DOSED IN MG/24 HOURS) 14 mg/24hr patch Place 14 mg onto the skin daily.   Yes Historical Provider, MD  Nutritional Supplements (FEEDING SUPPLEMENT, OSMOLITE 1.5 CAL,) LIQD Begin Osmolite 1.5 at 40 ml/hr from 6 pm to 6 am with 60 cc free water before and after continuous feeding. Increase 10 ml/hr daily to goal of 90 ml/hr for 12 hours.  Drink or flush tube with additional 240 ml free water TID. SEND feeding pump but DO NOT SEND formula.  Patient has formula. 07/31/13  Yes Concha Norway, MD  nystatin (MYCOSTATIN) 100000 UNIT/ML suspension Take 5 mLs by mouth 2 (two) times daily.    Yes Historical Provider, MD  ondansetron (ZOFRAN) 8 MG tablet Take 1 tablet by mouth every 8 (eight) hours as needed for nausea.  07/18/13  Yes Historical Provider, MD  Oxycodone HCl 10 MG TABS Take 10 mg by mouth every 6 (six) hours as needed (pain).   Yes Historical Provider, MD  pantoprazole (PROTONIX) 40 MG tablet Take 40 mg by mouth daily.   Yes Historical Provider, MD  prochlorperazine (COMPAZINE) 10 MG tablet Take 10 mg by mouth every 6 (six) hours as needed for nausea.  07/18/13  Yes Historical Provider, MD  sodium phosphate (FLEET) 7-19 GM/118ML ENEM Place 133 mLs (1 enema total) rectally once. Please administer enema rectally x 1 08/04/13  Yes Eppie Gibson, MD  sucralfate (CARAFATE) 1 GM/10ML suspension Take 1 g by mouth 4 (four) times daily -  with meals and at bedtime.   Yes Historical Provider, MD  Tetrahydrozoline HCl (VISINE OP) Place 2-4 drops into both eyes 2 (two) times daily as needed. For dry eyes   Yes Historical Provider, MD   BP 118/59  Pulse 98  Temp(Src) 98.7 F (37.1 C) (Oral)  Resp 10  SpO2 98% Physical Exam  Nursing note and vitals reviewed. Constitutional: He is oriented to person, place, and time. He appears well-developed and well-nourished.  HENT:  Head: Normocephalic and atraumatic.  Right Ear: External ear normal.  Left Ear: External ear normal.  Nose: Nose normal.  Mouth/Throat: Oropharynx is clear and moist.  Eyes: Conjunctivae and EOM are normal. Pupils are equal, round, and reactive to light.  Neck: Normal range of motion. Neck supple.  Cardiovascular: Normal rate, regular rhythm, normal heart sounds and intact distal pulses.   Pulmonary/Chest: Effort normal and breath sounds normal.  Abdominal: Soft. Bowel sounds are normal.  Mild epigastric tenderness palpation with G-tube in place  Musculoskeletal: Normal range of motion.  Neurological: He is alert and oriented to person, place, and time. He has normal reflexes.  Skin: Skin is warm and dry.  Psychiatric: He has a normal mood and affect. His behavior is normal. Judgment and thought content normal.    ED Course  Procedures (including critical care time) Labs Review Labs Reviewed  CBC WITH DIFFERENTIAL - Abnormal; Notable for the following:    RBC 3.84 (*)    Hemoglobin 11.1 (*)    HCT 31.7 (*)    All other components within normal limits  COMPREHENSIVE METABOLIC PANEL - Abnormal; Notable for the following:    Glucose, Bld 128 (*)    Albumin 3.3 (*)    AST 44 (*)    All other components within normal limits  LIPASE, BLOOD  I-STAT TROPOININ, ED    Imaging Review Dg Chest 2 View  08/17/2013   CLINICAL DATA:  Chest pain.  Esophageal cancer.  EXAM:  CHEST  2 VIEW  COMPARISON:  Chest CT 06/09/2013.  FINDINGS: The cardiac silhouette, mediastinal and hilar contours are within normal limits. The power port is in good position. No complicating features. The lungs are clear. No worrisome pulmonary lesions. No pleural effusion or pneumothorax. The bony thorax is intact.  IMPRESSION: No acute cardiopulmonary findings.   Electronically Signed   By: Kalman Jewels M.D.   On: 08/17/2013 21:33   Ct Abdomen Pelvis W Contrast  08/17/2013   CLINICAL DATA:  Nausea, vomiting. History of esophageal cancer and gastroesophageal reflux disease.  EXAM: CT ABDOMEN AND PELVIS WITH CONTRAST  TECHNIQUE: Multidetector CT imaging of the abdomen and pelvis was performed using the standard protocol following bolus administration of intravenous contrast.  CONTRAST:  99mL OMNIPAQUE IOHEXOL 300 MG/ML SOLN, 132mL OMNIPAQUE IOHEXOL 300 MG/ML SOLN  COMPARISON:  CT of the chest, abdomen and pelvis May 19, 2013  FINDINGS: LUNG BASES: Included view of the lung bases are clear. Visualized heart and pericardium are unremarkable.  SOLID ORGANS: The liver, spleen, gallbladder, pancreas and adrenal glands are unremarkable.  GASTROINTESTINAL TRACT: Again noted is thickened irregular is distal esophagus corresponding to patient's known neoplasm. The stomach, small and large bowel are normal in course and caliber without inflammatory changes. Gastrostomy tube in place, retaining balloon within the proximal stomach. Colonic diverticulosis with contrast and multiple diverticula. Moderate amount of retained large bowel stool. The appendix is not discretely  identified, however there are no inflammatory changes in the right lower quadrant.  KIDNEYS/ URINARY TRACT: Kidneys are orthotopic, demonstrating symmetric enhancement. No nephrolithiasis, hydronephrosis or solid renal masses. The unopacified ureters are normal in course and caliber. Delayed imaging through the kidneys demonstrates symmetric prompt  contrast excretion within the proximal urinary collecting system. Urinary bladder is partially distended and unremarkable.  PERITONEUM/RETROPERITONEUM: No intraperitoneal free fluid nor free air. Aortoiliac vessels are normal in course and caliber, severe calcific atherosclerosis. No lymphadenopathy by CT size criteria. Sub cm necrotic gastrohepatic lymph nodes again seen, similar ciliary access subcentimeter lymph nodes. Internal reproductive organs are unremarkable.  SOFT TISSUE/OSSEOUS STRUCTURES: Nonsuspicious.  IMPRESSION: Re- demonstration of distal esophageal mass, similar to slightly improved, corresponding to patient's known esophageal cancer with similar small gastrohepatic and celiac axis lymph nodes. Interval placement of gastrostomy tube, intraluminal retaining lobe.  Colonic diverticulosis without CT findings of acute diverticulitis. Moderate amount of retained large bowel stool.   Electronically Signed   By: Elon Alas   On: 08/17/2013 23:30     EKG Interpretation   Date/Time:  Sunday August 17 2013 20:34:16 EDT Ventricular Rate:  86 PR Interval:  89 QRS Duration: 80 QT Interval:  361 QTC Calculation: 432 R Axis:   71 Text Interpretation:  Sinus rhythm Short PR interval Anteroseptal infarct,  age indeterminate Confirmed by Shaquela Weichert MD, Berley Gambrell [id number]) on 08/17/2013  11:58:17 PM      MDM   Final diagnoses:  Non-intractable vomiting with nausea, vomiting of unspecified type    Patient with esophageal cancer who presented today with nausea and vomiting. Workup here reveals no evidence of acute intra-abdominal source of vomiting. He has not vomited here and has taken  his oral contrast. He is out of Zofran at home and will be given a prescription for this. He has been hydrated here and return precautions and followup were discussed the patient and his wife and they voice understanding.    Shaune Pollack, MD 08/18/13 1134

## 2013-08-17 NOTE — ED Notes (Signed)
Last chemo tx Thursday, also began taking PO Chemo tx recently

## 2013-08-17 NOTE — Discharge Instructions (Signed)

## 2013-08-18 MED ORDER — HEPARIN SOD (PORK) LOCK FLUSH 100 UNIT/ML IV SOLN
500.0000 [IU] | Freq: Once | INTRAVENOUS | Status: AC
  Administered 2013-08-18: 500 [IU]
  Filled 2013-08-18: qty 5

## 2013-08-19 NOTE — Progress Notes (Signed)
  Radiation Oncology         [phone] ________________________________  Name: Devin Gonzalez MRN: [id number]  Date: 08/11/2013  DOB: 02/28/1955  End of Treatment Note  Diagnosis:   Brain metastasis, esophageal cancer    Indication for treatment:  palliative       Radiation treatment dates:   08/11/13  Site/dose/Beams/energy:   Right frontal 64mm target was treated using 3 Circular Arcs to a prescription dose of 20 Gy with 6MV FFF photons, stereotactic radiosurgery  Narrative: The patient tolerated radiation treatment relatively well.    Plan: The patient has completed radiation treatment. The patient will return to radiation oncology clinic for routine followup in one month. I advised them to call or return sooner if they have any questions or concerns related to their recovery or treatment.  -----------------------------------  Eppie Gibson, MD

## 2013-08-20 ENCOUNTER — Telehealth: Payer: Self-pay

## 2013-08-20 ENCOUNTER — Telehealth: Payer: Self-pay | Admitting: Internal Medicine

## 2013-08-20 DIAGNOSIS — C155 Malignant neoplasm of lower third of esophagus: Secondary | ICD-10-CM

## 2013-08-20 NOTE — Telephone Encounter (Signed)
, °

## 2013-08-20 NOTE — Telephone Encounter (Signed)
Pt was in radonc and mentioned he has not seen a dentist and Dr Juliann Mule wanted him to see a dentist. He is taking his chemo. He does have tooth pain. He is worried about expense. S/w Dr Juliann Mule and referral to dr Enrique Sack placed. POF completed. Spoke with niece brenda and let her know.

## 2013-08-21 ENCOUNTER — Inpatient Hospital Stay (HOSPITAL_COMMUNITY)
Admission: EM | Admit: 2013-08-21 | Discharge: 2013-08-28 | DRG: 391 | Disposition: A | Discharge status: Home / Self Care | Payer: BC Managed Care – PPO | Attending: Internal Medicine | Admitting: Internal Medicine

## 2013-08-21 ENCOUNTER — Ambulatory Visit: Payer: BC Managed Care – PPO

## 2013-08-21 ENCOUNTER — Encounter (HOSPITAL_COMMUNITY): Payer: Self-pay | Admitting: Emergency Medicine

## 2013-08-21 DIAGNOSIS — C155 Malignant neoplasm of lower third of esophagus: Secondary | ICD-10-CM | POA: Diagnosis present

## 2013-08-21 DIAGNOSIS — L299 Pruritus, unspecified: Secondary | ICD-10-CM | POA: Diagnosis not present

## 2013-08-21 DIAGNOSIS — Z515 Encounter for palliative care: Secondary | ICD-10-CM

## 2013-08-21 DIAGNOSIS — K209 Esophagitis, unspecified without bleeding: Secondary | ICD-10-CM

## 2013-08-21 DIAGNOSIS — R1310 Dysphagia, unspecified: Secondary | ICD-10-CM | POA: Diagnosis present

## 2013-08-21 DIAGNOSIS — K5900 Constipation, unspecified: Secondary | ICD-10-CM | POA: Diagnosis present

## 2013-08-21 DIAGNOSIS — Z87891 Personal history of nicotine dependence: Secondary | ICD-10-CM

## 2013-08-21 DIAGNOSIS — R1115 Cyclical vomiting syndrome unrelated to migraine: Principal | ICD-10-CM | POA: Diagnosis present

## 2013-08-21 DIAGNOSIS — R42 Dizziness and giddiness: Secondary | ICD-10-CM

## 2013-08-21 DIAGNOSIS — C7931 Secondary malignant neoplasm of brain: Secondary | ICD-10-CM

## 2013-08-21 DIAGNOSIS — H9191 Unspecified hearing loss, right ear: Secondary | ICD-10-CM

## 2013-08-21 DIAGNOSIS — R1110 Vomiting, unspecified: Secondary | ICD-10-CM

## 2013-08-21 DIAGNOSIS — Z923 Personal history of irradiation: Secondary | ICD-10-CM

## 2013-08-21 DIAGNOSIS — D6959 Other secondary thrombocytopenia: Secondary | ICD-10-CM

## 2013-08-21 DIAGNOSIS — R066 Hiccough: Secondary | ICD-10-CM | POA: Diagnosis present

## 2013-08-21 DIAGNOSIS — C7952 Secondary malignant neoplasm of bone marrow: Secondary | ICD-10-CM

## 2013-08-21 DIAGNOSIS — K219 Gastro-esophageal reflux disease without esophagitis: Secondary | ICD-10-CM | POA: Diagnosis present

## 2013-08-21 DIAGNOSIS — T451X5A Adverse effect of antineoplastic and immunosuppressive drugs, initial encounter: Secondary | ICD-10-CM | POA: Diagnosis present

## 2013-08-21 DIAGNOSIS — Z931 Gastrostomy status: Secondary | ICD-10-CM

## 2013-08-21 DIAGNOSIS — R638 Other symptoms and signs concerning food and fluid intake: Secondary | ICD-10-CM

## 2013-08-21 DIAGNOSIS — B370 Candidal stomatitis: Secondary | ICD-10-CM | POA: Diagnosis present

## 2013-08-21 DIAGNOSIS — R630 Anorexia: Secondary | ICD-10-CM

## 2013-08-21 DIAGNOSIS — Z801 Family history of malignant neoplasm of trachea, bronchus and lung: Secondary | ICD-10-CM

## 2013-08-21 DIAGNOSIS — K6289 Other specified diseases of anus and rectum: Secondary | ICD-10-CM

## 2013-08-21 DIAGNOSIS — D701 Agranulocytosis secondary to cancer chemotherapy: Secondary | ICD-10-CM

## 2013-08-21 DIAGNOSIS — R112 Nausea with vomiting, unspecified: Secondary | ICD-10-CM | POA: Diagnosis present

## 2013-08-21 DIAGNOSIS — K2100 Gastro-esophageal reflux disease with esophagitis, without bleeding: Secondary | ICD-10-CM | POA: Diagnosis present

## 2013-08-21 DIAGNOSIS — C7951 Secondary malignant neoplasm of bone: Secondary | ICD-10-CM | POA: Diagnosis present

## 2013-08-21 DIAGNOSIS — C7949 Secondary malignant neoplasm of other parts of nervous system: Secondary | ICD-10-CM

## 2013-08-21 DIAGNOSIS — R1013 Epigastric pain: Secondary | ICD-10-CM

## 2013-08-21 DIAGNOSIS — G893 Neoplasm related pain (acute) (chronic): Secondary | ICD-10-CM | POA: Diagnosis present

## 2013-08-21 DIAGNOSIS — E43 Unspecified severe protein-calorie malnutrition: Secondary | ICD-10-CM | POA: Diagnosis present

## 2013-08-21 DIAGNOSIS — Z79899 Other long term (current) drug therapy: Secondary | ICD-10-CM

## 2013-08-21 DIAGNOSIS — Z823 Family history of stroke: Secondary | ICD-10-CM

## 2013-08-21 DIAGNOSIS — Z681 Body mass index (BMI) 19 or less, adult: Secondary | ICD-10-CM

## 2013-08-21 DIAGNOSIS — H9190 Unspecified hearing loss, unspecified ear: Secondary | ICD-10-CM | POA: Diagnosis present

## 2013-08-21 DIAGNOSIS — E860 Dehydration: Secondary | ICD-10-CM | POA: Diagnosis present

## 2013-08-21 MED ORDER — HYDROMORPHONE HCL PF 1 MG/ML IJ SOLN
1.0000 mg | Freq: Once | INTRAMUSCULAR | Status: AC
  Administered 2013-08-21: 1 mg via INTRAVENOUS
  Filled 2013-08-21: qty 1

## 2013-08-21 MED ORDER — SODIUM CHLORIDE 0.9 % IV BOLUS (SEPSIS)
1000.0000 mL | Freq: Once | INTRAVENOUS | Status: AC
  Administered 2013-08-21: 1000 mL via INTRAVENOUS

## 2013-08-21 MED ORDER — ONDANSETRON HCL 4 MG/2ML IJ SOLN
4.0000 mg | Freq: Once | INTRAMUSCULAR | Status: AC
  Administered 2013-08-21: 4 mg via INTRAVENOUS
  Filled 2013-08-21: qty 2

## 2013-08-21 NOTE — ED Provider Notes (Signed)
CSN: [id number]     Arrival date & time 08/21/13  2228 History   First MD Initiated Contact with Patient 08/21/13 2325     Chief Complaint  Patient presents with  . Chest Pain  . Abdominal Pain  . Emesis     (Consider location/radiation/quality/duration/timing/severity/associated sxs/prior Treatment) HPI Comments: Patient is a 58 year old male past medical history significant for esophageal cancer with metastases status post chemotherapy and radiation therapy presented to the emergency room for acute onset nausea, nonbloody nonbilious vomiting, abdominal pain that began last evening. Patient states he is now having burning chest pain that he describes as reflux. He is unable to tolerate his home medications for symptom control. Denies any fevers, shortness of breath, diarrhea.   Past Medical History  Diagnosis Date  . Allergy   . Hemorrhoids   . Back pain     right side  . Bunion, right foot   . Left tennis elbow   . Osteoarthritis     hands, knees, right hip  . Prostatitis   . Diverticulosis 2013  . Adenomatous colon polyp   . ED (erectile dysfunction)   . Tobacco abuse   . Reflux esophagitis   . Nonspecific elevation of levels of transaminase or lactic acid dehydrogenase (LDH)   . S/P radiation therapy 06-10-13 to 06-23-13                                6-9 to 06-23-13                                                                                    Distal Esophagus / 30 Gy in 10 fractions                  . Status post chemotherapy     Started ([facility]) consisting of  Epirubicin 50 mg/m2 IV, Cisplatin 60 mg/m2 IV on day #1, Fluorouracil 200 mg/m2 per day IV continuous infusion over 24 hours daily on days 1-21 cycled every 21 days for six months on 06/27/2013  . Brain metastases 06/19/13     Right Frontal Lobe   Past Surgical History  Procedure Laterality Date  . Cyst on testicle Right   . Appendectomy    . Tonsillectomy    . Knee surgery Bilateral     arthroscopic surg /  bil  knees  . Fatty tumor Right     removal forearm  . Colonoscopy w/ polypectomy    . Sphincterotomy  08/28/2011    Procedure: SPHINCTEROTOMY;  Surgeon: Joyice Faster. Cornett, MD;  Location: [place];  Service: General;  Laterality: N/A;  Lateral internal sphincterotomy  . Hemorrhoid surgery  08/28/2011    Procedure: HEMORRHOIDECTOMY;  Surgeon: Joyice Faster. Cornett, MD;  Location: [place];  Service: General;  Laterality: N/A;  ,possible hemorrhoidectomy  . Examination under anesthesia  12/13/2011    Procedure: EXAM UNDER ANESTHESIA;  Surgeon: Marcello Moores A. Cornett, MD;  Location: [place];  Service: General;  Laterality: N/A;  . Anal fissure repair  2013    Cornett   Family History  Problem Relation Age of Onset  .  Lung cancer Mother   . Cancer Father   . Lung cancer Brother   . Stroke Brother    History  Substance Use Topics  . Smoking status: Former Smoker -- 0.50 packs/day for 20 years    Types: Cigarettes    Quit date: 06/26/2013  . Smokeless tobacco: Never Used  . Alcohol Use: No     Comment: stopped 7 weeks ago 06/28/13    Review of Systems  Constitutional: Positive for appetite change and fatigue.  Respiratory: Negative for shortness of breath.   Cardiovascular: Positive for chest pain.  Gastrointestinal: Positive for nausea, vomiting and abdominal pain. Negative for diarrhea.  All other systems reviewed and are negative.     Allergies  Review of patient's allergies indicates no known allergies.  Home Medications   Prior to Admission medications   Medication Sig Start Date End Date Taking? Authorizing Provider  aspirin-sod bicarb-citric acid (ALKA-SELTZER) 325 MG TBEF tablet Take 325 mg by mouth every 6 (six) hours as needed (acid reflux).   Yes Historical Provider, MD  baclofen (LIORESAL) 10 MG tablet Take 1 tablet (10 mg total) by mouth 3 (three) times daily. 08/13/13  Yes Concha Norway, MD  capecitabine (XELODA) 500 MG tablet Take 1 tablet (500 mg total) by mouth 2 (two) times daily  after a meal. 08/06/13 08/27/13 Yes Concha Norway, MD  DiphenhydrAMINE HCl (BENADRYL ALLERGY PO) Take 25 mg by mouth at bedtime as needed (sleep/itching).    Yes Historical Provider, MD  dronabinol (MARINOL) 2.5 MG capsule Take 1 capsule (2.5 mg total) by mouth 2 (two) times daily before a meal. 07/30/13  Yes Concha Norway, MD  feeding supplement (BOOST HIGH PROTEIN) LIQD Take 1 Container by mouth daily.    Yes Historical Provider, MD  fluconazole (DIFLUCAN) 10 MG/ML suspension Take 100 mg by mouth daily.   Yes Historical Provider, MD  lidocaine (XYLOCAINE) 2 % solution Use as directed 20 mLs in the mouth or throat 3 (three) times daily as needed for mouth pain.   Yes Historical Provider, MD  lidocaine-prilocaine (EMLA) cream Apply 1 application topically as needed (for port access).   Yes Historical Provider, MD  LORazepam (ATIVAN) 0.5 MG tablet Take 1 tablet (0.5 mg total) by mouth every 6 (six) hours as needed (Nausea or vomiting). 08/13/13  Yes Concha Norway, MD  montelukast (SINGULAIR) 10 MG tablet Take 10 mg by mouth daily.  06/05/13  Yes Historical Provider, MD  nicotine (NICODERM CQ - DOSED IN MG/24 HOURS) 14 mg/24hr patch Place 14 mg onto the skin daily.   Yes Historical Provider, MD  Nutritional Supplements (FEEDING SUPPLEMENT, OSMOLITE 1.5 CAL,) LIQD Begin Osmolite 1.5 at 40 ml/hr from 6 pm to 6 am with 60 cc free water before and after continuous feeding. Increase 10 ml/hr daily to goal of 90 ml/hr for 12 hours.  Drink or flush tube with additional 240 ml free water TID. SEND feeding pump but DO NOT SEND formula.  Patient has formula. 07/31/13  Yes Concha Norway, MD  nystatin (MYCOSTATIN) 100000 UNIT/ML suspension Take 5 mLs by mouth 2 (two) times daily.    Yes Historical Provider, MD  ondansetron (ZOFRAN ODT) 8 MG disintegrating tablet Take 1 tablet (8 mg total) by mouth every 8 (eight) hours as needed for nausea or vomiting. 08/17/13  Yes Shaune Pollack, MD  Oxycodone HCl 10 MG TABS Take 10 mg by mouth  every 6 (six) hours as needed (pain).   Yes Historical Provider, MD  pantoprazole (T[place]y)  40 MG tablet Take 40 mg by mouth daily.   Yes Historical Provider, MD  prochlorperazine (COMPAZINE) 10 MG tablet Take 10 mg by mouth every 6 (six) hours as needed for nausea.  07/18/13  Yes Historical Provider, MD  sodium phosphate (FLEET) 7-19 GM/118ML ENEM Place 133 mLs (1 enema total) rectally once. Please administer enema rectally x 1 08/04/13  Yes Eppie Gibson, MD  sucralfate (CARAFATE) 1 GM/10ML suspension Take 1 g by mouth 4 (four) times daily -  with meals and at bedtime.   Yes Historical Provider, MD  Tetrahydrozoline HCl (VISINE OP) Place 2-4 drops into both eyes 2 (two) times daily as needed. For dry eyes   Yes Historical Provider, MD   BP 164/74  Pulse 89  Temp(Src) 98 F (36.7 C) (Oral)  Resp 16  Ht 5\' 4"  (1.626 m)  Wt 92 lb 13 oz (42.1 kg)  BMI 15.92 kg/m2  SpO2 100% Physical Exam  Nursing note and vitals reviewed. Constitutional: He is oriented to person, place, and time. He appears well-developed and well-nourished. No distress.  Patient actively vomiting in room. No bloody or bilious content noted.   HENT:  Head: Normocephalic and atraumatic.  Right Ear: External ear normal.  Left Ear: External ear normal.  Nose: Nose normal.  Mouth/Throat: Oropharynx is clear and moist.  Eyes: Conjunctivae are normal.  Neck: Normal range of motion. Neck supple.  Cardiovascular: Regular rhythm and normal heart sounds.  Tachycardia present.   Pulmonary/Chest: Effort normal and breath sounds normal. No respiratory distress.  Abdominal: Soft. Bowel sounds are normal. He exhibits no distension. There is tenderness (generalized). There is no rebound and no guarding.  PEG tube w/o surrounding erythema or warmth.   Musculoskeletal: Normal range of motion.  Neurological: He is alert and oriented to person, place, and time.  Skin: Skin is warm and dry. He is not diaphoretic.  Psychiatric: He has a  normal mood and affect.    ED Course  Procedures (including critical care time) Medications  0.9 %  sodium chloride infusion ( Intravenous New Bag/Given 08/22/13 0420)  ondansetron (ZOFRAN) injection 4 mg (not administered)  sucralfate (CARAFATE) 1 GM/10ML suspension 1 g (not administered)  nystatin (MYCOSTATIN) 100000 UNIT/ML suspension 500,000 Units (not administered)  pantoprazole (PROTONIX) injection 40 mg (not administered)  fluconazole (DIFLUCAN) IVPB 100 mg (not administered)  ondansetron (ZOFRAN) injection 4 mg (not administered)  ondansetron (ZOFRAN) tablet 4 mg (not administered)    Or  ondansetron (ZOFRAN) injection 4 mg (not administered)  albuterol (PROVENTIL) (2.5 MG/3ML) 0.083% nebulizer solution 2.5 mg (not administered)  guaiFENesin-dextromethorphan (ROBITUSSIN DM) 100-10 MG/5ML syrup 5 mL (not administered)  heparin injection 5,000 Units (not administered)  sodium chloride 0.9 % injection 3 mL (not administered)  dextrose 5 %-0.9 % sodium chloride infusion (not administered)  acetaminophen (TYLENOL) tablet 650 mg (not administered)    Or  acetaminophen (TYLENOL) suppository 650 mg (not administered)  oxyCODONE (Oxy IR/ROXICODONE) immediate release tablet 5 mg (not administered)  morphine 2 MG/ML injection 1 mg (not administered)  ondansetron (ZOFRAN) injection 4 mg (4 mg Intravenous Given 08/21/13 2350)  sodium chloride 0.9 % bolus 1,000 mL (0 mLs Intravenous Stopped 08/22/13 0143)  HYDROmorphone (DILAUDID) injection 1 mg (1 mg Intravenous Given 08/21/13 2350)  sodium chloride 0.9 % bolus 1,000 mL (1,000 mLs Intravenous New Bag/Given 08/22/13 0214)    Labs Review Labs Reviewed  COMPREHENSIVE METABOLIC PANEL - Abnormal; Notable for the following:    Potassium 3.6 (*)    Glucose,  Bld 125 (*)    GFR calc non Af Amer 79 (*)    Anion gap 17 (*)    All other components within normal limits  CBC WITH DIFFERENTIAL - Abnormal; Notable for the following:    RBC 4.15 (*)     Hemoglobin 11.9 (*)    HCT 35.1 (*)    Platelets 409 (*)    Monocytes Relative 17 (*)    Monocytes Absolute 1.2 (*)    All other components within normal limits  URINALYSIS, ROUTINE W REFLEX MICROSCOPIC - Abnormal; Notable for the following:    Color, Urine AMBER (*)    APPearance TURBID (*)    pH 8.5 (*)    Leukocytes, UA TRACE (*)    All other components within normal limits  I-STAT CG4 LACTIC ACID, ED - Abnormal; Notable for the following:    Lactic Acid, Venous 2.69 (*)    All other components within normal limits  URINE CULTURE  CULTURE, BLOOD (ROUTINE X 2)  CULTURE, BLOOD (ROUTINE X 2)  LIPASE, BLOOD  TROPONIN I  URINE MICROSCOPIC-ADD ON  BASIC METABOLIC PANEL  CBC  TROPONIN I  TROPONIN I  TROPONIN I    Imaging Review Dg Abd 1 View  08/22/2013   CLINICAL DATA:  Chest pain and abdominal pain.  EXAM: ABDOMEN - 1 VIEW  COMPARISON:  Abdominal CT 08/17/2013  FINDINGS: A pigtail catheter retention loop overlaps the stomach. T tacks likely present at this location.  Prominent volume of stool in the proximal colon. Numerous distal colonic diverticula with retained barium. No concerning intra-abdominal mass effect or calcification. There is extensive aortic atherosclerotic disease. Lung bases are clear.  IMPRESSION: 1. Possible constipation 2. Numerous colonic diverticula.   Electronically Signed   By: Jorje Guild M.D.   On: 08/22/2013 03:30   Dg Chest Portable 1 View  08/22/2013   CLINICAL DATA:  Chest pain and shortness of Breath.  EXAM: PORTABLE CHEST - 1 VIEW  COMPARISON:  08/17/2013.  FINDINGS: The power port is stable. The cardiac silhouette, mediastinal and hilar contours are normal and unchanged. The lungs are clear. No pleural effusion.  IMPRESSION: No acute cardiopulmonary findings.   Electronically Signed   By: Kalman Jewels M.D.   On: 08/22/2013 00:55     EKG Interpretation None      MDM   Final diagnoses:  Intractable vomiting with nausea, vomiting of  unspecified type    Filed Vitals:   08/22/13 0344  BP: 164/74  Pulse: 89  Temp: 98 F (36.7 C)  Resp: 16   Afebrile, NAD, non-toxic appearing, AAOx4. I have reviewed nursing notes, vital signs, and all appropriate lab and imaging results for this patient. Patient with intractable nausea and vomiting. Abdomen soft/non-tender/non-distended. Bowel sounds present. No evidence of infection surrounding  PEG tube. IV fluids and Zofran given with improvement of symptoms. Tachycardia resolved after fluid administration. Likely related to dehydration. CT scan review from Sunday. Monday abdomen obtained with possible constipation. Will admit patient for further management and evaluation. Patient d/w with Dr. Sharol Given, agrees with plan.       Harlow Mares, PA-C 08/22/13 [phone]

## 2013-08-21 NOTE — ED Notes (Signed)
Pt presents with c/o vomiting, abdominal pain, and chest pain. Pt reports the chest pain started earlier today and he has also been vomiting for the past 6 hours. Pt is a cancer patient and is currently undergoing treatment. Pt is also very tachycardic at this time, 137BPM.

## 2013-08-22 ENCOUNTER — Emergency Department (HOSPITAL_COMMUNITY): Payer: BC Managed Care – PPO

## 2013-08-22 ENCOUNTER — Inpatient Hospital Stay (HOSPITAL_COMMUNITY): Payer: BC Managed Care – PPO

## 2013-08-22 DIAGNOSIS — Z87891 Personal history of nicotine dependence: Secondary | ICD-10-CM | POA: Diagnosis not present

## 2013-08-22 DIAGNOSIS — E860 Dehydration: Secondary | ICD-10-CM | POA: Diagnosis present

## 2013-08-22 DIAGNOSIS — K2100 Gastro-esophageal reflux disease with esophagitis, without bleeding: Secondary | ICD-10-CM

## 2013-08-22 DIAGNOSIS — B370 Candidal stomatitis: Secondary | ICD-10-CM | POA: Diagnosis present

## 2013-08-22 DIAGNOSIS — L299 Pruritus, unspecified: Secondary | ICD-10-CM | POA: Diagnosis not present

## 2013-08-22 DIAGNOSIS — C155 Malignant neoplasm of lower third of esophagus: Secondary | ICD-10-CM

## 2013-08-22 DIAGNOSIS — K209 Esophagitis, unspecified without bleeding: Secondary | ICD-10-CM

## 2013-08-22 DIAGNOSIS — C7951 Secondary malignant neoplasm of bone: Secondary | ICD-10-CM

## 2013-08-22 DIAGNOSIS — Z515 Encounter for palliative care: Secondary | ICD-10-CM

## 2013-08-22 DIAGNOSIS — Z923 Personal history of irradiation: Secondary | ICD-10-CM | POA: Diagnosis not present

## 2013-08-22 DIAGNOSIS — C7949 Secondary malignant neoplasm of other parts of nervous system: Secondary | ICD-10-CM

## 2013-08-22 DIAGNOSIS — G893 Neoplasm related pain (acute) (chronic): Secondary | ICD-10-CM | POA: Diagnosis present

## 2013-08-22 DIAGNOSIS — E43 Unspecified severe protein-calorie malnutrition: Secondary | ICD-10-CM | POA: Diagnosis present

## 2013-08-22 DIAGNOSIS — C7952 Secondary malignant neoplasm of bone marrow: Secondary | ICD-10-CM | POA: Diagnosis present

## 2013-08-22 DIAGNOSIS — Z681 Body mass index (BMI) 19 or less, adult: Secondary | ICD-10-CM | POA: Diagnosis not present

## 2013-08-22 DIAGNOSIS — R1115 Cyclical vomiting syndrome unrelated to migraine: Principal | ICD-10-CM

## 2013-08-22 DIAGNOSIS — Z823 Family history of stroke: Secondary | ICD-10-CM | POA: Diagnosis not present

## 2013-08-22 DIAGNOSIS — K5900 Constipation, unspecified: Secondary | ICD-10-CM | POA: Diagnosis present

## 2013-08-22 DIAGNOSIS — T451X5A Adverse effect of antineoplastic and immunosuppressive drugs, initial encounter: Secondary | ICD-10-CM | POA: Diagnosis present

## 2013-08-22 DIAGNOSIS — Z931 Gastrostomy status: Secondary | ICD-10-CM | POA: Diagnosis not present

## 2013-08-22 DIAGNOSIS — R1310 Dysphagia, unspecified: Secondary | ICD-10-CM | POA: Diagnosis present

## 2013-08-22 DIAGNOSIS — C7931 Secondary malignant neoplasm of brain: Secondary | ICD-10-CM | POA: Diagnosis present

## 2013-08-22 DIAGNOSIS — Z801 Family history of malignant neoplasm of trachea, bronchus and lung: Secondary | ICD-10-CM | POA: Diagnosis not present

## 2013-08-22 DIAGNOSIS — H9190 Unspecified hearing loss, unspecified ear: Secondary | ICD-10-CM | POA: Diagnosis present

## 2013-08-22 DIAGNOSIS — R630 Anorexia: Secondary | ICD-10-CM

## 2013-08-22 DIAGNOSIS — Z79899 Other long term (current) drug therapy: Secondary | ICD-10-CM | POA: Diagnosis not present

## 2013-08-22 DIAGNOSIS — R066 Hiccough: Secondary | ICD-10-CM | POA: Diagnosis present

## 2013-08-22 DIAGNOSIS — R112 Nausea with vomiting, unspecified: Secondary | ICD-10-CM | POA: Diagnosis present

## 2013-08-22 LAB — CBC WITH DIFFERENTIAL/PLATELET
BASOS ABS: 0 10*3/uL (ref 0.0–0.1)
BASOS PCT: 0 % (ref 0–1)
EOS PCT: 0 % (ref 0–5)
Eosinophils Absolute: 0 10*3/uL (ref 0.0–0.7)
HCT: 35.1 % — ABNORMAL LOW (ref 39.0–52.0)
Hemoglobin: 11.9 g/dL — ABNORMAL LOW (ref 13.0–17.0)
Lymphocytes Relative: 21 % (ref 12–46)
Lymphs Abs: 1.4 10*3/uL (ref 0.7–4.0)
MCH: 28.7 pg (ref 26.0–34.0)
MCHC: 33.9 g/dL (ref 30.0–36.0)
MCV: 84.6 fL (ref 78.0–100.0)
Monocytes Absolute: 1.2 10*3/uL — ABNORMAL HIGH (ref 0.1–1.0)
Monocytes Relative: 17 % — ABNORMAL HIGH (ref 3–12)
Neutro Abs: 4.1 10*3/uL (ref 1.7–7.7)
Neutrophils Relative %: 62 % (ref 43–77)
PLATELETS: 409 10*3/uL — AB (ref 150–400)
RBC: 4.15 MIL/uL — ABNORMAL LOW (ref 4.22–5.81)
RDW: 14.5 % (ref 11.5–15.5)
WBC: 6.7 10*3/uL (ref 4.0–10.5)

## 2013-08-22 LAB — COMPREHENSIVE METABOLIC PANEL
ALK PHOS: 78 U/L (ref 39–117)
ALT: 32 U/L (ref 0–53)
AST: 35 U/L (ref 0–37)
Albumin: 3.8 g/dL (ref 3.5–5.2)
Anion gap: 17 — ABNORMAL HIGH (ref 5–15)
BUN: 11 mg/dL (ref 6–23)
CALCIUM: 10.3 mg/dL (ref 8.4–10.5)
CO2: 25 meq/L (ref 19–32)
Chloride: 99 mEq/L (ref 96–112)
Creatinine, Ser: 1.03 mg/dL (ref 0.50–1.35)
GFR, EST NON AFRICAN AMERICAN: 79 mL/min — AB (ref >90)
GLUCOSE: 125 mg/dL — AB (ref 70–99)
Potassium: 3.6 mEq/L — ABNORMAL LOW (ref 3.7–5.3)
SODIUM: 141 meq/L (ref 137–147)
Total Bilirubin: 0.5 mg/dL (ref 0.3–1.2)
Total Protein: 7.9 g/dL (ref 6.0–8.3)

## 2013-08-22 LAB — BASIC METABOLIC PANEL
Anion gap: 9 (ref 5–15)
BUN: 10 mg/dL (ref 6–23)
CHLORIDE: 107 meq/L (ref 96–112)
CO2: 27 meq/L (ref 19–32)
Calcium: 8.9 mg/dL (ref 8.4–10.5)
Creatinine, Ser: 0.88 mg/dL (ref 0.50–1.35)
GFR calc non Af Amer: >90 mL/min (ref >90)
GLUCOSE: 117 mg/dL — AB (ref 70–99)
Potassium: 4.2 mEq/L (ref 3.7–5.3)
Sodium: 143 mEq/L (ref 137–147)

## 2013-08-22 LAB — LIPASE, BLOOD: Lipase: 14 U/L (ref 11–59)

## 2013-08-22 LAB — TROPONIN I
Troponin I: <0.3 ng/mL (ref <0.30)
Troponin I: <0.3 ng/mL (ref <0.30)
Troponin I: <0.3 ng/mL (ref <0.30)

## 2013-08-22 LAB — URINALYSIS, ROUTINE W REFLEX MICROSCOPIC
Bilirubin Urine: NEGATIVE
Glucose, UA: NEGATIVE mg/dL
HGB URINE DIPSTICK: NEGATIVE
KETONES UR: NEGATIVE mg/dL
NITRITE: NEGATIVE
PROTEIN: NEGATIVE mg/dL
SPECIFIC GRAVITY, URINE: 1.026 (ref 1.005–1.030)
UROBILINOGEN UA: 1 mg/dL (ref 0.0–1.0)
pH: 8.5 — ABNORMAL HIGH (ref 5.0–8.0)

## 2013-08-22 LAB — CBC
HCT: 28.9 % — ABNORMAL LOW (ref 39.0–52.0)
HEMOGLOBIN: 9.7 g/dL — AB (ref 13.0–17.0)
MCH: 28.7 pg (ref 26.0–34.0)
MCHC: 33.2 g/dL (ref 30.0–36.0)
MCV: 86.3 fL (ref 78.0–100.0)
Platelets: 337 10*3/uL (ref 150–400)
RBC: 3.35 MIL/uL — AB (ref 4.22–5.81)
RDW: 14.6 % (ref 11.5–15.5)
WBC: 5.1 10*3/uL (ref 4.0–10.5)

## 2013-08-22 LAB — URINE MICROSCOPIC-ADD ON

## 2013-08-22 LAB — I-STAT CG4 LACTIC ACID, ED: LACTIC ACID, VENOUS: 2.69 mmol/L — AB (ref 0.5–2.2)

## 2013-08-22 MED ORDER — SODIUM CHLORIDE 0.9 % IJ SOLN: 10.0000 mL | Freq: Two times a day (BID) | INTRAMUSCULAR | Status: DC

## 2013-08-22 MED ORDER — ACETAMINOPHEN 650 MG RE SUPP
650.0000 mg | Freq: Four times a day (QID) | RECTAL | Status: DC | PRN
Start: 2013-08-22 — End: 2013-08-28

## 2013-08-22 MED ORDER — OXYCODONE HCL 5 MG PO TABS: 5.0000 mg | ORAL_TABLET | ORAL | Status: DC | PRN

## 2013-08-22 MED ORDER — SODIUM CHLORIDE 0.9 % IJ SOLN: 3.0000 mL | Freq: Two times a day (BID) | INTRAMUSCULAR | Status: DC

## 2013-08-22 MED ORDER — MORPHINE SULFATE 2 MG/ML IJ SOLN
1.0000 mg | INTRAMUSCULAR | Status: DC | PRN
  Administered 2013-08-22 – 2013-08-23 (×5): 1 mg via INTRAVENOUS
  Filled 2013-08-22 (×6): qty 1

## 2013-08-22 MED ORDER — FLUCONAZOLE 100MG IVPB
100.0000 mg | Freq: Every day | INTRAVENOUS | Status: DC
  Administered 2013-08-22 – 2013-08-28 (×7): 100 mg via INTRAVENOUS
  Filled 2013-08-22 (×7): qty 50

## 2013-08-22 MED ORDER — PROMETHAZINE HCL 25 MG/ML IJ SOLN
25.0000 mg | Freq: Four times a day (QID) | INTRAMUSCULAR | Status: DC | PRN
  Administered 2013-08-22: 25 mg via INTRAVENOUS
  Filled 2013-08-22: qty 1

## 2013-08-22 MED ORDER — PANTOPRAZOLE SODIUM 40 MG IV SOLR
40.0000 mg | Freq: Two times a day (BID) | INTRAVENOUS | Status: DC
  Administered 2013-08-22 – 2013-08-28 (×13): 40 mg via INTRAVENOUS
  Filled 2013-08-22 (×14): qty 40

## 2013-08-22 MED ORDER — HEPARIN SODIUM (PORCINE) 5000 UNIT/ML IJ SOLN
5000.0000 [IU] | Freq: Three times a day (TID) | INTRAMUSCULAR | Status: DC
  Administered 2013-08-22 – 2013-08-28 (×19): 5000 [IU] via SUBCUTANEOUS
  Filled 2013-08-22 (×23): qty 1

## 2013-08-22 MED ORDER — ONDANSETRON HCL 4 MG/2ML IJ SOLN: 4.0000 mg | Freq: Three times a day (TID) | INTRAMUSCULAR | Status: DC | PRN

## 2013-08-22 MED ORDER — SODIUM CHLORIDE 0.9 % IJ SOLN
10.0000 mL | INTRAMUSCULAR | Status: DC | PRN
  Administered 2013-08-22 – 2013-08-28 (×4): 10 mL

## 2013-08-22 MED ORDER — DEXTROSE-NACL 5-0.9 % IV SOLN
INTRAVENOUS | Status: DC
  Administered 2013-08-22 – 2013-08-23 (×4): via INTRAVENOUS

## 2013-08-22 MED ORDER — SUCRALFATE 1 GM/10ML PO SUSP
1.0000 g | Freq: Three times a day (TID) | ORAL | Status: DC
  Administered 2013-08-22 – 2013-08-28 (×23): 1 g via ORAL
  Filled 2013-08-22 (×29): qty 10

## 2013-08-22 MED ORDER — ONDANSETRON HCL 4 MG/2ML IJ SOLN
4.0000 mg | Freq: Four times a day (QID) | INTRAMUSCULAR | Status: DC | PRN
  Filled 2013-08-22: qty 2

## 2013-08-22 MED ORDER — SODIUM CHLORIDE 0.9 % IV SOLN
INTRAVENOUS | Status: DC
  Administered 2013-08-22: 04:00:00 via INTRAVENOUS

## 2013-08-22 MED ORDER — NYSTATIN 100000 UNIT/ML MT SUSP
5.0000 mL | Freq: Two times a day (BID) | OROMUCOSAL | Status: DC
  Administered 2013-08-22 – 2013-08-28 (×12): 500000 [IU] via ORAL
  Filled 2013-08-22 (×14): qty 5

## 2013-08-22 MED ORDER — POLYETHYLENE GLYCOL 3350 17 G PO PACK
17.0000 g | PACK | Freq: Every day | ORAL | Status: DC
  Administered 2013-08-23 – 2013-08-27 (×4): 17 g
  Filled 2013-08-22 (×7): qty 1

## 2013-08-22 MED ORDER — METOCLOPRAMIDE HCL 5 MG/5ML PO SOLN
10.0000 mg | Freq: Three times a day (TID) | ORAL | Status: DC
  Administered 2013-08-22 – 2013-08-26 (×11): 10 mg
  Filled 2013-08-22 (×14): qty 10

## 2013-08-22 MED ORDER — BISACODYL 10 MG RE SUPP: 10.0000 mg | Freq: Every day | RECTAL | Status: DC | PRN

## 2013-08-22 MED ORDER — SODIUM CHLORIDE 0.9 % IV BOLUS (SEPSIS)
1000.0000 mL | Freq: Once | INTRAVENOUS | Status: AC
  Administered 2013-08-22: 1000 mL via INTRAVENOUS

## 2013-08-22 MED ORDER — ONDANSETRON HCL 4 MG PO TABS: 4.0000 mg | ORAL_TABLET | Freq: Four times a day (QID) | ORAL | Status: DC | PRN

## 2013-08-22 MED ORDER — SENNOSIDES 8.8 MG/5ML PO SYRP
10.0000 mL | ORAL_SOLUTION | Freq: Two times a day (BID) | ORAL | Status: DC
Start: 2013-08-22 — End: 2013-08-28
  Administered 2013-08-22 – 2013-08-28 (×12): 10 mL
  Filled 2013-08-22 (×13): qty 10

## 2013-08-22 MED ORDER — ACETAMINOPHEN 325 MG PO TABS
650.0000 mg | ORAL_TABLET | Freq: Four times a day (QID) | ORAL | Status: DC | PRN
Start: 2013-08-22 — End: 2013-08-28

## 2013-08-22 MED ORDER — GUAIFENESIN-DM 100-10 MG/5ML PO SYRP: 5.0000 mL | ORAL_SOLUTION | ORAL | Status: DC | PRN

## 2013-08-22 MED ORDER — ONDANSETRON HCL 4 MG/2ML IJ SOLN
4.0000 mg | Freq: Three times a day (TID) | INTRAMUSCULAR | Status: DC
  Administered 2013-08-22: 4 mg via INTRAVENOUS

## 2013-08-22 MED ORDER — ALBUTEROL SULFATE (2.5 MG/3ML) 0.083% IN NEBU: 2.5000 mg | INHALATION_SOLUTION | RESPIRATORY_TRACT | Status: DC | PRN

## 2013-08-22 NOTE — Progress Notes (Signed)
   Patient admitted earlier today by my colleague Dr. Sloan Leiter.  Patient seen and examined, it is reviewed.  Patient admitted to the hospital with intractable nausea and vomiting, negative KUB.  Suspect nausea/vomiting secondary to recent chemotherapy, will notify oncology.  Continue n.p.o., IV fluids, antiemetic therapy.  Birdie Hopes Pager: [phone] 08/22/2013, 12:03 PM

## 2013-08-22 NOTE — Consult Note (Signed)
Patient PN:Devin Gonzalez      DOB: [birth year]      VQM:[id number]     Consult Note from the Palliative Medicine Team at [facility] Requested by: Dr Devin Gonzalez     PCP: Devin Pao, MD Reason for Consultation: Refractory Nausea     Phone Number:[phone]  Assessment/Recommendations:  1.  Code Status: Full, did not explore today.     2. Goals of Care: Did not explore much today. He is still feeling poorly. Struggle with loss of independence (financially, physically) very apparent.  Will see if we can discuss further with him when feeling better.  Lives alone and wants to continue to try and maintain this.  He wishes to continue to explore treatment options with Dr Devin Gonzalez and possible 2nd opinion at [facility].  3. Symptom Management:   1. Nausea/Vomiting- Very typical for him to have after chemotherapy.  Constipation may also be playing a role. He is also being treated for oropharyngeal candida which can cause nausea.  He has a lot of nausea after tube feeds, so I will try a prokinetic anti-emetic such as reglan to see if this helps (also comes in liquid prep which he prefers).  Can continue PRN zofran/phenergan.  I think steroids would be reasonable to try if persists.   2. Dysphagia/Reflux- s/p PEG. Agree with protonix BID and carafate 3. Constipation- No BM for at least past 3 days. KUB suggestive as well. Will start senna liquid BID and miralax PRN. Bisacodyl supp PRN as well. This may be contributing to his nausea.  4. Abd Pain- mostly with nausea. On oxycodone PRN at home. Has prn oxy and morphine here. Doing a bit better. Monitor.  5. Adjustment D/O vs Depression- Will see how he is feeling with symptomatic improvement. May benefit from SSRI 6. Hiccups- appears he was on baclofen in past. Unclear if he was taking recently. If withdrawal symptoms would restart baclofen. Reglan should help with hiccups as well.   4. Psychosocial/Spiritual: Lives at home alone. Several  brothers/sisters in [place] whom he is close with. Also has niece who helps him.  No kids and not married.  Feels like he is in good place spiritually.    Brief HPI: 58 yo male with PMHx of 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  He also had recent PEG tube placement on 07/24/13 due to dysphagia in setting of his malignancy.  Started chemotherapy on 08/06/13 with multi-drug regimen of EOX. Developed N/V on 8/16 which eventually resolved on its own, but presented to ED last night with persistent N/V.    He reports episodes of nausea common place after chemotherapy.  Nausea tends to be worse after tube feeds as well. No other known triggers.  Nausea also associated with stomach pain and burning reflux sensation. Difficulty swallowing pills with burning sensation as well as dysphagia from cancer.  He also has not had BM for 3 days.  Able to take some liquid protein supplements PO but otherwise uses PEG. Throat sensitive to hot/cold liquids.  He also complains of fatigue and decreased energy.  Struggles with losing independence and feels down/depressed with how poorly he has been feeling.  He has occasional hiccups as well.   ROS: Full ROS negative unless otherwise mentioned above    PMH:  Past Medical History  Diagnosis Date  . Allergy   . Hemorrhoids   . Back pain     right side  .  Bunion, right foot   . Left tennis elbow   . Osteoarthritis     hands, knees, right hip  . Prostatitis   . Diverticulosis 2013  . Adenomatous colon polyp   . ED (erectile dysfunction)   . Tobacco abuse   . Reflux esophagitis   . Nonspecific elevation of levels of transaminase or lactic acid dehydrogenase (LDH)   . S/P radiation therapy 06-10-13 to 06-23-13                                6-9 to 06-23-13                                                                                    Distal Esophagus / 30 Gy in 10 fractions                  . Status post chemotherapy      Started (ECF) consisting of  Epirubicin 50 mg/m2 IV, Cisplatin 60 mg/m2 IV on day #1, Fluorouracil 200 mg/m2 per day IV continuous infusion over 24 hours daily on days 1-21 cycled every 21 days for six months on 06/27/2013  . Brain metastases 06/19/13     Right Frontal Lobe     PSH: Past Surgical History  Procedure Laterality Date  . Cyst on testicle Right   . Appendectomy    . Tonsillectomy    . Knee surgery Bilateral     arthroscopic surg /  bil knees  . Fatty tumor Right     removal forearm  . Colonoscopy w/ polypectomy    . Sphincterotomy  08/28/2011    Procedure: SPHINCTEROTOMY;  Surgeon: Devin Faster. Cornett, MD;  Location: [facility];  Service: General;  Laterality: N/A;  Lateral internal sphincterotomy  . Hemorrhoid surgery  08/28/2011    Procedure: HEMORRHOIDECTOMY;  Surgeon: Devin Faster. Cornett, MD;  Location: [facility];  Service: General;  Laterality: N/A;  ,possible hemorrhoidectomy  . Examination under anesthesia  12/13/2011    Procedure: EXAM UNDER ANESTHESIA;  Surgeon: Devin Moores A. Cornett, MD;  Location: [facility];  Service: General;  Laterality: N/A;  . Anal fissure repair  2013    Gonzalez   I have reviewed the [place] and SH and  If appropriate update it with new information. No Known Allergies Scheduled Meds: . fluconazole (DIFLUCAN) IV  100 mg Intravenous Q0600  . heparin  5,000 Units Subcutaneous 3 times per day  . nystatin  5 mL Oral BID  . pantoprazole (PROTONIX) IV  40 mg Intravenous Q12H  . sodium chloride  10-40 mL Intracatheter Q12H  . sodium chloride  3 mL Intravenous Q12H  . sucralfate  1 g Oral TID WC & HS   Continuous Infusions: . dextrose 5 % and 0.9% NaCl 125 mL/hr at 08/22/13 1444   PRN Meds:.acetaminophen, acetaminophen, albuterol, guaiFENesin-dextromethorphan, morphine injection, ondansetron (ZOFRAN) IV, ondansetron, oxyCODONE, promethazine, sodium chloride    BP 149/73  Pulse 92  Temp(Src) 98.5 F (36.9 C) (Axillary)  Resp 15  Ht 5\' 4"  (1.626 m)  Wt 42.1  kg (92 lb 13 oz)  BMI 15.92 kg/m2  SpO2 100%   PPS: 50-60   Intake/Output Summary (Last 24 hours) at 08/22/13 1524 Last data filed at 08/22/13 0720  Gross per 24 hour  Intake 342.08 ml  Output    175 ml  Net 167.08 ml     Physical Exam:  General: Alert, NAD HEENT:  [place], sclera anicteric, mmm NECK: supple Chest:   CTAB, symm exp CVS: RRR Abdomen: soft, mild tenderness around PEG site. Decreased BS Ext: no c/c/e Neuro: moves all extremities Skin; Warm/Dry Lymph: no palpable cervical adenopathy. Psych: mood slightly down  Labs: CBC    Component Value Date/Time   WBC 5.1 08/22/2013 0438   WBC 7.9 08/13/2013 1050   RBC 3.35* 08/22/2013 0438   RBC 2.98* 08/13/2013 1050   HGB 9.7* 08/22/2013 0438   HGB 8.6* 08/13/2013 1050   HCT 28.9* 08/22/2013 0438   HCT 25.8* 08/13/2013 1050   PLT 337 08/22/2013 0438   PLT 284 08/13/2013 1050   MCV 86.3 08/22/2013 0438   MCV 86.5 08/13/2013 1050   MCH 28.7 08/22/2013 0438   MCH 29.0 08/13/2013 1050   MCHC 33.2 08/22/2013 0438   MCHC 33.5 08/13/2013 1050   RDW 14.6 08/22/2013 0438   RDW 13.3 08/13/2013 1050   LYMPHSABS 1.4 08/22/2013 0011   LYMPHSABS 1.0 08/13/2013 1050   MONOABS 1.2* 08/22/2013 0011   MONOABS 0.2 08/13/2013 1050   EOSABS 0.0 08/22/2013 0011   EOSABS 0.2 08/13/2013 1050   BASOSABS 0.0 08/22/2013 0011   BASOSABS 0.1 08/13/2013 1050    BMET    Component Value Date/Time   NA 143 08/22/2013 0438   NA 140 08/08/2013 1433   K 4.2 08/22/2013 0438   K 4.4 08/08/2013 1433   CL 107 08/22/2013 0438   CO2 27 08/22/2013 0438   CO2 30* 08/08/2013 1433   GLUCOSE 117* 08/22/2013 0438   GLUCOSE 122 08/08/2013 1433   BUN 10 08/22/2013 0438   BUN 19.9 08/08/2013 1433   CREATININE 0.88 08/22/2013 0438   CREATININE 0.9 08/08/2013 1433   CALCIUM 8.9 08/22/2013 0438   CALCIUM 9.7 08/08/2013 1433   GFRNONAA >90 08/22/2013 0438   GFRAA >90 08/22/2013 0438    CMP     Component Value Date/Time   NA 143 08/22/2013 0438   NA 140 08/08/2013 1433   K 4.2 08/22/2013  0438   K 4.4 08/08/2013 1433   CL 107 08/22/2013 0438   CO2 27 08/22/2013 0438   CO2 30* 08/08/2013 1433   GLUCOSE 117* 08/22/2013 0438   GLUCOSE 122 08/08/2013 1433   BUN 10 08/22/2013 0438   BUN 19.9 08/08/2013 1433   CREATININE 0.88 08/22/2013 0438   CREATININE 0.9 08/08/2013 1433   CALCIUM 8.9 08/22/2013 0438   CALCIUM 9.7 08/08/2013 1433   PROT 7.9 08/22/2013 0011   PROT 7.0 08/08/2013 1433   ALBUMIN 3.8 08/22/2013 0011   ALBUMIN 3.3* 08/08/2013 1433   AST 35 08/22/2013 0011   AST 30 08/08/2013 1433   ALT 32 08/22/2013 0011   ALT 23 08/08/2013 1433   ALKPHOS 78 08/22/2013 0011   ALKPHOS 66 08/08/2013 1433   BILITOT 0.5 08/22/2013 0011   BILITOT 0.50 08/08/2013 1433   GFRNONAA >90 08/22/2013 0438   GFRAA >90 08/22/2013 0438   06/19/13 MRI Brain IMPRESSION:  Single 3 mm enhancing lesion in the right frontal lobe, concerning  for solitary metastasis.    08/17/13 CT Abd/Pelvis IMPRESSION:  Re- demonstration of distal esophageal mass, similar to slightly  improved, corresponding  to patient's known esophageal cancer with  similar small gastrohepatic and celiac axis lymph nodes. Interval  placement of gastrostomy tube, intraluminal retaining lobe.  Colonic diverticulosis without CT findings of acute diverticulitis.  Moderate amount of retained large bowel stool.   08/21/13 CXR IMPRESSION:  No acute cardiopulmonary findings.   8/21 KUB IMPRESSION:  1. Possible constipation  2. Numerous colonic diverticula.   Doran Clay D.O. Palliative Medicine Team at [facility]  Pager: [phone] Team Phone: [phone]

## 2013-08-22 NOTE — H&P (Signed)
PATIENT DETAILS Name: Devin Gonzalez Age: 58 y.o. Sex: male Date of Birth: 1955/02/18 Admit Date: 08/21/2013 CWU:[id number] W, MD   CHIEF COMPLAINT:  Vomiting  HPI: Devin Gonzalez is a 59 y.o. male with a Past Medical History of esophageal cancer with brain metastases- currently getting chemotherapy, and status post radiation treatment to the esophageal mass and also to the brain who presents today with the above noted complaint. Per patient, on 8/16 he had persistent nausea and vomiting that resolved with supportive care. On 8/20 a.m., patient again had recurrence of persistent nausea and vomiting. He claims that he probably vomited more than 20 times on 8/20. He denies any blood in the vomitus. He was not able to keep any of his medications down as well. It is of persistent vomiting, patient was brought to the emergency room, he was found to be dehydrated, I was then asked to admit this patient for further evaluation and treatment. Patient complains of burning sensation to his chest and to his throat area, which he attributes from reflux and persistent vomiting. His last bowel movement was 2 days back- he however is passing gas. He is now being admitted for further evaluation and treatment.   ALLERGIES:  No Known Allergies  PAST MEDICAL HISTORY: Past Medical History  Diagnosis Date  . Allergy   . Hemorrhoids   . Back pain     right side  . Bunion, right foot   . Left tennis elbow   . Osteoarthritis     hands, knees, right hip  . Prostatitis   . Diverticulosis 2013  . Adenomatous colon polyp   . ED (erectile dysfunction)   . Tobacco abuse   . Reflux esophagitis   . Nonspecific elevation of levels of transaminase or lactic acid dehydrogenase (LDH)   . S/P radiation therapy 06-10-13 to 06-23-13                                6-9 to 06-23-13                                                                                    Distal Esophagus / 30 Gy in 10 fractions                   . Status post chemotherapy     Started (ECF) consisting of  Epirubicin 50 mg/m2 IV, Cisplatin 60 mg/m2 IV on day #1, Fluorouracil 200 mg/m2 per day IV continuous infusion over 24 hours daily on days 1-21 cycled every 21 days for six months on 06/27/2013  . Brain metastases 06/19/13     Right Frontal Lobe    PAST SURGICAL HISTORY: Past Surgical History  Procedure Laterality Date  . Cyst on testicle Right   . Appendectomy    . Tonsillectomy    . Knee surgery Bilateral     arthroscopic surg /  bil knees  . Fatty tumor Right     removal forearm  . Colonoscopy w/ polypectomy    . Sphincterotomy  08/28/2011    Procedure: SPHINCTEROTOMY;  Surgeon: Joyice Faster. Cornett, MD;  Location: [place];  Service: General;  Laterality: N/A;  Lateral internal sphincterotomy  . Hemorrhoid surgery  08/28/2011    Procedure: HEMORRHOIDECTOMY;  Surgeon: Joyice Faster. Cornett, MD;  Location: [place];  Service: General;  Laterality: N/A;  ,possible hemorrhoidectomy  . Examination under anesthesia  12/13/2011    Procedure: EXAM UNDER ANESTHESIA;  Surgeon: Marcello Moores A. Cornett, MD;  Location: [place];  Service: General;  Laterality: N/A;  . Anal fissure repair  2013    Cornett    MEDICATIONS AT HOME: Prior to Admission medications   Medication Sig Start Date End Date Taking? Authorizing Provider  aspirin-sod bicarb-citric acid (ALKA-SELTZER) 325 MG TBEF tablet Take 325 mg by mouth every 6 (six) hours as needed (acid reflux).   Yes Historical Provider, MD  baclofen (LIORESAL) 10 MG tablet Take 1 tablet (10 mg total) by mouth 3 (three) times daily. 08/13/13  Yes Concha Norway, MD  capecitabine (XELODA) 500 MG tablet Take 1 tablet (500 mg total) by mouth 2 (two) times daily after a meal. 08/06/13 08/27/13 Yes Concha Norway, MD  DiphenhydrAMINE HCl (BENADRYL ALLERGY PO) Take 25 mg by mouth at bedtime as needed (sleep/itching).    Yes Historical Provider, MD  dronabinol (MARINOL) 2.5 MG capsule Take 1 capsule (2.5 mg total) by  mouth 2 (two) times daily before a meal. 07/30/13  Yes Concha Norway, MD  feeding supplement (BOOST HIGH PROTEIN) LIQD Take 1 Container by mouth daily.    Yes Historical Provider, MD  fluconazole (DIFLUCAN) 10 MG/ML suspension Take 100 mg by mouth daily.   Yes Historical Provider, MD  lidocaine (XYLOCAINE) 2 % solution Use as directed 20 mLs in the mouth or throat 3 (three) times daily as needed for mouth pain.   Yes Historical Provider, MD  lidocaine-prilocaine (EMLA) cream Apply 1 application topically as needed (for port access).   Yes Historical Provider, MD  LORazepam (ATIVAN) 0.5 MG tablet Take 1 tablet (0.5 mg total) by mouth every 6 (six) hours as needed (Nausea or vomiting). 08/13/13  Yes Concha Norway, MD  montelukast (SINGULAIR) 10 MG tablet Take 10 mg by mouth daily.  06/05/13  Yes Historical Provider, MD  nicotine (NICODERM CQ - DOSED IN MG/24 HOURS) 14 mg/24hr patch Place 14 mg onto the skin daily.   Yes Historical Provider, MD  Nutritional Supplements (FEEDING SUPPLEMENT, OSMOLITE 1.5 CAL,) LIQD Begin Osmolite 1.5 at 40 ml/hr from 6 pm to 6 am with 60 cc free water before and after continuous feeding. Increase 10 ml/hr daily to goal of 90 ml/hr for 12 hours.  Drink or flush tube with additional 240 ml free water TID. SEND feeding pump but DO NOT SEND formula.  Patient has formula. 07/31/13  Yes Concha Norway, MD  nystatin (MYCOSTATIN) 100000 UNIT/ML suspension Take 5 mLs by mouth 2 (two) times daily.    Yes Historical Provider, MD  ondansetron (ZOFRAN ODT) 8 MG disintegrating tablet Take 1 tablet (8 mg total) by mouth every 8 (eight) hours as needed for nausea or vomiting. 08/17/13  Yes Shaune Pollack, MD  Oxycodone HCl 10 MG TABS Take 10 mg by mouth every 6 (six) hours as needed (pain).   Yes Historical Provider, MD  pantoprazole (PROTONIX) 40 MG tablet Take 40 mg by mouth daily.   Yes Historical Provider, MD  prochlorperazine (COMPAZINE) 10 MG tablet Take 10 mg by mouth every 6 (six) hours as  needed for nausea.  07/18/13  Yes Historical Provider, MD  sodium phosphate (FLEET) 7-19 GM/118ML ENEM Place 133 mLs (1 enema total) rectally once. Please administer enema rectally x 1 08/04/13  Yes Eppie Gibson, MD  sucralfate (CARAFATE) 1 GM/10ML suspension Take 1 g by mouth 4 (four) times daily -  with meals and at bedtime.   Yes Historical Provider, MD  Tetrahydrozoline HCl (VISINE OP) Place 2-4 drops into both eyes 2 (two) times daily as needed. For dry eyes   Yes Historical Provider, MD    FAMILY HISTORY: Family History  Problem Relation Age of Onset  . Lung cancer Mother   . Cancer Father   . Lung cancer Brother   . Stroke Brother     SOCIAL HISTORY:  reports that he quit smoking about 8 weeks ago. His smoking use included Cigarettes. He has a 10 pack-year smoking history. He has never used smokeless tobacco. He reports that he does not drink alcohol or use illicit drugs.  REVIEW OF SYSTEMS:  Constitutional:   No  weight loss, night sweats,  Fevers, chills, fatigue.  HEENT:    No headaches, Difficulty swallowing,Tooth/dental problems,Sore throat,   Cardio-vascular: No chest pain,  Orthopnea, PND, swelling in lower extremities, anasarca, dizziness, palpitations  GI:  No diarrhea, change in  bowel habits.  Resp: No shortness of breath with exertion or at rest.  No excess mucus, no productive cough, No non-productive cough,  No coughing up of blood.No change in color of mucus.No wheezing.No chest wall deformity  Skin:  no rash or lesions.  GU:  no dysuria, change in color of urine, no urgency or frequency.  No flank pain.  Musculoskeletal: No joint pain or swelling.  No decreased range of motion.  No back pain.  Psych: No change in mood or affect. No depression or anxiety.  No memory loss.   PHYSICAL EXAM: Blood pressure 118/63, pulse 81, temperature 98.1 F (36.7 C), temperature source Oral, resp. rate 12, SpO2 98.00%.  General appearance :Awake, alert, not in any  distress. Speech Clear. Not toxic Looking HEENT: Atraumatic and Normocephalic, pupils equally reactive to light and accomodation Neck: supple, no JVD. No cervical lymphadenopathy.  Chest:Good air entry bilaterally, no added sounds  CVS: S1 S2 regular, no murmurs.  Abdomen: Bowel sounds present, Non tender and not distended with no gaurding, rigidity or rebound. PEG tube in place, area without discharge and is mostly dry. Extremities: B/L Lower Ext shows no edema, both legs are warm to touch Neurology: Awake alert, and oriented X 3, CN II-XII intact, Non focal Skin:No Rash Wounds:N/A  LABS ON ADMISSION:   Recent Labs  08/22/13 0011  NA 141  K 3.6*  CL 99  CO2 25  GLUCOSE 125*  BUN 11  CREATININE 1.03  CALCIUM 10.3    Recent Labs  08/22/13 0011  AST 35  ALT 32  ALKPHOS 78  BILITOT 0.5  PROT 7.9  ALBUMIN 3.8    Recent Labs  08/22/13 0011  LIPASE 14    Recent Labs  08/22/13 0011  WBC 6.7  NEUTROABS 4.1  HGB 11.9*  HCT 35.1*  MCV 84.6  PLT 409*    Recent Labs  08/22/13 0011  TROPONINI <0.30   No results found for this basename: DDIMER,  in the last 72 hours No components found with this basename: POCBNP,    RADIOLOGIC STUDIES ON ADMISSION: Dg Chest Portable 1 View  08/22/2013   CLINICAL DATA:  Chest pain and shortness of Breath.  EXAM: PORTABLE CHEST - 1 VIEW  COMPARISON:  08/17/2013.  FINDINGS: The power port is stable. The cardiac silhouette, mediastinal and hilar contours are normal and unchanged. The lungs are clear. No pleural effusion.  IMPRESSION: No acute cardiopulmonary findings.   Electronically Signed   By: Kalman Jewels M.D.   On: 08/22/2013 00:55     EKG: Independently reviewed. Sinus Tachycardia  ASSESSMENT AND PLAN: Present on Admission:  . Intractable nausea and vomiting - Suspect related to chemotherapy, underlying malignancy. Will check a abdominal x-ray to make sure there is no acute pathology obstruction. Doubt any cardiac  etiology at this point. Recent CT scan of the abdomen done on 8/16 negative for any acute abnormalities.  - For now, keep n.p.o., scheduled Zofran, PPI, IV fluids and follow clinical course. Would need to notify patient's primary oncologist of patient's admission tomorrow morning he did   . Protein-calorie malnutrition, severe - Resume supplements once oral intake/PEG feeds resumed.   . Oral pharyngeal candidiasis - Currently on fluconazole as outpatient-would change to IV but continue.   Marland Kitchen GERD (gastroesophageal reflux disease) - Placed on IV PPI   . Malignant neoplasm of lower third of esophagus - Currently getting chemotherapy, and radiation treatments. Will need to notify oncology in a.m.  Further plan will depend as patient's clinical course evolves and further radiologic and laboratory data become available. Patient will be monitored closely.   Above noted plan was discussed with patient/fiance, they were in agreement.   DVT Prophylaxis: Prophylactic Heparin  Code Status: Full Code  Total time spent for admission equals 45 minutes.  [facility] Pager [phone]  If 7PM-7AM, please contact night-coverage [email or web address] Password [email or web address] 08/22/2013, 2:59 AM  **Disclaimer: This note may have been dictated with voice recognition software. Similar sounding words can inadvertently be transcribed and this note may contain transcription errors which may not have been corrected upon publication of note.**

## 2013-08-22 NOTE — Progress Notes (Signed)
Devin Gonzalez   DOB:01-10-1955   MW#:[id number]   UVO#:[id number]  Subjective: Chart reviewed.  Patient still reports nausea and burning chest pain with reflux.  His last bowel movement was 2 days ago.   Objective:  Filed Vitals:   08/22/13 0654  BP: 155/82  Pulse:   Temp:   Resp:     Body mass index is 15.92 kg/(m^2).  Intake/Output Summary (Last 24 hours) at 08/22/13 1159 Last data filed at 08/22/13 0720  Gross per 24 hour  Intake 342.08 ml  Output    175 ml  Net 167.08 ml    Chronically ill appearing; AAOx3. Alopecia.   Sclerae unicteric  Lungs clear -- no rales or rhonchi in anterior fields  Heart regular rate and rhythm  Abdomen + BS, +PEG, no distention  MSK no peripheral edema  Neuro nonfocal  R Port in place Labs:  Lab Results  Component Value Date   WBC 5.1 08/22/2013   HGB 9.7* 08/22/2013   HCT 28.9* 08/22/2013   MCV 86.3 08/22/2013   PLT 337 08/22/2013   NEUTROABS 4.1 08/22/2013   Basic Metabolic Panel:  Recent Labs Lab 08/17/13 2104 08/22/13 0011 08/22/13 0438  NA 138 141 143  K 4.0 3.6* 4.2  CL 98 99 107  CO2 26 25 27   GLUCOSE 128* 125* 117*  BUN 10 11 10   CREATININE 0.65 1.03 0.88  CALCIUM 9.9 10.3 8.9   GFR Estimated Creatinine Clearance: 55.8 ml/min (by C-G formula based on Cr of 0.88). Liver Function Tests:  Recent Labs Lab 08/17/13 2104 08/22/13 0011  AST 44* 35  ALT 37 32  ALKPHOS 85 78  BILITOT 0.4 0.5  PROT 7.3 7.9  ALBUMIN 3.3* 3.8    Recent Labs Lab 08/17/13 2104 08/22/13 0011  LIPASE 14 14   CBC:  Recent Labs Lab 08/17/13 2104 08/22/13 0011 08/22/13 0438  WBC 5.9 6.7 5.1  NEUTROABS 4.2 4.1  --   HGB 11.1* 11.9* 9.7*  HCT 31.7* 35.1* 28.9*  MCV 82.6 84.6 86.3  PLT 256 409* 337   Cardiac Enzymes:  Recent Labs Lab 08/22/13 0011 08/22/13 0438 08/22/13 1000  TROPONINI <0.30 <0.30 <0.30   Studies:  Dg Abd 1 View  08/22/2013   CLINICAL DATA:  Chest pain and abdominal pain.  EXAM: ABDOMEN - 1 VIEW   COMPARISON:  Abdominal CT 08/17/2013  FINDINGS: A pigtail catheter retention loop overlaps the stomach. T tacks likely present at this location.  Prominent volume of stool in the proximal colon. Numerous distal colonic diverticula with retained barium. No concerning intra-abdominal mass effect or calcification. There is extensive aortic atherosclerotic disease. Lung bases are clear.  IMPRESSION: 1. Possible constipation 2. Numerous colonic diverticula.   Electronically Signed   By: Tiburcio Pea M.D.   On: 08/22/2013 03:30   Dg Chest Portable 1 View  08/22/2013   CLINICAL DATA:  Chest pain and shortness of Breath.  EXAM: PORTABLE CHEST - 1 VIEW  COMPARISON:  08/17/2013.  FINDINGS: The power port is stable. The cardiac silhouette, mediastinal and hilar contours are normal and unchanged. The lungs are clear. No pleural effusion.  IMPRESSION: No acute cardiopulmonary findings.   Electronically Signed   By: Loralie Champagne M.D.   On: 08/22/2013 00:55    Assessment/Plan: 58 y.o.  1. Refractory nausea/vomiting secondary to to chemotherapy.   --Please hold chemotherapy secondary to n/v.  Continue supportive care measures with intravenous hydration and anti-emetics. On zofran 4 mg every 6 hours prn and  promethazine 25 mg q 6 hours.   Might consider adding low dose dexamethasone.  We will consult palliative care for recommendations.   2. Burning, GI discomfort secondary to malignancy plus/minus chemotherapy.  --Continue protonix 40 mg q 12 hours.   --Continue sucralfate 1 gram tid.   3. Metastatic esophageal adenocarcinoma (GE junction), Stage IV  -- He was started on  EOX (epirubicin, oxaliplatin and capecitabine on 08/06/2013 s/p PEG tube placed on 07/24/2013. It is based clinical trials demonstrating that outcomes were comparable when capecitabine was substituted for infusional 5-FU in the ECF regimen, a finding that was reinforced in a subsequent meta-analysis of this and one other trial. It consists  of the following:     --Epirubicin 50 mg per meter squared IV on day one         --Oxaliplatin 130 mg per meter squared IV on day one    --Capecitabine 625 mg per meter squared oral on days 1 through 21 every 21 days.   --We will plan on 6-7 cycles of treatment or unacceptable toxicity. References are as follows: Kathreen Cosier, et. Al, NEJM 2956;213:08 and Sumpter K, et al. Br J Cancer 2005; 65:7846. We will hold Capecitabine in the setting of #1 and #2. Given his dysphagia and weight lost, he saw Dr. Basilio Cairo in consideration of focal palliative XRT to his esophogeal mass. He was started on 06/08 and finished on 06/22.  He has evidence of bone metastases, and bisphosphonate therapy will also be considered. He had an MRI of brain with a solitary brain metastasis.   --At his request, we will facilitate a second-opinion evaluation at [facility] [place], [place]).    2. Brain metastases, Visual Changes, resolved.  --He had an MRI of brain as noted above with one isolated brain metastases. Single isolated brain met (3 mm in size) is unlikely to cause his visual changes.  He had truebeam stereotactic radiation treatment per Dr. Basilio Cairo on 08/11/2013.   3. 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  --He was evaluated by audiology with significant hearing lost. Recommended to stop cisplatin. He is being evaluated for hearing aide placement.   4. Pain in back and chest likely secondary to malignancy and above #1, #2.  --Continue dilaudid and morphine prn.   5. S/p PEG (07/24/2013) secondary to anorexia and odynophagia secondary to #1.  --He was referred to nutrition to further optimize his nutrition given his rapid weight lost. He will continue anti nausea medications. His albumin has improved.   6. Disposition. Full code.     Zykee Avakian, MD 08/22/2013  11:59 AM

## 2013-08-22 NOTE — Progress Notes (Signed)
INITIAL NUTRITION ASSESSMENT  DOCUMENTATION CODES Per approved criteria  -Severe malnutrition in the context of chronic illness -Underweight  Pt meets criteria for severe MALNUTRITION in the context of chronic illness as evidenced by severe muscle wasting and subcutaneous fat loss, 23% body weight loss in 3 months.   INTERVENTION: -Diet advancement/TF initiation per MD -As warranted: initiate nocturnal feeds of Osmolite 1.5 @ 20 ml/hr via PEG and increase by 10 ml every 24 hours to goal rate of 75 ml/hr for 14 hours. Initiate at 6pm - 8am -Tube feeding regimen provides 1575 kcal (100% of needs), 66 grams of protein, and 800 ml of H2O.  -Will continue to monitor   NUTRITION DIAGNOSIS: Inadequate oral intake related to nausea/vomiting as evidenced by PO intake <50%, ongoing weight loss.   Goal: TF + PO intake to meet >/= 90% of their estimated nutrition needs    Monitor:  Pt to meet >/= 90% of their estimated nutrition needs    Reason for Assessment: MST  58 y.o. male  Admitting Dx: Intractable nausea and vomiting  ASSESSMENT: Devin Gonzalez is a 58 y.o. male with a Past Medical History of esophageal cancer with brain metastases- currently getting chemotherapy, and status post radiation treatment to the esophageal mass and also to the brain who presents today with the above noted complaint. Per patient, on 8/16 he had persistent nausea and vomiting that resolved with supportive care. On 8/20 a.m., patient again had recurrence of persistent nausea and vomiting. He claims that he probably vomited more than 20 times on 8/20.  -Pt s/p PEG placement on 07/24/2013 -Pt being followed by outpatient oncology RD. Last seen on 08/13/2013. Pt was tolerating Osmolite 1.5 at 75 ml/hr for 14 hours. This regimen provides 1575 kcal, 66 gram protein, and 800 ml free water flushes. Consumed minimal PO intake.  -Weighed 100 lbs, indicating a possible (also likely r/t to dehydration) 8 lb weight loss  in < one month (8% body weight, severe for time frame). Pt endoresed an overall unintentional wt loss of 30 lbs over past 3 months (23% body weight loss, also severe for time frame) -Continues to have minimal PO intake, pt reported consuming [employer] occasionally, and will try to consume soups; however it takes him several hours to finish one bowl d/t feelings of early satiety. Pt also with oral candidiasis that inhibits PO intake -Pt reported to have been tolerating TF regimen two nights ago w/out nausea/vomiting -Currently NPO d/t frequent nausea/vomiting episodes, receiving Zofran/PPI Nutrition Focused Physical Exam:  Subcutaneous Fat:  Orbital Region: severe wasting Upper Arm Region: severe wasting Thoracic and Lumbar Region: n/a  Muscle:  Temple Region: severe wasting Clavicle Bone Region: moderate wasting Clavicle and Acromion Bone Region: moderate wasting Scapular Bone Region: n/a Dorsal Hand: n/a Patellar Region: severe wasting Anterior Thigh Region: severe wasting Posterior Calf Region: severe wasting  Edema: n/a    Height: Ht Readings from Last 1 Encounters:  08/22/13 5\' 4"  (1.626 m)    Weight: Wt Readings from Last 1 Encounters:  08/22/13 92 lb 13 oz (42.1 kg)    Ideal Body Weight: 130 lbs  % Ideal Body Weight: 71%  Wt Readings from Last 10 Encounters:  08/22/13 92 lb 13 oz (42.1 kg)  08/13/13 100 lb 4 oz (45.473 kg)  08/06/13 97 lb 6.4 oz (44.18 kg)  08/04/13 98 lb 9.6 oz (44.725 kg)  07/30/13 93 lb 14.4 oz (42.593 kg)  07/24/13 105 lb (47.628 kg)  07/16/13 105 lb  12.8 oz (47.991 kg)  07/10/13 106 lb 1.6 oz (48.127 kg)  07/08/13 107 lb 14.4 oz (48.943 kg)  06/29/13 119 lb 0.8 oz (54 kg)    Usual Body Weight: 130 lbs  % Usual Body Weight: 71%  BMI:  Body mass index is 15.92 kg/(m^2). Underweight  Estimated Nutritional Needs: Kcal: 1500-1700 Protein: 65-80 gram Fluid: >/=1500 ml/daily  Skin: WDL  Diet Order:  NPO  EDUCATION NEEDS: -No education needs identified at this time   Intake/Output Summary (Last 24 hours) at 08/22/13 1112 Last data filed at 08/22/13 0720  Gross per 24 hour  Intake 342.08 ml  Output    175 ml  Net 167.08 ml    Last BM: 8/18   Labs:   Recent Labs Lab 08/17/13 2104 08/22/13 0011 08/22/13 0438  NA 138 141 143  K 4.0 3.6* 4.2  CL 98 99 107  CO2 26 25 27   BUN 10 11 10   CREATININE 0.65 1.03 0.88  CALCIUM 9.9 10.3 8.9  GLUCOSE 128* 125* 117*    CBG (last 3)  No results found for this basename: GLUCAP,  in the last 72 hours  Scheduled Meds: . fluconazole (DIFLUCAN) IV  100 mg Intravenous Q0600  . heparin  5,000 Units Subcutaneous 3 times per day  . nystatin  5 mL Oral BID  . pantoprazole (PROTONIX) IV  40 mg Intravenous Q12H  . sodium chloride  10-40 mL Intracatheter Q12H  . sodium chloride  3 mL Intravenous Q12H  . sucralfate  1 g Oral TID WC & HS    Continuous Infusions: . dextrose 5 % and 0.9% NaCl 125 mL/hr at 08/22/13 0534    Past Medical History  Diagnosis Date  . Allergy   . Hemorrhoids   . Back pain     right side  . Bunion, right foot   . Left tennis elbow   . Osteoarthritis     hands, knees, right hip  . Prostatitis   . Diverticulosis 2013  . Adenomatous colon polyp   . ED (erectile dysfunction)   . Tobacco abuse   . Reflux esophagitis   . Nonspecific elevation of levels of transaminase or lactic acid dehydrogenase (LDH)   . S/P radiation therapy 06-10-13 to 06-23-13                                6-9 to 06-23-13                                                                                    Distal Esophagus / 30 Gy in 10 fractions                  . Status post chemotherapy     Started (ECF) consisting of  Epirubicin 50 mg/m2 IV, Cisplatin 60 mg/m2 IV on day #1, Fluorouracil 200 mg/m2 per day IV continuous infusion over 24 hours daily on days 1-21 cycled every 21 days for six months on 06/27/2013  . Brain metastases 06/19/13      Right Frontal Lobe    Past Surgical History  Procedure Laterality Date  .  Cyst on testicle Right   . Appendectomy    . Tonsillectomy    . Knee surgery Bilateral     arthroscopic surg /  bil knees  . Fatty tumor Right     removal forearm  . Colonoscopy w/ polypectomy    . Sphincterotomy  08/28/2011    Procedure: SPHINCTEROTOMY;  Surgeon: Joyice Faster. Cornett, MD;  Location: [place];  Service: General;  Laterality: N/A;  Lateral internal sphincterotomy  . Hemorrhoid surgery  08/28/2011    Procedure: HEMORRHOIDECTOMY;  Surgeon: Joyice Faster. Cornett, MD;  Location: [place];  Service: General;  Laterality: N/A;  ,possible hemorrhoidectomy  . Examination under anesthesia  12/13/2011    Procedure: EXAM UNDER ANESTHESIA;  Surgeon: Marcello Moores A. Cornett, MD;  Location: [place];  Service: General;  Laterality: N/A;  . Anal fissure repair  2013    [place] LDN Clinical Dietitian [phone]

## 2013-08-22 NOTE — ED Provider Notes (Signed)
Medical screening examination/treatment/procedure(s) were conducted as a shared visit with non-physician practitioner(s) and myself.  I personally evaluated the patient during the encounter.   EKG Interpretation None     Pt with esophageal cancer on chemo with intractable n/v.  Seen earlier in week in ED, no improvement.  Plan for admission.  Kalman Drape, MD 08/22/13 [phone]

## 2013-08-23 DIAGNOSIS — D6959 Other secondary thrombocytopenia: Secondary | ICD-10-CM

## 2013-08-23 DIAGNOSIS — T451X5A Adverse effect of antineoplastic and immunosuppressive drugs, initial encounter: Secondary | ICD-10-CM

## 2013-08-23 LAB — COMPREHENSIVE METABOLIC PANEL
ALBUMIN: 2.5 g/dL — AB (ref 3.5–5.2)
ALK PHOS: 56 U/L (ref 39–117)
ALT: 19 U/L (ref 0–53)
AST: 24 U/L (ref 0–37)
Anion gap: 11 (ref 5–15)
BUN: 6 mg/dL (ref 6–23)
CO2: 23 mEq/L (ref 19–32)
Calcium: 8.5 mg/dL (ref 8.4–10.5)
Chloride: 107 mEq/L (ref 96–112)
Creatinine, Ser: 0.82 mg/dL (ref 0.50–1.35)
GFR calc non Af Amer: >90 mL/min (ref >90)
GLUCOSE: 108 mg/dL — AB (ref 70–99)
POTASSIUM: 3.5 meq/L — AB (ref 3.7–5.3)
Sodium: 141 mEq/L (ref 137–147)
TOTAL PROTEIN: 5.7 g/dL — AB (ref 6.0–8.3)
Total Bilirubin: 0.4 mg/dL (ref 0.3–1.2)

## 2013-08-23 LAB — CBC
HEMATOCRIT: 27.6 % — AB (ref 39.0–52.0)
HEMOGLOBIN: 9.2 g/dL — AB (ref 13.0–17.0)
MCH: 28.8 pg (ref 26.0–34.0)
MCHC: 33.3 g/dL (ref 30.0–36.0)
MCV: 86.5 fL (ref 78.0–100.0)
Platelets: 356 10*3/uL (ref 150–400)
RBC: 3.19 MIL/uL — ABNORMAL LOW (ref 4.22–5.81)
RDW: 14.5 % (ref 11.5–15.5)
WBC: 6.2 10*3/uL (ref 4.0–10.5)

## 2013-08-23 LAB — URINE CULTURE
Colony Count: NO GROWTH
Culture: NO GROWTH

## 2013-08-23 MED ORDER — POTASSIUM CHLORIDE 2 MEQ/ML IV SOLN
INTRAVENOUS | Status: DC
  Administered 2013-08-23 – 2013-08-26 (×4): via INTRAVENOUS
  Filled 2013-08-23 (×10): qty 1000

## 2013-08-23 MED ORDER — OXYCODONE HCL 5 MG/5ML PO SOLN
5.0000 mg | ORAL | Status: DC | PRN
  Administered 2013-08-23 – 2013-08-24 (×2): 10 mg
  Administered 2013-08-24: 5 mg
  Administered 2013-08-24: 10 mg
  Filled 2013-08-23 (×3): qty 10
  Filled 2013-08-23: qty 5

## 2013-08-23 MED ORDER — DIPHENHYDRAMINE HCL 50 MG/ML IJ SOLN
12.5000 mg | Freq: Four times a day (QID) | INTRAMUSCULAR | Status: DC | PRN
  Administered 2013-08-23 – 2013-08-28 (×7): 12.5 mg via INTRAVENOUS
  Filled 2013-08-23 (×7): qty 1

## 2013-08-23 NOTE — Progress Notes (Signed)
Patient DD:UKGURKY T Hattabaugh      DOB: 1955/05/28      HCW:[id number]   Palliative Medicine Team at [facility] Progress Note    Subjective: Feeling a little better today. Had good BM this afternoon. No episodes of vomiting since I saw him yesterday but still feeling nauseated. Burning reflux a little better.  Continues to have abdominal pain on right side. Morphine not very helpful today.     Filed Vitals:   08/23/13 1504  BP: 158/72  Pulse: 53  Temp: 98.6 F (37 C)  Resp: 20   Physical exam: General: Alert, NAD  HEENT: [place], sclera anicteric, mmm  Chest: CTAB, symm exp  CVS: RRR  Abdomen: soft, mild tenderness around PEG site.  Ext: no c/c/e  Skin; Warm/Dry    CBC    Component Value Date/Time   WBC 6.2 08/23/2013 0405   WBC 7.9 08/13/2013 1050   RBC 3.19* 08/23/2013 0405   RBC 2.98* 08/13/2013 1050   HGB 9.2* 08/23/2013 0405   HGB 8.6* 08/13/2013 1050   HCT 27.6* 08/23/2013 0405   HCT 25.8* 08/13/2013 1050   PLT 356 08/23/2013 0405   PLT 284 08/13/2013 1050   MCV 86.5 08/23/2013 0405   MCV 86.5 08/13/2013 1050   MCH 28.8 08/23/2013 0405   MCH 29.0 08/13/2013 1050   MCHC 33.3 08/23/2013 0405   MCHC 33.5 08/13/2013 1050   RDW 14.5 08/23/2013 0405   RDW 13.3 08/13/2013 1050   LYMPHSABS 1.4 08/22/2013 0011   LYMPHSABS 1.0 08/13/2013 1050   MONOABS 1.2* 08/22/2013 0011   MONOABS 0.2 08/13/2013 1050   EOSABS 0.0 08/22/2013 0011   EOSABS 0.2 08/13/2013 1050   BASOSABS 0.0 08/22/2013 0011   BASOSABS 0.1 08/13/2013 1050    CMP     Component Value Date/Time   NA 141 08/23/2013 0405   NA 140 08/08/2013 1433   K 3.5* 08/23/2013 0405   K 4.4 08/08/2013 1433   CL 107 08/23/2013 0405   CO2 23 08/23/2013 0405   CO2 30* 08/08/2013 1433   GLUCOSE 108* 08/23/2013 0405   GLUCOSE 122 08/08/2013 1433   BUN 6 08/23/2013 0405   BUN 19.9 08/08/2013 1433   CREATININE 0.82 08/23/2013 0405   CREATININE 0.9 08/08/2013 1433   CALCIUM 8.5 08/23/2013 0405   CALCIUM 9.7 08/08/2013 1433   PROT 5.7* 08/23/2013 0405   PROT 7.0 08/08/2013 1433   ALBUMIN 2.5* 08/23/2013 0405   ALBUMIN 3.3* 08/08/2013 1433   AST 24 08/23/2013 0405   AST 30 08/08/2013 1433   ALT 19 08/23/2013 0405   ALT 23 08/08/2013 1433   ALKPHOS 56 08/23/2013 0405   ALKPHOS 66 08/08/2013 1433   BILITOT 0.4 08/23/2013 0405   BILITOT 0.50 08/08/2013 1433   GFRNONAA >90 08/23/2013 0405   GFRAA >90 08/23/2013 0405     Assessment and plan: 58 yo male with PMHx of 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 Presented with uncontrolled N/V.    1. Code Status: Full  2. Goals of Care:  See initial consult. Wants to explore 2nd opinion at [facility].  Will try to address advance care planning as he is feeling better and as he allows. Hopefully can have good conversation prior to d/c.   3. Symptom Management:  1. Nausea/Vomiting- a bit better today. Continue reglan, PRN zofran/phenergan 2. Dysphagia/Reflux- s/p PEG. Agree with protonix BID and carafate 3. Constipation- Had BM today. Continue bowel regimen.   4.  Abd Pain- Morphine not helping as much. Will d/c.  Allow him to use oxycodone. His home dose of oxycodone is 3-4x as strong as current IV morphine dose.  5. Adjustment D/O vs Depression- Will see how he is feeling with symptomatic improvement. May benefit from SSRI. He wishes to hold off for now.                     6.  Hiccups- he does not remember taking baclofen at home. Can be considered if hiccups get worse.   4. Psychosocial/Spiritual: Lives at home alone. Several brothers/sisters in [place] whom he is close with. Also has niece who helps him. No kids and not married. Feels like he is in good place spiritually.   Doran Clay D.O. Palliative Medicine Team at [facility]  Pager: [phone] Team Phone: [phone]

## 2013-08-23 NOTE — Progress Notes (Signed)
[name] PROGRESS NOTE   Devin Gonzalez ZLD:[id number] DOB: [birth year] DOA: 08/21/2013 PCP: [name], MD  HPI/Subjective: Feels better than yesterday, still nauseated, but no vomiting.  Assessment/Plan: Principal Problem:   Intractable nausea and vomiting Active Problems:   Malignant neoplasm of lower third of esophagus   Protein-calorie malnutrition, severe   Oral pharyngeal candidiasis   GERD (gastroesophageal reflux disease)    . Intractable nausea and vomiting  -Suspect related to chemotherapy, underlying malignancy.  -Abdominal x-ray showed no evidence of acute events, might be constipation. -For now, keep n.p.o., scheduled Zofran, PPI, IV fluids and follow clinical course.  -Her primary oncologist Dr. Juliann Mule notified.  . Protein-calorie malnutrition, severe  -Resume supplements once oral intake/PEG feeds resumed.   . Oral pharyngeal candidiasis  -Currently on fluconazole as outpatient-would change to IV but continue.   Marland Kitchen GERD (gastroesophageal reflux disease)  -Placed on IV PPI   . Malignant neoplasm of lower third of esophagus  -Currently getting chemotherapy, and radiation treatments.   Code Status: Full code Family Communication: Plan discussed with the patient. Disposition Plan: Remains inpatient   Consultants:  Oncology.  Palliative medicine team  Procedures:  None  Antibiotics:  None   Objective: Filed Vitals:   08/23/13 0555  BP: 151/75  Pulse: 62  Temp: 99.9 F (37.7 C)  Resp: 16    Intake/Output Summary (Last 24 hours) at 08/23/13 1206 Last data filed at 08/23/13 9030  Gross per 24 hour  Intake 2912.5 ml  Output   1370 ml  Net 1542.5 ml   Filed Weights   08/22/13 0344  Weight: 42.1 kg (92 lb 13 oz)    Exam: General: Alert and awake, oriented x3, not in any acute distress. HEENT: anicteric sclera, pupils reactive to light and accommodation, EOMI CVS: S1-S2 clear, no murmur rubs or gallops Chest:  clear to auscultation bilaterally, no wheezing, rales or rhonchi Abdomen: soft nontender, nondistended, normal bowel sounds, no organomegaly Extremities: no cyanosis, clubbing or edema noted bilaterally Neuro: Cranial nerves II-XII intact, no focal neurological deficits  Data Reviewed: Basic Metabolic Panel:  Recent Labs Lab 08/17/13 2104 08/22/13 0011 08/22/13 0438 08/23/13 0405  NA 138 141 143 141  K 4.0 3.6* 4.2 3.5*  CL 98 99 107 107  CO2 26 25 27 23   GLUCOSE 128* 125* 117* 108*  BUN 10 11 10 6   CREATININE 0.65 1.03 0.88 0.82  CALCIUM 9.9 10.3 8.9 8.5   Liver Function Tests:  Recent Labs Lab 08/17/13 2104 08/22/13 0011 08/23/13 0405  AST 44* 35 24  ALT 37 32 19  ALKPHOS 85 78 56  BILITOT 0.4 0.5 0.4  PROT 7.3 7.9 5.7*  ALBUMIN 3.3* 3.8 2.5*    Recent Labs Lab 08/17/13 2104 08/22/13 0011  LIPASE 14 14   No results found for this basename: AMMONIA,  in the last 168 hours CBC:  Recent Labs Lab 08/17/13 2104 08/22/13 0011 08/22/13 0438 08/23/13 0405  WBC 5.9 6.7 5.1 6.2  NEUTROABS 4.2 4.1  --   --   HGB 11.1* 11.9* 9.7* 9.2*  HCT 31.7* 35.1* 28.9* 27.6*  MCV 82.6 84.6 86.3 86.5  PLT 256 409* 337 356   Cardiac Enzymes:  Recent Labs Lab 08/22/13 0011 08/22/13 0438 08/22/13 1000 08/22/13 1530  TROPONINI <0.30 <0.30 <0.30 <0.30   BNP (last 3 results) No results found for this basename: PROBNP,  in the last 8760 hours CBG: No results found for this basename: GLUCAP,  in the last 168 hours  Micro Recent Results (from the past 240 hour(s))  URINE CULTURE     Status: None   Collection Time    08/21/13 11:55 PM      Result Value Ref Range Status   Specimen Description URINE, RANDOM   Final   Special Requests NONE   Final   Culture  Setup Time     Final   Value: 08/22/2013 11:06     Performed at [place]     Final   Value: NO GROWTH     Performed at [facility]   Culture     Final   Value: NO GROWTH      Performed at [facility]   Report Status 08/23/2013 FINAL   Final     Studies: Dg Abd 1 View  08/22/2013   CLINICAL DATA:  Chest pain and abdominal pain.  EXAM: ABDOMEN - 1 VIEW  COMPARISON:  Abdominal CT 08/17/2013  FINDINGS: A pigtail catheter retention loop overlaps the stomach. T tacks likely present at this location.  Prominent volume of stool in the proximal colon. Numerous distal colonic diverticula with retained barium. No concerning intra-abdominal mass effect or calcification. There is extensive aortic atherosclerotic disease. Lung bases are clear.  IMPRESSION: 1. Possible constipation 2. Numerous colonic diverticula.   Electronically Signed   By: Jorje Guild M.D.   On: 08/22/2013 03:30   Dg Chest Portable 1 View  08/22/2013   CLINICAL DATA:  Chest pain and shortness of Breath.  EXAM: PORTABLE CHEST - 1 VIEW  COMPARISON:  08/17/2013.  FINDINGS: The power port is stable. The cardiac silhouette, mediastinal and hilar contours are normal and unchanged. The lungs are clear. No pleural effusion.  IMPRESSION: No acute cardiopulmonary findings.   Electronically Signed   By: Kalman Jewels M.D.   On: 08/22/2013 00:55    Scheduled Meds: . fluconazole (DIFLUCAN) IV  100 mg Intravenous Q0600  . heparin  5,000 Units Subcutaneous 3 times per day  . metoCLOPramide  10 mg Per Tube TID AC  . nystatin  5 mL Oral BID  . pantoprazole (PROTONIX) IV  40 mg Intravenous Q12H  . polyethylene glycol  17 g Per Tube Daily  . sennosides  10 mL Per Tube BID  . sodium chloride  10-40 mL Intracatheter Q12H  . sodium chloride  3 mL Intravenous Q12H  . sucralfate  1 g Oral TID WC & HS   Continuous Infusions: . dextrose 5 % and 0.9% NaCl 125 mL/hr at 08/23/13 0537       Time spent: 35 minutes    [facility] A  [name] Pager [phone] If 7PM-7AM, please contact night-coverage at [email or web address], password [facility] 08/23/2013, 12:06 PM  LOS: 2 days

## 2013-08-24 DIAGNOSIS — R42 Dizziness and giddiness: Secondary | ICD-10-CM

## 2013-08-24 DIAGNOSIS — L299 Pruritus, unspecified: Secondary | ICD-10-CM

## 2013-08-24 DIAGNOSIS — R066 Hiccough: Secondary | ICD-10-CM

## 2013-08-24 MED ORDER — HYDROMORPHONE HCL 1 MG/ML PO LIQD
2.0000 mg | ORAL | Status: DC | PRN
  Administered 2013-08-24: 4 mg
  Administered 2013-08-24 – 2013-08-25 (×3): 2 mg
  Administered 2013-08-25: 4 mg
  Administered 2013-08-25 – 2013-08-26 (×3): 2 mg
  Administered 2013-08-26: 4 mg
  Administered 2013-08-27: 2 mg
  Administered 2013-08-27 – 2013-08-28 (×4): 4 mg
  Filled 2013-08-24: qty 4
  Filled 2013-08-24 (×2): qty 2
  Filled 2013-08-24 (×2): qty 4
  Filled 2013-08-24: qty 2
  Filled 2013-08-24: qty 4
  Filled 2013-08-24 (×2): qty 2
  Filled 2013-08-24 (×4): qty 4
  Filled 2013-08-24: qty 2

## 2013-08-24 MED ORDER — ONDANSETRON HCL 4 MG/2ML IJ SOLN
4.0000 mg | Freq: Three times a day (TID) | INTRAMUSCULAR | Status: DC
  Administered 2013-08-24 – 2013-08-26 (×6): 4 mg via INTRAVENOUS
  Filled 2013-08-24 (×6): qty 2

## 2013-08-24 MED ORDER — BOOST PLUS PO LIQD
237.0000 mL | Freq: Three times a day (TID) | ORAL | Status: DC
  Filled 2013-08-24 (×2): qty 237

## 2013-08-24 MED ORDER — BOOST PLUS PO LIQD
237.0000 mL | Freq: Three times a day (TID) | ORAL | Status: DC
  Administered 2013-08-24 – 2013-08-28 (×9): 237 mL
  Filled 2013-08-24 (×13): qty 237

## 2013-08-24 MED ORDER — ALUM & MAG HYDROXIDE-SIMETH 200-200-20 MG/5ML PO SUSP
15.0000 mL | ORAL | Status: DC | PRN
  Administered 2013-08-26: 15 mL via ORAL
  Filled 2013-08-24: qty 30

## 2013-08-24 MED ORDER — MIRTAZAPINE 15 MG PO TBDP
15.0000 mg | ORAL_TABLET | Freq: Every day | ORAL | Status: DC
Start: 2013-08-24 — End: 2013-08-28
  Administered 2013-08-24 – 2013-08-27 (×4): 15 mg via ORAL
  Filled 2013-08-24 (×5): qty 1

## 2013-08-24 NOTE — Progress Notes (Signed)
[name] PROGRESS NOTE   TORRIS HOUSE TMH:[id number] DOB: May 04, 1955 DOA: 08/21/2013 PCP: [name], MD  HPI/Subjective: No vomiting since admission he still has nausea. I will start boost plus through the G-tube and monitor for nausea. I appreciate Dr. Angelia Mould help  Assessment/Plan: Principal Problem:   Intractable nausea and vomiting Active Problems:   Malignant neoplasm of lower third of esophagus   Protein-calorie malnutrition, severe   Oral pharyngeal candidiasis   GERD (gastroesophageal reflux disease)    . Intractable nausea and vomiting  -Suspect related to chemotherapy and underlying malignancy.  -Abdominal x-ray showed no evidence of acute events, might be constipation. -For now, keep n.p.o., scheduled Zofran, PPI, IV fluids and follow clinical course.  -Start tube feeding, monitor for nausea/vomiting.  . Protein-calorie malnutrition, severe  -Resume supplements once oral intake/PEG feeds resumed.   . Oral pharyngeal candidiasis  -Currently on fluconazole as outpatient-would change to IV but continue.   Marland Kitchen GERD (gastroesophageal reflux disease)  -Placed on IV PPI   . Malignant neoplasm of lower third of esophagus  -Currently getting chemotherapy, and radiation treatments.   Code Status: Full code Family Communication: Plan discussed with the patient. Disposition Plan: Remains inpatient   Consultants:  Oncology.  Palliative medicine team  Procedures:  None  Antibiotics:  None   Objective: Filed Vitals:   08/24/13 0521  BP: 151/78  Pulse: 75  Temp: 98.2 F (36.8 C)  Resp: 16    Intake/Output Summary (Last 24 hours) at 08/24/13 1057 Last data filed at 08/24/13 0758  Gross per 24 hour  Intake   2700 ml  Output   2750 ml  Net    -50 ml   Filed Weights   08/22/13 0344  Weight: 42.1 kg (92 lb 13 oz)    Exam: General: Alert and awake, oriented x3, not in any acute distress. HEENT: anicteric sclera, pupils  reactive to light and accommodation, EOMI CVS: S1-S2 clear, no murmur rubs or gallops Chest: clear to auscultation bilaterally, no wheezing, rales or rhonchi Abdomen: soft nontender, nondistended, normal bowel sounds, no organomegaly Extremities: no cyanosis, clubbing or edema noted bilaterally Neuro: Cranial nerves II-XII intact, no focal neurological deficits  Data Reviewed: Basic Metabolic Panel:  Recent Labs Lab 08/17/13 2104 08/22/13 0011 08/22/13 0438 08/23/13 0405  NA 138 141 143 141  K 4.0 3.6* 4.2 3.5*  CL 98 99 107 107  CO2 26 25 27 23   GLUCOSE 128* 125* 117* 108*  BUN 10 11 10 6   CREATININE 0.65 1.03 0.88 0.82  CALCIUM 9.9 10.3 8.9 8.5   Liver Function Tests:  Recent Labs Lab 08/17/13 2104 08/22/13 0011 08/23/13 0405  AST 44* 35 24  ALT 37 32 19  ALKPHOS 85 78 56  BILITOT 0.4 0.5 0.4  PROT 7.3 7.9 5.7*  ALBUMIN 3.3* 3.8 2.5*    Recent Labs Lab 08/17/13 2104 08/22/13 0011  LIPASE 14 14   No results found for this basename: AMMONIA,  in the last 168 hours CBC:  Recent Labs Lab 08/17/13 2104 08/22/13 0011 08/22/13 0438 08/23/13 0405  WBC 5.9 6.7 5.1 6.2  NEUTROABS 4.2 4.1  --   --   HGB 11.1* 11.9* 9.7* 9.2*  HCT 31.7* 35.1* 28.9* 27.6*  MCV 82.6 84.6 86.3 86.5  PLT 256 409* 337 356   Cardiac Enzymes:  Recent Labs Lab 08/22/13 0011 08/22/13 0438 08/22/13 1000 08/22/13 1530  TROPONINI <0.30 <0.30 <0.30 <0.30   BNP (last 3 results) No results found for this  basename: PROBNP,  in the last 8760 hours CBG: No results found for this basename: GLUCAP,  in the last 168 hours  Micro Recent Results (from the past 240 hour(s))  URINE CULTURE     Status: None   Collection Time    08/21/13 11:55 PM      Result Value Ref Range Status   Specimen Description URINE, RANDOM   Final   Special Requests NONE   Final   Culture  Setup Time     Final   Value: 08/22/2013 11:06     Performed at [place]     Final    Value: NO GROWTH     Performed at [facility]   Culture     Final   Value: NO GROWTH     Performed at [facility]   Report Status 08/23/2013 FINAL   Final  CULTURE, BLOOD (ROUTINE X 2)     Status: None   Collection Time    08/22/13 12:04 AM      Result Value Ref Range Status   Specimen Description BLOOD RIGHT HAND   Final   Special Requests BOTTLES DRAWN AEROBIC AND ANAEROBIC [facility]   Final   Culture  Setup Time     Final   Value: 08/22/2013 05:04     Performed at [facility]   Culture     Final   Value:        BLOOD CULTURE RECEIVED NO GROWTH TO DATE CULTURE WILL BE HELD FOR 5 DAYS BEFORE ISSUING A FINAL NEGATIVE REPORT     Performed at [facility]   Report Status PENDING   Incomplete  CULTURE, BLOOD (ROUTINE X 2)     Status: None   Collection Time    08/22/13 12:11 AM      Result Value Ref Range Status   Specimen Description BLOOD LEFT FOREARM   Final   Special Requests BOTTLES DRAWN AEROBIC AND ANAEROBIC 6CC   Final   Culture  Setup Time     Final   Value: 08/22/2013 05:04     Performed at [facility]   Culture     Final   Value:        BLOOD CULTURE RECEIVED NO GROWTH TO DATE CULTURE WILL BE HELD FOR 5 DAYS BEFORE ISSUING A FINAL NEGATIVE REPORT     Performed at [facility]   Report Status PENDING   Incomplete     Studies: No results found.  Scheduled Meds: . fluconazole (DIFLUCAN) IV  100 mg Intravenous Q0600  . heparin  5,000 Units Subcutaneous 3 times per day  . metoCLOPramide  10 mg Per Tube TID AC  . nystatin  5 mL Oral BID  . pantoprazole (PROTONIX) IV  40 mg Intravenous Q12H  . polyethylene glycol  17 g Per Tube Daily  . sennosides  10 mL Per Tube BID  . sodium chloride  10-40 mL Intracatheter Q12H  . sodium chloride  3 mL Intravenous Q12H  . sucralfate  1 g Oral TID WC & HS   Continuous Infusions: . dextrose 5 % and 0.45% NaCl 1,000 mL with potassium chloride 40 mEq infusion 100 mL/hr at 08/23/13 1600  .  dextrose 5 % and 0.9% NaCl 125 mL/hr at 08/23/13 0537       Time spent: 35 minutes    [facility] A  [name] Pager [phone] If 7PM-7AM, please contact night-coverage at [email or web address], password  [email or web address] 08/24/2013, 10:57 AM  LOS: 3 days

## 2013-08-24 NOTE — Progress Notes (Signed)
Patient GL:OVFIEPP T Holz      DOB: [birth year]      IRJ:[id number]   Palliative Medicine Team at [facility] Progress Note    Subjective: Having worsening nausea today.  Unable to start diet. Not taking PRN's. Had itching with oxycodone.  Not sleeping well at night.  Arminda Resides at bedside.  Continues to have pain around PEG site.  Oxycodone did much better at controlling pain. Denies F/C, dyspnea.  Moved bowels yesterday and passing flatus today. Hiccups doing better. Sleeping better.     Filed Vitals:   08/24/13 1415  BP: 147/72  Pulse: 78  Temp: 98.4 F (36.9 C)  Resp: 18   Physical exam: GEN: alert, NAD HEENT: [place], dry mm CV: RRR LUNGS: CTAB, symm exp ABD: soft, tenderness around PEG.  No erythema around tube or significant drainage. Dressing is c/d/i EXT: no c/c/e SKIN: warm/dry  CBC    Component Value Date/Time   WBC 6.2 08/23/2013 0405   WBC 7.9 08/13/2013 1050   RBC 3.19* 08/23/2013 0405   RBC 2.98* 08/13/2013 1050   HGB 9.2* 08/23/2013 0405   HGB 8.6* 08/13/2013 1050   HCT 27.6* 08/23/2013 0405   HCT 25.8* 08/13/2013 1050   PLT 356 08/23/2013 0405   PLT 284 08/13/2013 1050   MCV 86.5 08/23/2013 0405   MCV 86.5 08/13/2013 1050   MCH 28.8 08/23/2013 0405   MCH 29.0 08/13/2013 1050   MCHC 33.3 08/23/2013 0405   MCHC 33.5 08/13/2013 1050   RDW 14.5 08/23/2013 0405   RDW 13.3 08/13/2013 1050   LYMPHSABS 1.4 08/22/2013 0011   LYMPHSABS 1.0 08/13/2013 1050   MONOABS 1.2* 08/22/2013 0011   MONOABS 0.2 08/13/2013 1050   EOSABS 0.0 08/22/2013 0011   EOSABS 0.2 08/13/2013 1050   BASOSABS 0.0 08/22/2013 0011   BASOSABS 0.1 08/13/2013 1050    CMP     Component Value Date/Time   NA 141 08/23/2013 0405   NA 140 08/08/2013 1433   K 3.5* 08/23/2013 0405   K 4.4 08/08/2013 1433   CL 107 08/23/2013 0405   CO2 23 08/23/2013 0405   CO2 30* 08/08/2013 1433   GLUCOSE 108* 08/23/2013 0405   GLUCOSE 122 08/08/2013 1433   BUN 6 08/23/2013 0405   BUN 19.9 08/08/2013 1433   CREATININE 0.82 08/23/2013  0405   CREATININE 0.9 08/08/2013 1433   CALCIUM 8.5 08/23/2013 0405   CALCIUM 9.7 08/08/2013 1433   PROT 5.7* 08/23/2013 0405   PROT 7.0 08/08/2013 1433   ALBUMIN 2.5* 08/23/2013 0405   ALBUMIN 3.3* 08/08/2013 1433   AST 24 08/23/2013 0405   AST 30 08/08/2013 1433   ALT 19 08/23/2013 0405   ALT 23 08/08/2013 1433   ALKPHOS 56 08/23/2013 0405   ALKPHOS 66 08/08/2013 1433   BILITOT 0.4 08/23/2013 0405   BILITOT 0.50 08/08/2013 1433   GFRNONAA >90 08/23/2013 0405   GFRAA >90 08/23/2013 0405      Assessment and plan: 58 yo male with PMHx of 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 Presented with uncontrolled N/V.   1. Code Status: Full   2. Goals of Care:  See initial consult. Wants to explore 2nd opinion at [facility]. Will try to address advance care planning as he is feeling better and as he allows. Hopefully can have good conversation prior to d/c.   3. Symptom Management:  1. Nausea/Vomiting-worse today.  Will add scheduled zofran. Encouraged PRN use if needed.  Mirtazapine for sleep/anorexia which may help as well.  2. Dysphagia/Reflux- s/p PEG. Agree with protonix BID and carafate. Reports that alka-seltzer worked best. I will give him PRN maalox to see if this helps.  3. Pruritis- after oxycodone.  Will try rotating to liquid dilaudid which may help Dilaudid 2-4mg  is similar dose to oxy 5-10 and should give adequate relief of pain 4. Abd Pain- rotate to dilaudid as above for itching. Mostly around g-tube site.  May need IR to investigate as well.  5. Adjustment D/O vs Depression- Start mirtazapine which should help with his sleep as well.   4. Psychosocial/Spiritual: Lives at home alone. Several brothers/sisters in [place] whom he is close with. Also has niece who helps him. No kids and not married. Feels like he is in good place spiritually.   Doran Clay D.O. Palliative Medicine Team at [facility]  Pager: [phone] Team Phone: [phone]

## 2013-08-25 ENCOUNTER — Other Ambulatory Visit: Payer: BC Managed Care – PPO

## 2013-08-25 ENCOUNTER — Encounter: Payer: BC Managed Care – PPO | Admitting: Nutrition

## 2013-08-25 ENCOUNTER — Ambulatory Visit: Payer: BC Managed Care – PPO

## 2013-08-25 DIAGNOSIS — B370 Candidal stomatitis: Secondary | ICD-10-CM

## 2013-08-25 LAB — BASIC METABOLIC PANEL
ANION GAP: 11 (ref 5–15)
BUN: 3 mg/dL — ABNORMAL LOW (ref 6–23)
CHLORIDE: 99 meq/L (ref 96–112)
CO2: 25 meq/L (ref 19–32)
Calcium: 9.3 mg/dL (ref 8.4–10.5)
Creatinine, Ser: 0.8 mg/dL (ref 0.50–1.35)
GFR calc non Af Amer: >90 mL/min (ref >90)
Glucose, Bld: 108 mg/dL — ABNORMAL HIGH (ref 70–99)
POTASSIUM: 4.4 meq/L (ref 3.7–5.3)
Sodium: 135 mEq/L — ABNORMAL LOW (ref 137–147)

## 2013-08-25 MED ORDER — OSMOLITE 1.5 CAL PO LIQD
1000.0000 mL | ORAL | Status: DC
  Administered 2013-08-27: 1000 mL
  Filled 2013-08-25 (×2): qty 1000

## 2013-08-25 NOTE — Progress Notes (Signed)
Devin Gonzalez   DOB:1955-03-20   BJ#:[id number]   HYQ#:[id number]  Subjective: He reports decreased nausea without any episodes of vomiting.  He requests to eat something something like mashed potatoes.   Objective:  Filed Vitals:   08/25/13 0511  BP: 148/73  Pulse: 65  Temp: 98.9 F (37.2 C)  Resp: 16    Body mass index is 15.92 kg/(m^2).  Intake/Output Summary (Last 24 hours) at 08/25/13 0956 Last data filed at 08/25/13 0817  Gross per 24 hour  Intake   2290 ml  Output   2201 ml  Net     89 ml    Chronically ill appearing; AAOx3. Alopecia; thin.   Sclerae unicteric  Lungs clear -- no rales or rhonchi in anterior fields  Heart regular rate and rhythm  Abdomen + BS, +PEG, no distention  MSK no peripheral edema  Neuro nonfocal  R Port in place Labs:  Lab Results  Component Value Date   WBC 6.2 08/23/2013   HGB 9.2* 08/23/2013   HCT 27.6* 08/23/2013   MCV 86.5 08/23/2013   PLT 356 08/23/2013   NEUTROABS 4.1 08/22/2013   Basic Metabolic Panel:  Recent Labs Lab 08/22/13 0011 08/22/13 0438 08/23/13 0405 08/25/13 0500  NA 141 143 141 135*  K 3.6* 4.2 3.5* 4.4  CL 99 107 107 99  CO2 25 27 23 25   GLUCOSE 125* 117* 108* 108*  BUN 11 10 6  3*  CREATININE 1.03 0.88 0.82 0.80  CALCIUM 10.3 8.9 8.5 9.3   GFR Estimated Creatinine Clearance: 61.4 ml/min (by C-G formula based on Cr of 0.8). Liver Function Tests:  Recent Labs Lab 08/22/13 0011 08/23/13 0405  AST 35 24  ALT 32 19  ALKPHOS 78 56  BILITOT 0.5 0.4  PROT 7.9 5.7*  ALBUMIN 3.8 2.5*    Recent Labs Lab 08/22/13 0011  LIPASE 14   CBC:  Recent Labs Lab 08/22/13 0011 08/22/13 0438 08/23/13 0405  WBC 6.7 5.1 6.2  NEUTROABS 4.1  --   --   HGB 11.9* 9.7* 9.2*  HCT 35.1* 28.9* 27.6*  MCV 84.6 86.3 86.5  PLT 409* 337 356   Cardiac Enzymes:  Recent Labs Lab 08/22/13 0011 08/22/13 0438 08/22/13 1000 08/22/13 1530  TROPONINI <0.30 <0.30 <0.30 <0.30   Studies:  No results  found.  Assessment/Plan: 58 y.o.  1. Refractory nausea/vomiting secondary to to chemotherapy, improving.   --Please hold chemotherapy secondary to n/v.  Continue supportive care measures with intravenous hydration and anti-emetics. On zofran 4 mg every 6 hours prn and promethazine 25 mg q 6 hours.    --Appreciate palliative care assistance.   2. Burning, GI discomfort secondary to malignancy plus/minus chemotherapy, improving.  --Continue protonix 40 mg q 12 hours.   --Continue sucralfate 1 gram tid.   3. 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 It is based clinical trials demonstrating that outcomes were comparable when capecitabine was substituted for infusional 5-FU in the ECF regimen, a finding that was reinforced in a subsequent meta-analysis of this and one other trial. It consists of the following:     --Epirubicin 50 mg per meter squared IV on day one         --Oxaliplatin 130 mg per meter squared IV on day one    --Capecitabine 625 mg per meter squared oral on days 1 through  21 every 21 days.   --We will plan on 6-7 cycles of treatment or unacceptable toxicity. References are as follows: Kathreen Cosier, et. Al, NEJM 6440;347:42 and Sumpter K, et al. Br J Cancer 2005; 59:5638. We will hold Capecitabine in the setting of #1 and #2. Given his dysphagia and weight lost, he saw Dr. Basilio Cairo in consideration of focal palliative XRT to his esophogeal mass. He was started on 06/08 and finished on 06/22.  He has evidence of bone metastases, and bisphosphonate therapy will also be considered. He had an MRI of brain with a solitary brain metastasis.   --At his request, we will facilitate a second-opinion evaluation at [facility], [place]).  He is ECOG 2.   4. Bone and brain metastases, Visual Changes, resolved.  --He had an MRI of  brain as noted above with one isolated brain metastases. Single isolated brain met (3 mm in size) is unlikely to cause his visual changes.  He had truebeam stereotactic radiation treatment per Dr. Basilio Cairo on 08/11/2013.   5. 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  --He was evaluated by audiology with significant hearing lost. Recommended to stop cisplatin. He is being evaluated for hearing aide placement.   6. Pain in back and chest likely secondary to malignancy and above #1, #2.  --Continue dilaudid and morphine prn.   7 S/p PEG (07/24/2013) secondary to anorexia and odynophagia secondary to #1.  --He was referred to nutrition to further optimize his nutrition given his rapid weight lost. He will continue anti nausea medications.  --Consider a trial of soft foods like mashed potatoes at his request.   8. Disposition. Full code. Likely discharge tomorrow or 08/26.  We will hold chemotherapy until evaluated by [facility], [place]).     Lois Ostrom, MD 08/25/2013  9:56 AM

## 2013-08-25 NOTE — Progress Notes (Signed)
Attempted to start tube feedings per order but when PEG was unplugged gastric residual began to free flow out. 300cc out before re-plugged to page MD. Pt complained of no nausea but mild abd pain. MD ordered to hold tube feedings for this evening. Will continue to monitor. Callie Fielding RN

## 2013-08-25 NOTE — Progress Notes (Signed)
[name] PROGRESS NOTE   Devin Gonzalez UKG:[id number] DOB: 1955-09-30 DOA: 08/21/2013 PCP: [name], MD  HPI/Subjective: Started on bolus tube feeding yesterday with boost plus, still nauseous but no vomiting. Asked for soft diet, will start. I will start his nocturnal tube feeding tonight, if he does well without vomiting can be discharged in the morning.  Assessment/Plan: Principal Problem:   Intractable nausea and vomiting Active Problems:   Malignant neoplasm of lower third of esophagus   Protein-calorie malnutrition, severe   Oral pharyngeal candidiasis   GERD (gastroesophageal reflux disease)    . Intractable nausea and vomiting  -Suspect related to chemotherapy and underlying malignancy.  -Abdominal x-ray showed no evidence of acute events, might be constipation. -For now, keep n.p.o., scheduled Zofran, PPI, IV fluids and follow clinical course.  -Start tube feeding, start on soft diet, monitor for nausea/vomiting.  . Protein-calorie malnutrition, severe  -Resume supplements once oral intake/PEG feeds resumed.   . Oral pharyngeal candidiasis  -Currently on fluconazole as outpatient-would change to IV but continue.   Marland Kitchen GERD (gastroesophageal reflux disease)  -Placed on IV PPI   . Malignant neoplasm of lower third of esophagus  -Currently getting chemotherapy, and radiation treatments.   Code Status: Full code Family Communication: Plan discussed with the patient. Disposition Plan: Remains inpatient   Consultants:  Oncology.  Palliative medicine team  Procedures:  None  Antibiotics:  None   Objective: Filed Vitals:   08/25/13 0511  BP: 148/73  Pulse: 65  Temp: 98.9 F (37.2 C)  Resp: 16    Intake/Output Summary (Last 24 hours) at 08/25/13 1059 Last data filed at 08/25/13 0817  Gross per 24 hour  Intake   2290 ml  Output   2201 ml  Net     89 ml   Filed Weights   08/22/13 0344  Weight: 42.1 kg (92 lb 13 oz)     Exam: General: Alert and awake, oriented x3, not in any acute distress. HEENT: anicteric sclera, pupils reactive to light and accommodation, EOMI CVS: S1-S2 clear, no murmur rubs or gallops Chest: clear to auscultation bilaterally, no wheezing, rales or rhonchi Abdomen: soft nontender, nondistended, normal bowel sounds, no organomegaly Extremities: no cyanosis, clubbing or edema noted bilaterally Neuro: Cranial nerves II-XII intact, no focal neurological deficits  Data Reviewed: Basic Metabolic Panel:  Recent Labs Lab 08/22/13 0011 08/22/13 0438 08/23/13 0405 08/25/13 0500  NA 141 143 141 135*  K 3.6* 4.2 3.5* 4.4  CL 99 107 107 99  CO2 25 27 23 25   GLUCOSE 125* 117* 108* 108*  BUN 11 10 6  3*  CREATININE 1.03 0.88 0.82 0.80  CALCIUM 10.3 8.9 8.5 9.3   Liver Function Tests:  Recent Labs Lab 08/22/13 0011 08/23/13 0405  AST 35 24  ALT 32 19  ALKPHOS 78 56  BILITOT 0.5 0.4  PROT 7.9 5.7*  ALBUMIN 3.8 2.5*    Recent Labs Lab 08/22/13 0011  LIPASE 14   No results found for this basename: AMMONIA,  in the last 168 hours CBC:  Recent Labs Lab 08/22/13 0011 08/22/13 0438 08/23/13 0405  WBC 6.7 5.1 6.2  NEUTROABS 4.1  --   --   HGB 11.9* 9.7* 9.2*  HCT 35.1* 28.9* 27.6*  MCV 84.6 86.3 86.5  PLT 409* 337 356   Cardiac Enzymes:  Recent Labs Lab 08/22/13 0011 08/22/13 0438 08/22/13 1000 08/22/13 1530  TROPONINI <0.30 <0.30 <0.30 <0.30   BNP (last 3 results) No results found for  this basename: PROBNP,  in the last 8760 hours CBG: No results found for this basename: GLUCAP,  in the last 168 hours  Micro Recent Results (from the past 240 hour(s))  URINE CULTURE     Status: None   Collection Time    08/21/13 11:55 PM      Result Value Ref Range Status   Specimen Description URINE, RANDOM   Final   Special Requests NONE   Final   Culture  Setup Time     Final   Value: 08/22/2013 11:06     Performed at [facility]      Final   Value: NO GROWTH     Performed at [facility]   Culture     Final   Value: NO GROWTH     Performed at [facility]   Report Status 08/23/2013 FINAL   Final  CULTURE, BLOOD (ROUTINE X 2)     Status: None   Collection Time    08/22/13 12:04 AM      Result Value Ref Range Status   Specimen Description BLOOD RIGHT HAND   Final   Special Requests BOTTLES DRAWN AEROBIC AND ANAEROBIC [facility]   Final   Culture  Setup Time     Final   Value: 08/22/2013 05:04     Performed at [facility]   Culture     Final   Value:        BLOOD CULTURE RECEIVED NO GROWTH TO DATE CULTURE WILL BE HELD FOR 5 DAYS BEFORE ISSUING A FINAL NEGATIVE REPORT     Performed at [facility]   Report Status PENDING   Incomplete  CULTURE, BLOOD (ROUTINE X 2)     Status: None   Collection Time    08/22/13 12:11 AM      Result Value Ref Range Status   Specimen Description BLOOD LEFT FOREARM   Final   Special Requests BOTTLES DRAWN AEROBIC AND ANAEROBIC 6CC   Final   Culture  Setup Time     Final   Value: 08/22/2013 05:04     Performed at [facility]   Culture     Final   Value:        BLOOD CULTURE RECEIVED NO GROWTH TO DATE CULTURE WILL BE HELD FOR 5 DAYS BEFORE ISSUING A FINAL NEGATIVE REPORT     Performed at [facility]   Report Status PENDING   Incomplete     Studies: No results found.  Scheduled Meds: . fluconazole (DIFLUCAN) IV  100 mg Intravenous Q0600  . heparin  5,000 Units Subcutaneous 3 times per day  . lactose free nutrition  237 mL Per Tube TID WC  . metoCLOPramide  10 mg Per Tube TID AC  . mirtazapine  15 mg Oral QHS  . nystatin  5 mL Oral BID  . ondansetron (ZOFRAN) IV  4 mg Intravenous 3 times per day  . pantoprazole (PROTONIX) IV  40 mg Intravenous Q12H  . polyethylene glycol  17 g Per Tube Daily  . sennosides  10 mL Per Tube BID  . sodium chloride  10-40 mL Intracatheter Q12H  . sodium chloride  3 mL Intravenous Q12H  . sucralfate  1 g  Oral TID WC & HS   Continuous Infusions: . dextrose 5 % and 0.45% NaCl 1,000 mL with potassium chloride 40 mEq infusion 100 mL/hr at 08/24/13 2033       Time  spent: 35 minutes    [facility]  [name] Pager [phone] If 7PM-7AM, please contact night-coverage at [email or web address], password [facility] 08/25/2013, 10:59 AM  LOS: 4 days

## 2013-08-25 NOTE — Progress Notes (Signed)
Patient QP:RFFMBWG T [place]en      DOB: 1955/03/18      YKZ:[id number]   Palliative Medicine Team at [facility] Progress Note    Subjective: Feeling much better this morning. Wants to try some food.  Had some nutrition via PEG last night and tolerated.  Pain is better controlled. Not feeling nauseated at moment.  Rotation to dilaudid has helped itching some.  Burning sensation in throat improving.  Moved bowels again yesterday.  Discussed advance care planning as below.     Filed Vitals:   08/25/13 0511  BP: 148/73  Pulse: 65  Temp: 98.9 F (37.2 C)  Resp: 16   Physical exam: GEN: alert, NAD  HEENT: [place], dry mm  CV: RRR  LUNGS: CTAB, symm exp  ABD: soft, tenderness around PEG.  Dressing is c/d/i  EXT: no c/c/e  SKIN: warm/dry   Assessment and plan: 58 yo male with PMHx of 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 Presented with uncontrolled N/V.   1. Code Status: Full  - Discussed advance care planning as below. Will need further follow-up discussion in future.  At this point, he is at least willing to say that he would not want resuscitation efforts to continue (ie continued ventilation, etc). If he did not have reasonable chance of getting off ventilators, etc in several days.  Will need further clarification in future and he will discuss with family this weekend.  2. Goals of Care:  See initial consult. Wants to explore 2nd opinion at [facility]. He tells me today that if he were to get sicker from his cancer or other illness and couldn't make his own decisions, his HCR's should be his sister Walker Sitar and backup would be his niece Melynda Ripple.  He plans on discussing advance care plans and goals with them this weekend. Instructed Johan that it would be wise to get legal documentation of this with HCR forms after he discusses with them this weekend.    3. Symptom Management:  1. Nausea/Vomiting-scheduled zofran IV, reglan.   PRn phenergan as well. remeron likely helping.  Will advance diet today.  My recommendation would be to switch to zofran ODT scheduled tomorrow if tolerates. If doing well, I think there is reasonable chance he could go home tomorrow. 2. Dysphagia/Reflux- s/p PEG. Agree with protonix BID and carafate.PRN maalox. improving 3. Pruritis- improving with rotation to dilaudid. Continue PRN benadryl. Would replace outpatient oxycodone with dilaudid.   4. Abd Pain- better controlled. Continue dilaudid 2-4mg  via g-tube PRN.  5. Adjustment D/O vs Depression- Start mirtazapine which should help with his sleep as well.   4. Psychosocial/Spiritual: Several brothers/sisters in [place] whom he is close with. Also has niece who helps him. No kids and not married. Feels like he is in good place spiritually. Formerly lived in [place].  Lives with his girlfriend Hassan Rowan.      Time In: 1100 Time Out: 1140 Total Time: 40 minutes  >50% of time spent in counseling and coordination of care regarding above plan.   Doran Clay D.O. Palliative Medicine Team at [facility]  Pager: [phone] Team Phone: [phone]

## 2013-08-25 NOTE — Progress Notes (Signed)
NUTRITION FOLLOW UP  Intervention:   -Recommend Boost Plus TID via Gtube, each supplement provides 360 kcal, and 14 gram protein -Initiate home nocturnal TF regimen, can increase advancement rate as tolerated:  -Osmolite 1.5 @ 20 ml/hr via PEG and increase by 10 ml every 24 hours to goal rate of 75 ml/hr for 14 hours. Initiate at 6pm - 8am -Diet advancement per MD (Soft diet per oncology) -Will continue to monitor  Nutrition Dx:   Inadequate oral intake related to nausea/vomiting as evidenced by PO intake <50%, ongoing weight loss.- ongoing    Goal:   TF + PO intake to meet >/= 90% of their estimated nutrition needs- progressing  Monitor:   TF tolerance, diet order, total protein/energy intake, labs, weights, GI profile, swallow profile  Assessment:   8/21: Pt s/p PEG placement on 07/24/2013  -Pt being followed by outpatient oncology RD. Last seen on 08/13/2013. Pt was tolerating Osmolite 1.5 at 75 ml/hr for 14 hours. This regimen provides 1575 kcal, 66 gram protein, and 800 ml free water flushes. Consumed minimal PO intake.  -Weighed 100 lbs, indicating a possible (also likely r/t to dehydration) 8 lb weight loss in < one month (8% body weight, severe for time frame). Pt endoresed an overall unintentional wt loss of 30 lbs over past 3 months (23% body weight loss, also severe for time frame) -Continues to have minimal PO intake, pt reported consuming [employer] occasionally, and will try to consume soups; however it takes him several hours to finish one bowl d/t feelings of early satiety. Pt also with oral candidiasis that inhibits PO intake  -Pt reported to have been tolerating TF regimen two nights ago w/out nausea/vomiting  -Currently NPO d/t frequent nausea/vomiting episodes, receiving Zofran/PPI   8/24: -On 8/23:Boost Plus were administered for bolus feeds TID via Gtube. Has had nausea but no vomiting since admission -Oncology recommended diet be advanced to soft  to allow for soft pleasure feeds (mashed potatoes, etc) -Plan to initiate nocturnal feeds tonight. Recommended conservative advancement d/t nausea and vomiting pta, can increase advancement rate as tolerated  Height: Ht Readings from Last 1 Encounters:  08/22/13 5\' 4"  (1.626 m)    Weight Status:   Wt Readings from Last 1 Encounters:  08/22/13 92 lb 13 oz (42.1 kg)    Re-estimated needs:  Kcal: 1500-1700  Protein: 65-80 gram  Fluid: >/=1500 ml/daily  Skin: WDL  Diet Order: NPO   Intake/Output Summary (Last 24 hours) at 08/25/13 1308 Last data filed at 08/25/13 0817  Gross per 24 hour  Intake   2290 ml  Output   1601 ml  Net    689 ml    Last BM: 8/23   Labs:   Recent Labs Lab 08/22/13 0438 08/23/13 0405 08/25/13 0500  NA 143 141 135*  K 4.2 3.5* 4.4  CL 107 107 99  CO2 27 23 25   BUN 10 6 3*  CREATININE 0.88 0.82 0.80  CALCIUM 8.9 8.5 9.3  GLUCOSE 117* 108* 108*    CBG (last 3)  No results found for this basename: GLUCAP,  in the last 72 hours  Scheduled Meds: . fluconazole (DIFLUCAN) IV  100 mg Intravenous Q0600  . heparin  5,000 Units Subcutaneous 3 times per day  . lactose free nutrition  237 mL Per Tube TID WC  . metoCLOPramide  10 mg Per Tube TID AC  . mirtazapine  15 mg Oral QHS  . nystatin  5 mL Oral BID  .  ondansetron (ZOFRAN) IV  4 mg Intravenous 3 times per day  . pantoprazole (PROTONIX) IV  40 mg Intravenous Q12H  . polyethylene glycol  17 g Per Tube Daily  . sennosides  10 mL Per Tube BID  . sodium chloride  10-40 mL Intracatheter Q12H  . sodium chloride  3 mL Intravenous Q12H  . sucralfate  1 g Oral TID WC & HS    Continuous Infusions: . dextrose 5 % and 0.45% NaCl 1,000 mL with potassium chloride 40 mEq infusion 100 mL/hr at 08/24/13 2033  . feeding supplement (OSMOLITE 1.5 CAL)      Atlee Abide MS RD LDN Clinical Dietitian [phone]

## 2013-08-26 ENCOUNTER — Other Ambulatory Visit: Payer: Self-pay | Admitting: Internal Medicine

## 2013-08-26 ENCOUNTER — Telehealth: Payer: Self-pay | Admitting: Internal Medicine

## 2013-08-26 DIAGNOSIS — R1013 Epigastric pain: Secondary | ICD-10-CM

## 2013-08-26 DIAGNOSIS — C7931 Secondary malignant neoplasm of brain: Secondary | ICD-10-CM

## 2013-08-26 DIAGNOSIS — C7951 Secondary malignant neoplasm of bone: Secondary | ICD-10-CM

## 2013-08-26 DIAGNOSIS — C155 Malignant neoplasm of lower third of esophagus: Secondary | ICD-10-CM

## 2013-08-26 DIAGNOSIS — G893 Neoplasm related pain (acute) (chronic): Secondary | ICD-10-CM

## 2013-08-26 DIAGNOSIS — K6289 Other specified diseases of anus and rectum: Secondary | ICD-10-CM

## 2013-08-26 LAB — BASIC METABOLIC PANEL
Anion gap: 9 (ref 5–15)
BUN: <3 mg/dL — ABNORMAL LOW (ref 6–23)
CO2: 25 meq/L (ref 19–32)
Calcium: 9.5 mg/dL (ref 8.4–10.5)
Chloride: 101 mEq/L (ref 96–112)
Creatinine, Ser: 0.84 mg/dL (ref 0.50–1.35)
GFR calc non Af Amer: >90 mL/min (ref >90)
Glucose, Bld: 116 mg/dL — ABNORMAL HIGH (ref 70–99)
POTASSIUM: 4.9 meq/L (ref 3.7–5.3)
SODIUM: 135 meq/L — AB (ref 137–147)

## 2013-08-26 MED ORDER — GI COCKTAIL ~~LOC~~
30.0000 mL | Freq: Three times a day (TID) | ORAL | Status: DC
  Administered 2013-08-26 – 2013-08-28 (×7): 30 mL via ORAL
  Filled 2013-08-26 (×9): qty 30

## 2013-08-26 MED ORDER — ONDANSETRON 4 MG PO TBDP
4.0000 mg | ORAL_TABLET | Freq: Two times a day (BID) | ORAL | Status: DC
  Administered 2013-08-26 – 2013-08-28 (×5): 4 mg via ORAL
  Filled 2013-08-26 (×7): qty 1

## 2013-08-26 MED ORDER — SODIUM CHLORIDE 0.9 % IV SOLN
INTRAVENOUS | Status: DC
  Administered 2013-08-26: 17:00:00 via INTRAVENOUS

## 2013-08-26 MED ORDER — METOCLOPRAMIDE HCL 5 MG/5ML PO SOLN
10.0000 mg | Freq: Three times a day (TID) | ORAL | Status: DC
  Administered 2013-08-26 – 2013-08-28 (×8): 10 mg
  Filled 2013-08-26 (×11): qty 10

## 2013-08-26 NOTE — Progress Notes (Signed)
Patient BJ:SEGBTDV T Mapps      DOB: [birth year]      VOH:[id number]   Palliative Medicine Team at [facility] Progress Note    Subjective: Had some residual from his tube last night (~343ml) so feeds held.  Continues to have some nausea, abdominal pain, and burning reflux.  Overall feels like things slowly improving.  Tried some mashed potatoes and ginger ale orally but did not taste good and caused some burning.      Filed Vitals:   08/26/13 0433  BP: 147/69  Pulse: 82  Temp: 98.3 F (36.8 C)  Resp: 16   Physical exam: GEN: alert, NAD HEENT: [place], sclera anicteric Skin: warm/dry  Assessment and plan: 58 yo male with PMHx of 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 Presented with uncontrolled N/V.   1. Code Status: Full  - Discussed advance care planning as below. Will need further follow-up discussion in future. At this point, he is at least willing to say that he would not want resuscitation efforts to continue (ie continued ventilation, etc), if he did not have reasonable chance of getting off ventilators, etc in several days. Will need further clarification in future and he will discuss with family this weekend.   2. Goals of Care:  See initial consult. Wants to explore 2nd opinion at [facility]. He tells me that if he were to get sicker from his cancer or other illness and couldn't make his own decisions, his HCR's should be his sister Josafat Enrico and backup would be his niece Melynda Ripple. He plans on discussing advance care plans and goals with them this weekend. Instructed Kinley that it would be wise to get legal documentation of this with HCR forms after he discusses with them this weekend.   3. Symptom Management:  1. Nausea/Vomiting-improved some but still present. With gastric residual at night, will increase reglan to QID.  He missed dose last night (not sure how much this made impact on residual). Switch zofran to ODT  to see if he can tolerate nausea off IV meds.  2. Dysphagia/Reflux- s/p PEG. Agree with protonix BID and carafate. GI cocktail.  3. Pruritis- improving with rotation to dilaudid. Continue PRN benadryl. Would replace outpatient oxycodone with dilaudid.  4. Abd Pain- better controlled. Continue dilaudid 2-4mg  via g-tube PRN. Could consider IR eval of PEG though I don't have strong suspicion that there is problem with tube.  He states that pain/nausea issues have been present since he had placed.  5. Adjustment D/O vs Depression- Started mirtazapine which should help with his sleep as well.   4. Psychosocial/Spiritual: Several brothers/sisters in [place] whom he is close with. Also has niece who helps him. No kids and not married. Feels like he is in good place spiritually. Formerly lived in [place]. Lives with his girlfriend Hassan Rowan.   Time In: 1120 Time Out: 1145 Total Time: 25 minutes  >50% of time spent in counseling and coordination of care regarding above assessment and plan  Doran Clay D.O. Palliative Medicine Team at [facility]  Pager: [phone] Team Phone: [phone]

## 2013-08-26 NOTE — Progress Notes (Signed)
Assumed care of pt. No change in initial am assessment at this time. Cont with plan of care

## 2013-08-26 NOTE — Progress Notes (Signed)
Pt was up and walking when I arrived and sat on the side of the bed during our visit. Pt said he is hoping to be able to eat something soft this evening and go home tomorrow. Pt said he has been here since Thursday. Pt enjoyed talking about family and told me about his sister who passed 3-4 yrs ago of lung cancer. Pt has another sister who helps him now. Pt enjoyed talking about nephew. He said of his health that he takes one day at a time; he wants to take things slow until they see what works. Pt appreciated visit and asked for a return visit while he is here. Ernest Haber Chaplain  08/26/13 1200  Clinical Encounter Type  Visited With Patient

## 2013-08-26 NOTE — Progress Notes (Signed)
Devin Gonzalez   DOB:July 22, 1955   WU#:[id number]   GNF#:[id number]  Subjective: He reports that he ate 3 spoonfuls of potatoes but had worsening burning.  He is tolerating his tube feeds. He reports feeling better overall.  He is on NS 100 cc/hour.   Objective:  Filed Vitals:   08/26/13 0433  BP: 147/69  Pulse: 82  Temp: 98.3 F (36.8 C)  Resp: 16    Body mass index is 15.92 kg/(m^2).  Intake/Output Summary (Last 24 hours) at 08/26/13 0847 Last data filed at 08/26/13 0756  Gross per 24 hour  Intake   2650 ml  Output   2325 ml  Net    325 ml    Chronically ill appearing; AAOx3. Alopecia; thin.   Sclerae unicteric  Lungs clear -- no rales or rhonchi in anterior fields  Heart regular rate and rhythm  Abdomen + BS, +PEG, no distention  MSK no peripheral edema  Neuro nonfocal  R Port in place Labs:  Lab Results  Component Value Date   WBC 6.2 08/23/2013   HGB 9.2* 08/23/2013   HCT 27.6* 08/23/2013   MCV 86.5 08/23/2013   PLT 356 08/23/2013   NEUTROABS 4.1 08/22/2013   Basic Metabolic Panel:  Recent Labs Lab 08/22/13 0011 08/22/13 0438 08/23/13 0405 08/25/13 0500 08/26/13 0355  NA 141 143 141 135* 135*  K 3.6* 4.2 3.5* 4.4 4.9  CL 99 107 107 99 101  CO2 25 27 23 25 25   GLUCOSE 125* 117* 108* 108* 116*  BUN 11 10 6  3* <3*  CREATININE 1.03 0.88 0.82 0.80 0.84  CALCIUM 10.3 8.9 8.5 9.3 9.5   GFR Estimated Creatinine Clearance: 58.5 ml/min (by C-G formula based on Cr of 0.84). Liver Function Tests:  Recent Labs Lab 08/22/13 0011 08/23/13 0405  AST 35 24  ALT 32 19  ALKPHOS 78 56  BILITOT 0.5 0.4  PROT 7.9 5.7*  ALBUMIN 3.8 2.5*    Recent Labs Lab 08/22/13 0011  LIPASE 14   CBC:  Recent Labs Lab 08/22/13 0011 08/22/13 0438 08/23/13 0405  WBC 6.7 5.1 6.2  NEUTROABS 4.1  --   --   HGB 11.9* 9.7* 9.2*  HCT 35.1* 28.9* 27.6*  MCV 84.6 86.3 86.5  PLT 409* 337 356   Cardiac Enzymes:  Recent Labs Lab 08/22/13 0011 08/22/13 0438 08/22/13 1000  08/22/13 1530  TROPONINI <0.30 <0.30 <0.30 <0.30   Studies:  No results found.  Assessment/Plan: 58 y.o.  1. Refractory nausea/vomiting secondary to to chemotherapy, improved.   --We will hold chemotherapy secondary to n/v.  Continue supportive care measures with intravenous hydration and anti-emetics. On zofran 4 mg every 6 hours prn and promethazine 25 mg q 6 hours.    --Appreciate palliative care assistance.   2. Burning, GI discomfort secondary to malignancy plus/minus chemotherapy, improving.  --Continue protonix 40 mg q 12 hours.   --Continue sucralfate 1 gram tid.   3. 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 It is based clinical trials demonstrating that outcomes were comparable when capecitabine was substituted for infusional 5-FU in the ECF regimen, a finding that was reinforced in a subsequent meta-analysis of this and one other trial. It consists of the following:     --Epirubicin 50 mg per meter squared IV on day one         --Oxaliplatin  130 mg per meter squared IV on day one    --Capecitabine 625 mg per meter squared oral on days 1 through 21 every 21 days.   --We will plan on 6-7 cycles of treatment or unacceptable toxicity. References are as follows: Kathreen Cosier, et. Al, NEJM 1610;960:45 and Sumpter K, et al. Br J Cancer 2005; 40:9811. We will hold Capecitabine in the setting of #1 and #2. Given his dysphagia and weight lost, he saw Dr. Basilio Cairo in consideration of focal palliative XRT to his esophogeal mass. He was started on 06/08 and finished on 06/22.  He has evidence of bone metastases, and bisphosphonate therapy will also be considered. He had an MRI of brain with a solitary brain metastasis.   --At his request, we will facilitate a second-opinion evaluation at [facility], [place]).  He is ECOG 2.    4. Bone and brain metastases, Visual Changes, resolved.  --He had an MRI of brain as noted above with one isolated brain metastases. Single isolated brain met (3 mm in size) is unlikely to cause his visual changes.  He had truebeam stereotactic radiation treatment per Dr. Basilio Cairo on 08/11/2013.   5. 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  --He was evaluated by audiology with significant hearing lost. Recommended to stop cisplatin. He is being evaluated for hearing aide placement.   6. Pain in back and chest likely secondary to malignancy and above #1, #2.  --Continue dilaudid and morphine prn.   7 S/p PEG (07/24/2013) secondary to anorexia and odynophagia secondary to #1.  --He was referred to nutrition to further optimize his nutrition given his rapid weight lost. He will continue anti nausea medications.   8. Disposition. Full code. I agree with discharge today.   We will hold chemotherapy until evaluated by [facility], [place]).     Elizzie Westergard, MD 08/26/2013  8:47 AM

## 2013-08-26 NOTE — Progress Notes (Signed)
[name] PROGRESS NOTE   Devin Gonzalez:[id number] DOB: 05-02-1955 DOA: 08/21/2013 PCP: [name], MD  HPI/Subjective: Has slight nausea but denies any vomiting. Tolerated the boost plus supplements okay. Last night to feeding was not started because of residual of more than 300 mL. Patient has pain around his gastric tube.  Interim summary: 58 year old male with past medical history of esophageal cancer with brain metastasis, currently getting chemotherapy. Patient presents to the hospital with intractable nausea and vomiting which is likely secondary to recent chemotherapy. Patient placed n.p.o. initially, diet reintroduced, patient tolerated 3 cans of boost plus per day, tonight the first day is going to be on nocturnal tube feeding. He was restarted on soft diet but his not able to eat, only since admission 3-4 spoonfuls of mashed potatoes. He likely can be discharged in the morning if he tolerates the external to feeding. Per oncology he is okay to be discharged from their standpoint, they will arrange for outpatient followup with [facility] for second opinion.  Assessment/Plan: Principal Problem:   Intractable nausea and vomiting Active Problems:   Malignant neoplasm of lower third of esophagus   Protein-calorie malnutrition, severe   Oral pharyngeal candidiasis   GERD (gastroesophageal reflux disease)    . Intractable nausea and vomiting  -Suspect related to chemotherapy and underlying malignancy.  -Abdominal x-ray showed no evidence of acute events, might be constipation. -For now, keep n.p.o., scheduled Zofran, PPI, IV fluids and follow clinical course.  -Start tube feeding tonight, started also on soft diet but not tolerating very well. Added GI cocktail.  . Protein-calorie malnutrition, severe  -Resume supplements once oral intake/PEG feeds resumed.   . Oral pharyngeal candidiasis  -Currently on fluconazole as outpatient-would change to IV but  continue.   Marland Kitchen GERD (gastroesophageal reflux disease)  -Placed on IV PPI   . Malignant neoplasm of lower third of esophagus  -Currently getting chemotherapy, and radiation treatments.   Code Status: Full code Family Communication: Plan discussed with the patient. Disposition Plan: Remains inpatient   Consultants:  Oncology.  Palliative medicine team  Procedures:  None  Antibiotics:  None   Objective: Filed Vitals:   08/26/13 1339  BP: 150/92  Pulse: 99  Temp: 98.4 F (36.9 C)  Resp: 16    Intake/Output Summary (Last 24 hours) at 08/26/13 1600 Last data filed at 08/26/13 1500  Gross per 24 hour  Intake   2570 ml  Output   2025 ml  Net    545 ml   Filed Weights   08/22/13 0344  Weight: 42.1 kg (92 lb 13 oz)    Exam: General: Alert and awake, oriented x3, not in any acute distress. HEENT: anicteric sclera, pupils reactive to light and accommodation, EOMI CVS: S1-S2 clear, no murmur rubs or gallops Chest: clear to auscultation bilaterally, no wheezing, rales or rhonchi Abdomen: soft nontender, nondistended, normal bowel sounds, no organomegaly Extremities: no cyanosis, clubbing or edema noted bilaterally Neuro: Cranial nerves II-XII intact, no focal neurological deficits  Data Reviewed: Basic Metabolic Panel:  Recent Labs Lab 08/22/13 0011 08/22/13 0438 08/23/13 0405 08/25/13 0500 08/26/13 0355  NA 141 143 141 135* 135*  K 3.6* 4.2 3.5* 4.4 4.9  CL 99 107 107 99 101  CO2 25 27 23 25 25   GLUCOSE 125* 117* 108* 108* 116*  BUN 11 10 6  3* <3*  CREATININE 1.03 0.88 0.82 0.80 0.84  CALCIUM 10.3 8.9 8.5 9.3 9.5   Liver Function Tests:  Recent Labs Lab 08/22/13  0011 08/23/13 0405  AST 35 24  ALT 32 19  ALKPHOS 78 56  BILITOT 0.5 0.4  PROT 7.9 5.7*  ALBUMIN 3.8 2.5*    Recent Labs Lab 08/22/13 0011  LIPASE 14   No results found for this basename: AMMONIA,  in the last 168 hours CBC:  Recent Labs Lab 08/22/13 0011 08/22/13 0438  08/23/13 0405  WBC 6.7 5.1 6.2  NEUTROABS 4.1  --   --   HGB 11.9* 9.7* 9.2*  HCT 35.1* 28.9* 27.6*  MCV 84.6 86.3 86.5  PLT 409* 337 356   Cardiac Enzymes:  Recent Labs Lab 08/22/13 0011 08/22/13 0438 08/22/13 1000 08/22/13 1530  TROPONINI <0.30 <0.30 <0.30 <0.30   BNP (last 3 results) No results found for this basename: PROBNP,  in the last 8760 hours CBG: No results found for this basename: GLUCAP,  in the last 168 hours  Micro Recent Results (from the past 240 hour(s))  URINE CULTURE     Status: None   Collection Time    08/21/13 11:55 PM      Result Value Ref Range Status   Specimen Description URINE, RANDOM   Final   Special Requests NONE   Final   Culture  Setup Time     Final   Value: 08/22/2013 11:06     Performed at [place]     Final   Value: NO GROWTH     Performed at [facility]   Culture     Final   Value: NO GROWTH     Performed at [facility]   Report Status 08/23/2013 FINAL   Final  CULTURE, BLOOD (ROUTINE X 2)     Status: None   Collection Time    08/22/13 12:04 AM      Result Value Ref Range Status   Specimen Description BLOOD RIGHT HAND   Final   Special Requests BOTTLES DRAWN AEROBIC AND ANAEROBIC [facility]   Final   Culture  Setup Time     Final   Value: 08/22/2013 05:04     Performed at [facility]   Culture     Final   Value:        BLOOD CULTURE RECEIVED NO GROWTH TO DATE CULTURE WILL BE HELD FOR 5 DAYS BEFORE ISSUING A FINAL NEGATIVE REPORT     Performed at [facility]   Report Status PENDING   Incomplete  CULTURE, BLOOD (ROUTINE X 2)     Status: None   Collection Time    08/22/13 12:11 AM      Result Value Ref Range Status   Specimen Description BLOOD LEFT FOREARM   Final   Special Requests BOTTLES DRAWN AEROBIC AND ANAEROBIC 6CC   Final   Culture  Setup Time     Final   Value: 08/22/2013 05:04     Performed at [facility]   Culture     Final   Value:         BLOOD CULTURE RECEIVED NO GROWTH TO DATE CULTURE WILL BE HELD FOR 5 DAYS BEFORE ISSUING A FINAL NEGATIVE REPORT     Performed at [facility]   Report Status PENDING   Incomplete     Studies: No results found.  Scheduled Meds: . fluconazole (DIFLUCAN) IV  100 mg Intravenous Q0600  . gi cocktail  30 mL Oral TID AC  . heparin  5,000 Units Subcutaneous 3 times per day  .  lactose free nutrition  237 mL Per Tube TID WC  . metoCLOPramide  10 mg Per Tube TID AC & HS  . mirtazapine  15 mg Oral QHS  . nystatin  5 mL Oral BID  . ondansetron  4 mg Oral Q12H  . pantoprazole (PROTONIX) IV  40 mg Intravenous Q12H  . polyethylene glycol  17 g Per Tube Daily  . sennosides  10 mL Per Tube BID  . sodium chloride  10-40 mL Intracatheter Q12H  . sodium chloride  3 mL Intravenous Q12H  . sucralfate  1 g Oral TID WC & HS   Continuous Infusions: . dextrose 5 % and 0.45% NaCl 1,000 mL with potassium chloride 40 mEq infusion 100 mL/hr at 08/26/13 1110  . feeding supplement (OSMOLITE 1.5 CAL) Stopped (08/25/13 1821)       Time spent: 35 minutes    [facility]  [name] Pager [phone] If 7PM-7AM, please contact night-coverage at [email or web address], password [facility] 08/26/2013, 4:00 PM  LOS: 5 days

## 2013-08-27 ENCOUNTER — Telehealth: Payer: Self-pay

## 2013-08-27 ENCOUNTER — Ambulatory Visit: Payer: BC Managed Care – PPO

## 2013-08-27 LAB — BASIC METABOLIC PANEL
Anion gap: 10 (ref 5–15)
BUN: 3 mg/dL — ABNORMAL LOW (ref 6–23)
CHLORIDE: 101 meq/L (ref 96–112)
CO2: 26 meq/L (ref 19–32)
Calcium: 9.4 mg/dL (ref 8.4–10.5)
Creatinine, Ser: 0.94 mg/dL (ref 0.50–1.35)
GFR calc Af Amer: >90 mL/min (ref >90)
GFR calc non Af Amer: >90 mL/min (ref >90)
Glucose, Bld: 101 mg/dL — ABNORMAL HIGH (ref 70–99)
Potassium: 4.6 mEq/L (ref 3.7–5.3)
SODIUM: 137 meq/L (ref 137–147)

## 2013-08-27 NOTE — Progress Notes (Signed)
PROGRESS NOTE  Devin Gonzalez EXH:[id number] DOB: [birth year] DOA: 08/21/2013 PCP: [name], MD  Subjective/ 24 H Interval events - did not have nocturnal feeds last night - complains of chest burning every time he is eating  Assessment/Plan: Intractable nausea and vomiting -Suspect related to chemotherapy and underlying malignancy.  - Abdominal x-ray showed no evidence of acute events, might be constipation.  - Start tube feeding tonight and monitor. Protein-calorie malnutrition, severe - Resume supplements once oral intake/PEG feeds resumed. Oral pharyngeal candidiasis -Currently on fluconazole as outpatient-would change to IV but continue.  GERD (gastroesophageal reflux disease) -Placed on IV PPI  Malignant neoplasm of lower third of esophagus -Currently getting chemotherapy, and radiation treatments. - will be evaluated at [facility] next week  Diet: soft Fluids: none DVT Prophylaxis: heparin  Code Status: Full Family Communication: d/w patient   Disposition Plan: home if tolerating overnight tube feeding  Consultants:  Oncology  Palliative care   Procedures:  None    Antibiotics  Anti-infectives   Start     Dose/Rate Route Frequency Ordered Stop   08/22/13 0415  fluconazole (DIFLUCAN) IVPB 100 mg     100 mg 50 mL/hr over 60 Minutes Intravenous Daily 08/22/13 0353       Antibiotics Given (last 72 hours)   None      Studies  Filed Vitals:   08/26/13 1339 08/26/13 2120 08/27/13 0500 08/27/13 1431  BP: 150/92 139/89 137/65 185/79  Pulse: 99 82 75 76  Temp: 98.4 F (36.9 C) 98.5 F (36.9 C) 97.7 F (36.5 C) 98.9 F (37.2 C)  TempSrc: Oral Oral Oral Oral  Resp: 16 18 16    Height:      Weight:      SpO2: 99% 100% 100% 100%    Intake/Output Summary (Last 24 hours) at 08/27/13 1629 Last data filed at 08/27/13 1602  Gross per 24 hour  Intake 1661.25 ml  Output   1450 ml  Net 211.25 ml   Filed Weights   08/22/13 0344  Weight: 42.1 kg (92  lb 13 oz)    Exam:  General:  NAD, cachectic  Cardiovascular: RRR  Respiratory: CTA biL  Abdomen: PEG in place, diffuse discomfort to palpation   Data Reviewed: Basic Metabolic Panel:  Recent Labs Lab 08/22/13 0438 08/23/13 0405 08/25/13 0500 08/26/13 0355 08/27/13 0334  NA 143 141 135* 135* 137  K 4.2 3.5* 4.4 4.9 4.6  CL 107 107 99 101 101  CO2 27 23 25 25 26   GLUCOSE 117* 108* 108* 116* 101*  BUN 10 6 3* <3* 3*  CREATININE 0.88 0.82 0.80 0.84 0.94  CALCIUM 8.9 8.5 9.3 9.5 9.4   Liver Function Tests:  Recent Labs Lab 08/22/13 0011 08/23/13 0405  AST 35 24  ALT 32 19  ALKPHOS 78 56  BILITOT 0.5 0.4  PROT 7.9 5.7*  ALBUMIN 3.8 2.5*    Recent Labs Lab 08/22/13 0011  LIPASE 14   No results found for this basename: AMMONIA,  in the last 168 hours CBC:  Recent Labs Lab 08/22/13 0011 08/22/13 0438 08/23/13 0405  WBC 6.7 5.1 6.2  NEUTROABS 4.1  --   --   HGB 11.9* 9.7* 9.2*  HCT 35.1* 28.9* 27.6*  MCV 84.6 86.3 86.5  PLT 409* 337 356   Cardiac Enzymes:  Recent Labs Lab 08/22/13 0011 08/22/13 0438 08/22/13 1000 08/22/13 1530  TROPONINI <0.30 <0.30 <0.30 <0.30   BNP (last 3 results) No results found for this basename:  PROBNP,  in the last 8760 hours CBG: No results found for this basename: GLUCAP,  in the last 168 hours  Recent Results (from the past 240 hour(s))  URINE CULTURE     Status: None   Collection Time    08/21/13 11:55 PM      Result Value Ref Range Status   Specimen Description URINE, RANDOM   Final   Special Requests NONE   Final   Culture  Setup Time     Final   Value: 08/22/2013 11:06     Performed at [place]     Final   Value: NO GROWTH     Performed at [facility]   Culture     Final   Value: NO GROWTH     Performed at [facility]   Report Status 08/23/2013 FINAL   Final  CULTURE, BLOOD (ROUTINE X 2)     Status: None   Collection Time    08/22/13 12:04 AM       Result Value Ref Range Status   Specimen Description BLOOD RIGHT HAND   Final   Special Requests BOTTLES DRAWN AEROBIC AND ANAEROBIC [facility]   Final   Culture  Setup Time     Final   Value: 08/22/2013 05:04     Performed at [facility]   Culture     Final   Value:        BLOOD CULTURE RECEIVED NO GROWTH TO DATE CULTURE WILL BE HELD FOR 5 DAYS BEFORE ISSUING A FINAL NEGATIVE REPORT     Performed at [facility]   Report Status PENDING   Incomplete  CULTURE, BLOOD (ROUTINE X 2)     Status: None   Collection Time    08/22/13 12:11 AM      Result Value Ref Range Status   Specimen Description BLOOD LEFT FOREARM   Final   Special Requests BOTTLES DRAWN AEROBIC AND ANAEROBIC 6CC   Final   Culture  Setup Time     Final   Value: 08/22/2013 05:04     Performed at [facility]   Culture     Final   Value:        BLOOD CULTURE RECEIVED NO GROWTH TO DATE CULTURE WILL BE HELD FOR 5 DAYS BEFORE ISSUING A FINAL NEGATIVE REPORT     Performed at [facility]   Report Status PENDING   Incomplete     Studies: No results found.  Scheduled Meds: . fluconazole (DIFLUCAN) IV  100 mg Intravenous Q0600  . gi cocktail  30 mL Oral TID AC  . heparin  5,000 Units Subcutaneous 3 times per day  . lactose free nutrition  237 mL Per Tube TID WC  . metoCLOPramide  10 mg Per Tube TID AC & HS  . mirtazapine  15 mg Oral QHS  . nystatin  5 mL Oral BID  . ondansetron  4 mg Oral Q12H  . pantoprazole (PROTONIX) IV  40 mg Intravenous Q12H  . polyethylene glycol  17 g Per Tube Daily  . sennosides  10 mL Per Tube BID  . sodium chloride  10-40 mL Intracatheter Q12H  . sodium chloride  3 mL Intravenous Q12H  . sucralfate  1 g Oral TID WC & HS   Continuous Infusions: . sodium chloride 75 mL/hr at 08/26/13 1703  . feeding supplement (OSMOLITE 1.5 CAL) Stopped (08/25/13 1821)    Principal Problem:  Intractable nausea and vomiting Active Problems:   Malignant neoplasm of lower third of  esophagus   Protein-calorie malnutrition, severe   Oral pharyngeal candidiasis   GERD (gastroesophageal reflux disease)   Time spent: 25  This note has been created with Surveyor, quantity. Any transcriptional errors are unintentional.   Marzetta Board, MD [name] Pager [phone]. If 7 PM - 7 AM, please contact night-coverage at [email or web address], password [facility] 08/27/2013, 4:29 PM  LOS: 6 days

## 2013-08-27 NOTE — Telephone Encounter (Signed)
Pt appt. With Dr. Melven Sartorius @ [facility] is 09/05/14@8 :92. Medical records faxed,slides and scans fedex'ed. Pt is aware

## 2013-08-28 ENCOUNTER — Ambulatory Visit: Payer: BC Managed Care – PPO

## 2013-08-28 LAB — CULTURE, BLOOD (ROUTINE X 2)
CULTURE: NO GROWTH
Culture: NO GROWTH

## 2013-08-28 MED ORDER — SENNOSIDES 8.8 MG/5ML PO SYRP: 10.0000 mL | ORAL_SOLUTION | Freq: Two times a day (BID) | ORAL | Status: DC

## 2013-08-28 MED ORDER — MIRTAZAPINE 15 MG PO TBDP: 15.0000 mg | ORAL_TABLET | Freq: Every day | ORAL | Status: AC

## 2013-08-28 MED ORDER — HYDROMORPHONE HCL 1 MG/ML PO LIQD: 2.0000 mg | ORAL | Status: AC | PRN

## 2013-08-28 MED ORDER — METOCLOPRAMIDE HCL 5 MG/5ML PO SOLN: 10.0000 mg | Freq: Three times a day (TID) | ORAL | Status: AC

## 2013-08-28 MED ORDER — GI COCKTAIL ~~LOC~~: 30.0000 mL | Freq: Three times a day (TID) | ORAL | Status: DC

## 2013-08-28 MED ORDER — HEPARIN SOD (PORK) LOCK FLUSH 100 UNIT/ML IV SOLN
500.0000 [IU] | INTRAVENOUS | Status: AC | PRN
  Administered 2013-08-28: 500 [IU]

## 2013-08-28 MED ORDER — HYDROMORPHONE HCL 1 MG/ML PO LIQD: 2.0000 mg | ORAL | Status: DC | PRN

## 2013-08-28 MED ORDER — GI COCKTAIL ~~LOC~~: 30.0000 mL | Freq: Three times a day (TID) | ORAL | Status: AC

## 2013-08-28 MED ORDER — SENNOSIDES 8.8 MG/5ML PO SYRP: 10.0000 mL | ORAL_SOLUTION | Freq: Two times a day (BID) | ORAL | Status: AC

## 2013-08-28 MED ORDER — OSMOLITE 1.5 CAL PO LIQD
1000.0000 mL | ORAL | Status: DC
  Filled 2013-08-28: qty 1000

## 2013-08-28 NOTE — Progress Notes (Signed)
NUTRITION FOLLOW UP  Intervention:   -Recommend Osmolite 1.5 at goal rate of 50 ml/hr for 14 hours (approximately three cans (230ml each) Osmolite 1.5) -Run 6pm-8am. Tube feed regimen provides 1050 kcal (70% est kcal needs), 44 gram protein (73% est protein needs) -Recommend Boost BID (or TID as tolerated) to provide 720 kcal, and 28 gram protein -Encouraged intake of bland, soft, easy to tolerate foods/beverages  Nutrition Dx:   Inadequate oral intake related to nausea/vomiting as evidenced by PO intake <50%, ongoing weight loss.- ongoing    Goal:   TF + PO intake to meet >/= 90% of their estimated nutrition needs- progressing  Monitor:   TF tolerance, diet order, total protein/energy intake, labs, weights, GI profile, swallow profile  Assessment:   8/21: Pt s/p PEG placement on 07/24/2013  -Pt being followed by outpatient oncology RD. Last seen on 08/13/2013. Pt was tolerating Osmolite 1.5 at 75 ml/hr for 14 hours. This regimen provides 1575 kcal, 66 gram protein, and 800 ml free water flushes. Consumed minimal PO intake.  -Weighed 100 lbs, indicating a possible (also likely r/t to dehydration) 8 lb weight loss in < one month (8% body weight, severe for time frame). Pt endoresed an overall unintentional wt loss of 30 lbs over past 3 months (23% body weight loss, also severe for time frame) -Continues to have minimal PO intake, pt reported consuming [employer] occasionally, and will try to consume soups; however it takes him several hours to finish one bowl d/t feelings of early satiety. Pt also with oral candidiasis that inhibits PO intake  -Pt reported to have been tolerating TF regimen two nights ago w/out nausea/vomiting  -Currently NPO d/t frequent nausea/vomiting episodes, receiving Zofran/PPI   8/24: -On 8/23:Boost Plus were administered for bolus feeds TID via Gtube. Has had nausea but no vomiting since admission -Oncology recommended diet be advanced to soft to  allow for soft pleasure feeds (mashed potatoes, etc) -Plan to initiate nocturnal feeds tonight. Recommended conservative advancement d/t nausea and vomiting pta, can increase advancement rate as tolerated  8/27: -Osmolite 1.5 was initiated on 8/26 at 20 ml/hr. Minimal residuals documented. -Pt tolerating Boost Plus BID via PEG tube. Denied significant nausea post bolus. Diet advanced to Soft; however has minimal PO intake and complains of burning chest pain  -Pt reported only tolerating 2 cans Osmolite 1.5 at night prior to admit, which is not consistent with what he had reported to outpatient oncology RD during 8/12 visit when he reported tolerating Osmolite 1.5 at 75 ml/hr for 14 hours -Plan for d/c tomorrow, RN requesting bolus feed recommendations. -Modified nocturnal tube feeding goal rate to 50 ml/hr for 14 hours (approximately 3 cans Osmolite 1.5) as pt now tolerating bolus feeds during the day.   Height: Ht Readings from Last 1 Encounters:  08/22/13 5\' 4"  (1.626 m)    Weight Status:   Wt Readings from Last 1 Encounters:  08/22/13 92 lb 13 oz (42.1 kg)    Re-estimated needs:  Kcal: 1500-1700  Protein: 65-80 gram  Fluid: >/=1500 ml/daily  Skin: WDL  Diet Order: Devin Gonzalez   Intake/Output Summary (Last 24 hours) at 08/28/13 1209 Last data filed at 08/28/13 2297  Gross per 24 hour  Intake 1640.5 ml  Output    700 ml  Net  940.5 ml    Last BM: 8/26   Labs:   Recent Labs Lab 08/25/13 0500 08/26/13 0355 08/27/13 0334  NA 135* 135* 137  K 4.4 4.9 4.6  CL 99 101 101  CO2 25 25 26   BUN 3* <3* 3*  CREATININE 0.80 0.84 0.94  CALCIUM 9.3 9.5 9.4  GLUCOSE 108* 116* 101*    CBG (last 3)  No results found for this basename: GLUCAP,  in the last 72 hours  Scheduled Meds: . fluconazole (DIFLUCAN) IV  100 mg Intravenous Q0600  . gi cocktail  30 mL Oral TID AC  . heparin  5,000 Units Subcutaneous 3 times per day  . lactose free nutrition  237 mL Per Tube TID WC  .  metoCLOPramide  10 mg Per Tube TID AC & HS  . mirtazapine  15 mg Oral QHS  . nystatin  5 mL Oral BID  . ondansetron  4 mg Oral Q12H  . pantoprazole (PROTONIX) IV  40 mg Intravenous Q12H  . polyethylene glycol  17 g Per Tube Daily  . sennosides  10 mL Per Tube BID  . sodium chloride  10-40 mL Intracatheter Q12H  . sodium chloride  3 mL Intravenous Q12H  . sucralfate  1 g Oral TID WC & HS    Continuous Infusions: . feeding supplement (OSMOLITE 1.5 CAL) 1,000 mL (08/27/13 1851)    Atlee Abide MS RD LDN Clinical Dietitian [phone]

## 2013-08-28 NOTE — Clinical Social Work Note (Signed)
[place] Work  Clinical Social Work was referred by Retail buyer at [employer] for follow up- [place] familiar with patient from outpatient setting.  Clinical Social Worker met with patient in hospital to offer support and assess for needs.  Mr. Lundberg shared with CSW he is unsure of what he stands with social security disability process.  CSW contacted Social Security on patient's behalf- they stated his case is currently in the claims appeal office waiting to be assigned to a [place].  They indicated patient would not to contact his lawyer to determine what has been done.  The patient was unaware his lawyer was currently involved- patient will obtain lawyer contact information once he is discharged and communicate to [place] in outpatient setting.  Mr. Petite shared frustrations surrounding his illness.  CSW provided brief emotional support and will continue to follow patient through cancer journey.  Polo Riley, MSW, LCSW, OSW-C Clinical Social Worker [facility] [phone]

## 2013-08-28 NOTE — Progress Notes (Signed)
Pt was sitting up when I arrived. Pt said he may be going home tomorrow and would be glad to get back into his bed. Pt chatted about family and plans after discharge. Pt was appreciative of visit. Will follow-up as needed. Ernest Haber Chaplain  08/28/13 1100  Clinical Encounter Type  Visited With Patient

## 2013-08-28 NOTE — Discharge Summary (Signed)
Physician Discharge Summary  Devin Gonzalez RDE:[id number] DOB: [birth year] DOA: 08/21/2013  PCP: [name], MD  Admit date: 08/21/2013 Discharge date: 08/28/2013  Time spent: 35 minutes  Recommendations for Outpatient Follow-up:  1. Follow up with PCP in 2 weeks 2. Follow up with Dr. Juliann Mule in 1-2 weeks 3. Follow up with Dr. Melven Sartorius from [facility] on 9/3   Discharge Diagnoses:  Principal Problem:   Intractable nausea and vomiting Active Problems:   Malignant neoplasm of lower third of esophagus   Protein-calorie malnutrition, severe   Oral pharyngeal candidiasis   GERD (gastroesophageal reflux disease)  Discharge Condition: stable  Diet recommendation: tube feeds, as tolerated by mouth  Filed Weights   08/22/13 0344  Weight: 42.1 kg (92 lb 13 oz)    History of present illness:  Devin Gonzalez is a 58 y.o. male with a Past Medical History of esophageal cancer with brain metastases- currently getting chemotherapy, and status post radiation treatment to the esophageal mass and also to the brain who presents today with the above noted complaint. Per patient, on 8/16 he had persistent nausea and vomiting that resolved with supportive care. On 8/20 a.m., patient again had recurrence of persistent nausea and vomiting. He claims that he probably vomited more than 20 times on 8/20. He denies any blood in the vomitus. He was not able to keep any of his medications down as well. It is of persistent vomiting, patient was brought to the emergency room, he was found to be dehydrated, I was then asked to admit this patient for further evaluation and treatment. Patient complains of burning sensation to his chest and to his throat area, which he attributes from reflux and persistent vomiting. His last bowel movement was 2 days back- he however is passing gas. He is now being admitted for further evaluation and treatment.  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 Juliann Mule notes. Dr. Juliann Mule has followed patient while hospitalized and patient was currently undergoing chemotherapy and radiation therapy (SRS brain treatment on 8/10) just prior to this admission. Per patient's preference, he wishes to seek a second opinion and will be evaluated at [facility] by Dr. Melven Sartorius next week and meanwhile his chemo will be on hold.  Refractory nausea/vomiting secondary to to chemotherapy, improved, patient started on Reglan with good results. Patient also with significant burning and GI discomfort secondary to malignancy as well as chemotherapy, improving prior to discharge. Due to severe protein malnutrition and #1, he has had a PEG placed in July and is on tube feeding. His PEG seems to be working well.  Oral pharyngeal candidiasis -Currently on fluconazole as outpatient.  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 Back and chest pain likely secondary to malignancy - continue narcotics S/p PEG (07/24/2013) secondary to anorexia and odynophagia  Protein-calorie malnutrition, severe  Procedures:  None    Consultations:  Oncology   Discharge Exam: Filed Vitals:   08/27/13 0500 08/27/13 1431 08/27/13 2100 08/28/13 0506  BP: 137/65 185/79 136/72 120/69  Pulse: 75 76 83 77  Temp: 97.7 F (36.5 C) 98.9 F (37.2 C) 99.8 F (37.7 C) 98.9 F (37.2 C)  TempSrc: Oral Oral Oral Oral  Resp: 16  18 18   Height:      Weight:      SpO2: 100% 100% 100% 97%   General: NAD Cardiovascular: RRR Respiratory: CTA biL  Discharge Instructions  Medication List    STOP taking these medications       capecitabine 500 MG tablet  Commonly known as:  XELODA     Oxycodone HCl 10 MG Tabs      TAKE these medications       aspirin-sod bicarb-citric acid 325 MG Tbef tablet  Commonly known as:  ALKA-SELTZER  Take 325 mg by mouth every 6 (six) hours as needed (acid reflux).      baclofen 10 MG tablet  Commonly known as:  LIORESAL  Take 1 tablet (10 mg total) by mouth 3 (three) times daily.     BENADRYL ALLERGY PO  Take 25 mg by mouth at bedtime as needed (sleep/itching).     dronabinol 2.5 MG capsule  Commonly known as:  MARINOL  Take 1 capsule (2.5 mg total) by mouth 2 (two) times daily before a meal.     feeding supplement Liqd  Take 1 Container by mouth daily.     feeding supplement (OSMOLITE 1.5 CAL) Liqd  Begin Osmolite 1.5 at 40 ml/hr from 6 pm to 6 am with 60 cc free water before and after continuous feeding. Increase 10 ml/hr daily to goal of 90 ml/hr for 12 hours.  Drink or flush tube with additional 240 ml free water TID. SEND feeding pump but DO NOT SEND formula.  Patient has formula.     fluconazole 10 MG/ML suspension  Commonly known as:  DIFLUCAN  Take 100 mg by mouth daily.     gi cocktail Susp suspension  Take 30 mLs by mouth 3 (three) times daily before meals. Shake well.     HYDROmorphone HCl 1 MG/ML Liqd  Commonly known as:  DILAUDID  Place 2-4 mLs (2-4 mg total) into feeding tube every 4 (four) hours as needed for severe pain.     lidocaine 2 % solution  Commonly known as:  XYLOCAINE  Use as directed 20 mLs in the mouth or throat 3 (three) times daily as needed for mouth pain.     lidocaine-prilocaine cream  Commonly known as:  EMLA  Apply 1 application topically as needed (for port access).     LORazepam 0.5 MG tablet  Commonly known as:  ATIVAN  Take 1 tablet (0.5 mg total) by mouth every 6 (six) hours as needed (Nausea or vomiting).     metoCLOPramide 5 MG/5ML solution  Commonly known as:  REGLAN  Place 10 mLs (10 mg total) into feeding tube 4 (four) times daily -  before meals and at bedtime.     mirtazapine 15 MG disintegrating tablet  Commonly known as:  REMERON SOL-TAB  Take 1 tablet (15 mg total) by mouth at bedtime.     montelukast 10 MG tablet  Commonly known as:  SINGULAIR  Take 10 mg by mouth daily.      nicotine 14 mg/24hr patch  Commonly known as:  NICODERM CQ - dosed in mg/24 hours  Place 14 mg onto the skin daily.     nystatin 100000 UNIT/ML suspension  Commonly known as:  MYCOSTATIN  Take 5 mLs by mouth 2 (two) times daily.     ondansetron 8 MG disintegrating tablet  Commonly known as:  ZOFRAN ODT  Take 1 tablet (8 mg total) by mouth every 8 (eight) hours as needed for nausea or vomiting.     pantoprazole 40 MG tablet  Commonly known as:  PROTONIX  Take 40 mg by mouth daily.     prochlorperazine 10 MG tablet  Commonly known as:  COMPAZINE  Take 10 mg by mouth every 6 (six) hours as needed for nausea.     sennosides 8.8 MG/5ML syrup  Commonly known as:  SENOKOT  Place 10 mLs into feeding tube 2 (two) times daily.     sodium phosphate 7-19 GM/118ML Enem  Place 133 mLs (1 enema total) rectally once. Please administer enema rectally x 1     sucralfate 1 GM/10ML suspension  Commonly known as:  CARAFATE  Take 1 g by mouth 4 (four) times daily -  with meals and at bedtime.     VISINE OP  Place 2-4 drops into both eyes 2 (two) times daily as needed. For dry eyes       Follow-up Information   Follow up with Christene Slates, MD On 09/04/2013. (Dr. Melven Sartorius @ [facility] is 09/05/14@8 :63)    Specialty:  Internal Medicine   Contact information:   [facility] [place] [address] [phone]       The results of significant diagnostics from this hospitalization (including imaging, microbiology, ancillary and laboratory) are listed below for reference.    Significant Diagnostic Studies: Dg Chest 2 View  08/17/2013   CLINICAL DATA:  Chest pain.  Esophageal cancer.  EXAM: CHEST  2 VIEW  COMPARISON:  Chest CT 06/09/2013.  FINDINGS: The cardiac silhouette, mediastinal and hilar contours are within normal limits. The power port is in good position. No complicating features. The lungs are clear. No worrisome pulmonary lesions. No pleural effusion or pneumothorax. The bony  thorax is intact.  IMPRESSION: No acute cardiopulmonary findings.   Electronically Signed   By: Kalman Jewels M.D.   On: 08/17/2013 21:33   Dg Abd 1 View  08/22/2013   CLINICAL DATA:  Chest pain and abdominal pain.  EXAM: ABDOMEN - 1 VIEW  COMPARISON:  Abdominal CT 08/17/2013  FINDINGS: A pigtail catheter retention loop overlaps the stomach. T tacks likely present at this location.  Prominent volume of stool in the proximal colon. Numerous distal colonic diverticula with retained barium. No concerning intra-abdominal mass effect or calcification. There is extensive aortic atherosclerotic disease. Lung bases are clear.  IMPRESSION: 1. Possible constipation 2. Numerous colonic diverticula.   Electronically Signed   By: Jorje Guild M.D.   On: 08/22/2013 03:30   Mr Jeri Cos YB Contrast  08/01/2013   CLINICAL DATA:  SRS restaging.  Esophageal cancer.  EXAM: MRI HEAD WITHOUT AND WITH CONTRAST  TECHNIQUE: Multiplanar, multiecho pulse sequences of the brain and surrounding structures were obtained without and with intravenous contrast.  CONTRAST:  93mL MULTIHANCE GADOBENATE DIMEGLUMINE 529 MG/ML IV SOLN  COMPARISON:  06/19/2013  FINDINGS: There is no acute infarct, intracranial hemorrhage, midline shift, or extra-axial fluid collection. There is mild to moderate cerebral atrophy, not significantly changed. Linear focus of intrinsic T1 hyperintensity in the right cerebellum is felt to be artifactual. 3 mm enhancing right frontal lesion is unchanged (series 10, image 120). No new enhancing lesions are identified. There is no significant vasogenic edema associated with the solitary lesion. A few small foci of T2 hyperintensity are noted in the cerebral white matter bilaterally, unchanged and nonspecific.  Orbits are unremarkable. Mild to moderate right maxillary sinus mucosal thickening is noted, similar to prior. Major intracranial vascular flow voids are preserved.  IMPRESSION: Unchanged, 3 mm right frontal lesion,  concerning for solitary metastasis.   Electronically Signed   By: Logan Bores   On: 08/01/2013 17:15   Ct Abdomen Pelvis W  Contrast  08/17/2013   CLINICAL DATA:  Nausea, vomiting. History of esophageal cancer and gastroesophageal reflux disease.  EXAM: CT ABDOMEN AND PELVIS WITH CONTRAST  TECHNIQUE: Multidetector CT imaging of the abdomen and pelvis was performed using the standard protocol following bolus administration of intravenous contrast.  CONTRAST:  9mL OMNIPAQUE IOHEXOL 300 MG/ML SOLN, 161mL OMNIPAQUE IOHEXOL 300 MG/ML SOLN  COMPARISON:  CT of the chest, abdomen and pelvis May 19, 2013  FINDINGS: LUNG BASES: Included view of the lung bases are clear. Visualized heart and pericardium are unremarkable.  SOLID ORGANS: The liver, spleen, gallbladder, pancreas and adrenal glands are unremarkable.  GASTROINTESTINAL TRACT: Again noted is thickened irregular is distal esophagus corresponding to patient's known neoplasm. The stomach, small and large bowel are normal in course and caliber without inflammatory changes. Gastrostomy tube in place, retaining balloon within the proximal stomach. Colonic diverticulosis with contrast and multiple diverticula. Moderate amount of retained large bowel stool. The appendix is not discretely identified, however there are no inflammatory changes in the right lower quadrant.  KIDNEYS/ URINARY TRACT: Kidneys are orthotopic, demonstrating symmetric enhancement. No nephrolithiasis, hydronephrosis or solid renal masses. The unopacified ureters are normal in course and caliber. Delayed imaging through the kidneys demonstrates symmetric prompt contrast excretion within the proximal urinary collecting system. Urinary bladder is partially distended and unremarkable.  PERITONEUM/RETROPERITONEUM: No intraperitoneal free fluid nor free air. Aortoiliac vessels are normal in course and caliber, severe calcific atherosclerosis. No lymphadenopathy by CT size criteria. Sub cm necrotic  gastrohepatic lymph nodes again seen, similar ciliary access subcentimeter lymph nodes. Internal reproductive organs are unremarkable.  SOFT TISSUE/OSSEOUS STRUCTURES: Nonsuspicious.  IMPRESSION: Re- demonstration of distal esophageal mass, similar to slightly improved, corresponding to patient's known esophageal cancer with similar small gastrohepatic and celiac axis lymph nodes. Interval placement of gastrostomy tube, intraluminal retaining lobe.  Colonic diverticulosis without CT findings of acute diverticulitis. Moderate amount of retained large bowel stool.   Electronically Signed   By: Elon Alas   On: 08/17/2013 23:30   Dg Chest Portable 1 View  08/22/2013   CLINICAL DATA:  Chest pain and shortness of Breath.  EXAM: PORTABLE CHEST - 1 VIEW  COMPARISON:  08/17/2013.  FINDINGS: The power port is stable. The cardiac silhouette, mediastinal and hilar contours are normal and unchanged. The lungs are clear. No pleural effusion.  IMPRESSION: No acute cardiopulmonary findings.   Electronically Signed   By: Kalman Jewels M.D.   On: 08/22/2013 00:55    Microbiology: Recent Results (from the past 240 hour(s))  URINE CULTURE     Status: None   Collection Time    08/21/13 11:55 PM      Result Value Ref Range Status   Specimen Description URINE, RANDOM   Final   Special Requests NONE   Final   Culture  Setup Time     Final   Value: 08/22/2013 11:06     Performed at [facility]     Final   Value: NO GROWTH     Performed at [facility]   Culture     Final   Value: NO GROWTH     Performed at [facility]   Report Status 08/23/2013 FINAL   Final  CULTURE, BLOOD (ROUTINE X 2)     Status: None   Collection Time    08/22/13 12:04 AM      Result Value Ref Range Status   Specimen Description BLOOD RIGHT HAND  Final   Special Requests BOTTLES DRAWN AEROBIC AND ANAEROBIC [facility]   Final   Culture  Setup Time     Final   Value: 08/22/2013 05:04      Performed at [facility]   Culture     Final   Value: NO GROWTH 5 DAYS     Performed at [facility]   Report Status 08/28/2013 FINAL   Final  CULTURE, BLOOD (ROUTINE X 2)     Status: None   Collection Time    08/22/13 12:11 AM      Result Value Ref Range Status   Specimen Description BLOOD LEFT FOREARM   Final   Special Requests BOTTLES DRAWN AEROBIC AND ANAEROBIC 6CC   Final   Culture  Setup Time     Final   Value: 08/22/2013 05:04     Performed at [facility]   Culture     Final   Value: NO GROWTH 5 DAYS     Performed at [facility]   Report Status 08/28/2013 FINAL   Final    Labs: Basic Metabolic Panel:  Recent Labs Lab 08/22/13 0438 08/23/13 0405 08/25/13 0500 08/26/13 0355 08/27/13 0334  NA 143 141 135* 135* 137  K 4.2 3.5* 4.4 4.9 4.6  CL 107 107 99 101 101  CO2 27 23 25 25 26   GLUCOSE 117* 108* 108* 116* 101*  BUN 10 6 3* <3* 3*  CREATININE 0.88 0.82 0.80 0.84 0.94  CALCIUM 8.9 8.5 9.3 9.5 9.4   Liver Function Tests:  Recent Labs Lab 08/22/13 0011 08/23/13 0405  AST 35 24  ALT 32 19  ALKPHOS 78 56  BILITOT 0.5 0.4  PROT 7.9 5.7*  ALBUMIN 3.8 2.5*    Recent Labs Lab 08/22/13 0011  LIPASE 14   CBC:  Recent Labs Lab 08/22/13 0011 08/22/13 0438 08/23/13 0405  WBC 6.7 5.1 6.2  NEUTROABS 4.1  --   --   HGB 11.9* 9.7* 9.2*  HCT 35.1* 28.9* 27.6*  MCV 84.6 86.3 86.5  PLT 409* 337 356   Cardiac Enzymes:  Recent Labs Lab 08/22/13 0011 08/22/13 0438 08/22/13 1000 08/22/13 1530  TROPONINI <0.30 <0.30 <0.30 <0.30   Signed:  Marzetta Board  [name] 08/28/2013, 1:42 PM

## 2013-08-28 NOTE — Discharge Instructions (Signed)

## 2013-09-02 ENCOUNTER — Other Ambulatory Visit: Payer: Self-pay | Admitting: *Deleted

## 2013-09-16 ENCOUNTER — Encounter: Payer: Self-pay | Admitting: Radiation Oncology

## 2013-09-17 ENCOUNTER — Ambulatory Visit
Admission: RE | Admit: 2013-09-17 | Discharge: 2013-09-17 | Disposition: A | Discharge status: Home / Self Care | Payer: BC Managed Care – PPO | Source: Ambulatory Visit | Attending: Radiation Oncology | Admitting: Radiation Oncology

## 2013-09-19 ENCOUNTER — Other Ambulatory Visit: Payer: Self-pay | Admitting: Radiation Therapy

## 2013-09-19 DIAGNOSIS — C7949 Secondary malignant neoplasm of other parts of nervous system: Principal | ICD-10-CM

## 2013-09-19 DIAGNOSIS — C7931 Secondary malignant neoplasm of brain: Secondary | ICD-10-CM

## 2013-09-23 ENCOUNTER — Other Ambulatory Visit: Payer: Self-pay | Admitting: *Deleted

## 2013-10-23 ENCOUNTER — Telehealth: Payer: Self-pay | Admitting: *Deleted

## 2013-10-23 NOTE — Telephone Encounter (Signed)
Per Manuela Schwartz wanted me to fax medical records to [place] at [facility] , and fed ex treatment plans, done on 10-23-13

## 2013-11-14 ENCOUNTER — Other Ambulatory Visit: Payer: BC Managed Care – PPO

## 2013-12-04 ENCOUNTER — Other Ambulatory Visit: Payer: BC Managed Care – PPO

## 2013-12-08 ENCOUNTER — Ambulatory Visit: Payer: BC Managed Care – PPO | Admitting: Radiation Oncology

## 2013-12-09 ENCOUNTER — Ambulatory Visit
Admission: RE | Admit: 2013-12-09 | Discharge: 2013-12-09 | Disposition: A | Discharge status: Home / Self Care | Payer: BC Managed Care – PPO | Source: Ambulatory Visit | Attending: Radiation Oncology | Admitting: Radiation Oncology

## 2013-12-09 DIAGNOSIS — C7931 Secondary malignant neoplasm of brain: Secondary | ICD-10-CM

## 2013-12-09 DIAGNOSIS — C7949 Secondary malignant neoplasm of other parts of nervous system: Principal | ICD-10-CM

## 2013-12-09 MED ORDER — GADOBENATE DIMEGLUMINE 529 MG/ML IV SOLN
8.0000 mL | Freq: Once | INTRAVENOUS | Status: AC | PRN
  Administered 2013-12-09: 8 mL via INTRAVENOUS

## 2013-12-15 ENCOUNTER — Ambulatory Visit
Admission: RE | Admit: 2013-12-15 | Payer: BC Managed Care – PPO | Source: Ambulatory Visit | Admitting: Radiation Oncology

## 2013-12-17 ENCOUNTER — Ambulatory Visit
Admission: RE | Admit: 2013-12-17 | Discharge: 2013-12-17 | Disposition: A | Discharge status: Home / Self Care | Payer: BC Managed Care – PPO | Source: Ambulatory Visit | Attending: Radiation Oncology | Admitting: Radiation Oncology

## 2013-12-17 ENCOUNTER — Ambulatory Visit
Admission: RE | Admit: 2013-12-17 | Payer: BC Managed Care – PPO | Source: Ambulatory Visit | Admitting: Radiation Oncology

## 2013-12-17 ENCOUNTER — Ambulatory Visit: Admission: RE | Admit: 2013-12-17 | Payer: BC Managed Care – PPO | Source: Ambulatory Visit

## 2013-12-17 DIAGNOSIS — C7931 Secondary malignant neoplasm of brain: Secondary | ICD-10-CM

## 2013-12-17 NOTE — Progress Notes (Signed)
No show. Patient has enrolled on hospice. -----------------------------------  Eppie Gibson, MD

## 2014-03-03 DEATH — deceased

## 2014-04-03 ENCOUNTER — Encounter: Payer: Self-pay | Admitting: Radiation Therapy

## 2014-04-03 NOTE — Progress Notes (Signed)
Per obituary:   Devin Gonzalez, 85, died Feb 25, 2014.

## 2015-06-12 ENCOUNTER — Other Ambulatory Visit: Payer: Self-pay | Admitting: Nurse Practitioner

## 2015-08-07 IMAGING — CT CT ABD-PELV W/ CM
1 of 3 series · 13 of 32 positions shown, 18 images · IV contrast (OMNIPAQUE 300)
Comparison: CT of the chest, abdomen and pelvis May 19, 2013

CLINICAL DATA: Nausea, vomiting. History of esophageal cancer and
gastroesophageal reflux disease.

EXAM:
CT ABDOMEN AND PELVIS WITH CONTRAST
TECHNIQUE: Multidetector CT imaging of the abdomen and pelvis was performed
using the standard protocol following bolus administration of
intravenous contrast.
CONTRAST:  50mL OMNIPAQUE IOHEXOL 300 MG/ML SOLN, 100mL OMNIPAQUE
IOHEXOL 300 MG/ML SOLN

[Series 2: abd/pel with · axial · 0.74mm/px · z∈[+1020,+1350]mm · 13 of 76 slices shown, 18 images]
[im 5/76  soft-tissue]
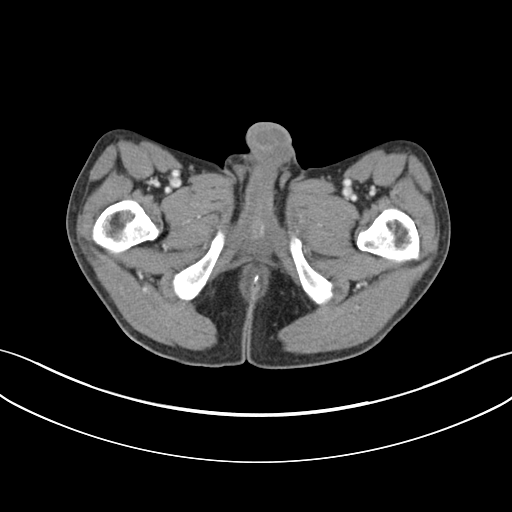
[im 5/76  bone]
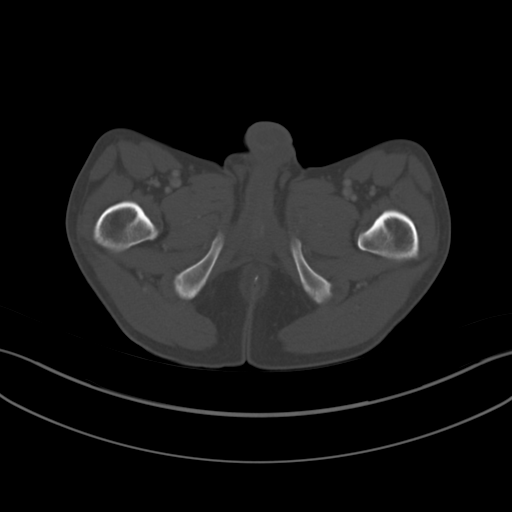
[im 13/76  soft-tissue]
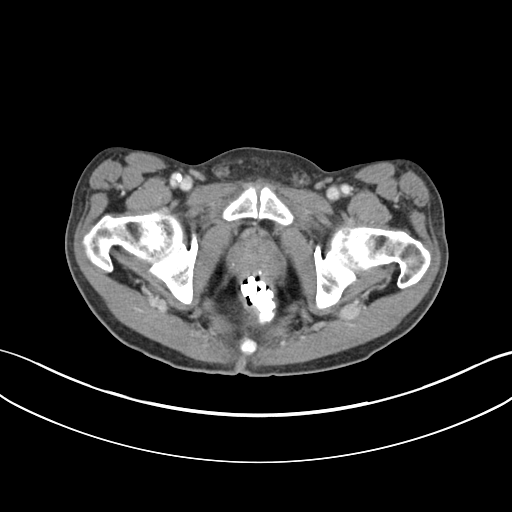
[im 17/76  soft-tissue]
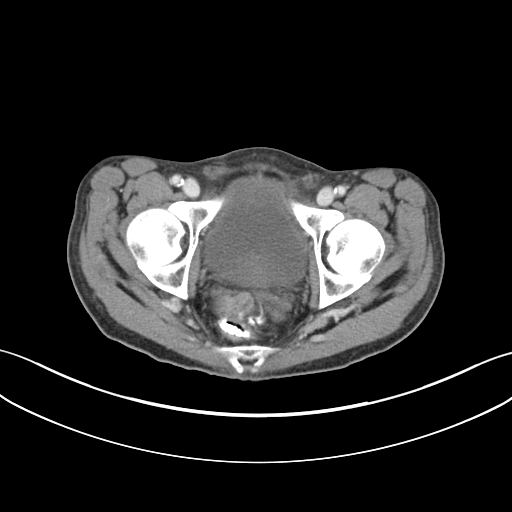
[im 21/76  soft-tissue]
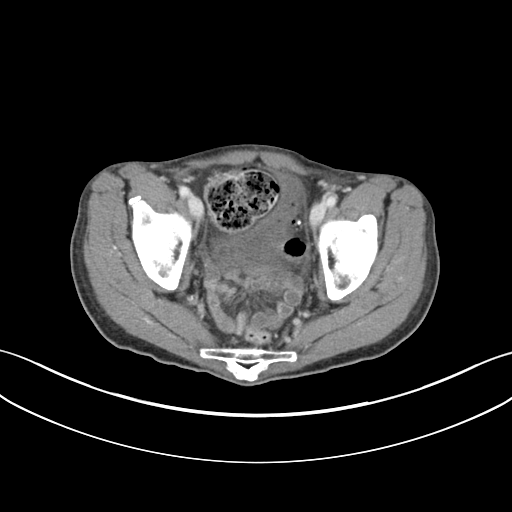
[im 30/76  soft-tissue]
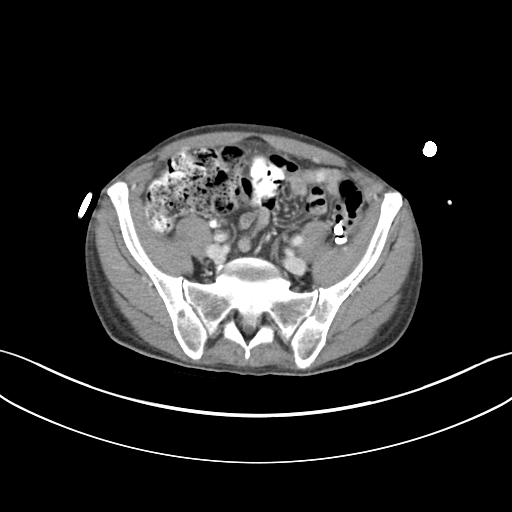
[im 34/76  soft-tissue]
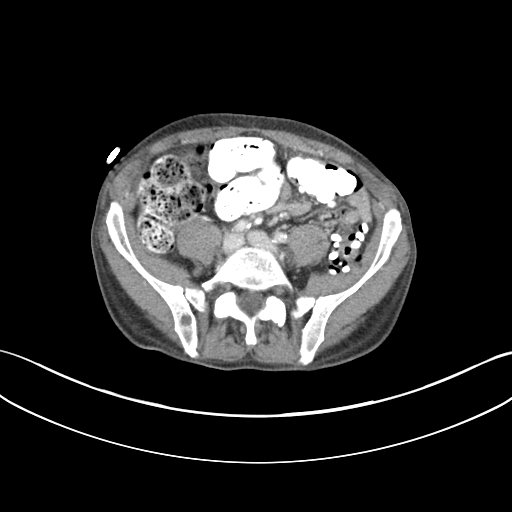
[im 42/76  soft-tissue]
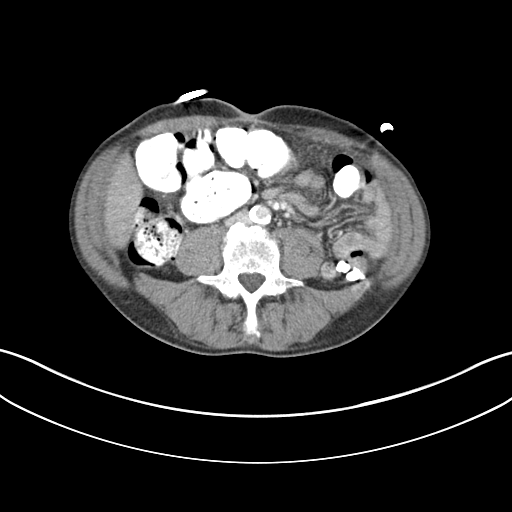
[im 46/76  soft-tissue]
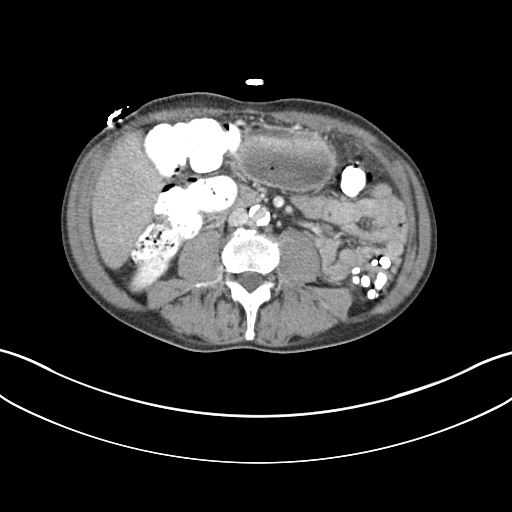
[im 55/76  soft-tissue]
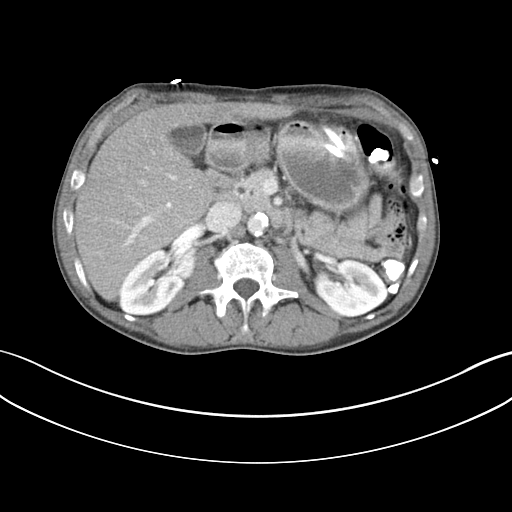
[im 55/76  bone]
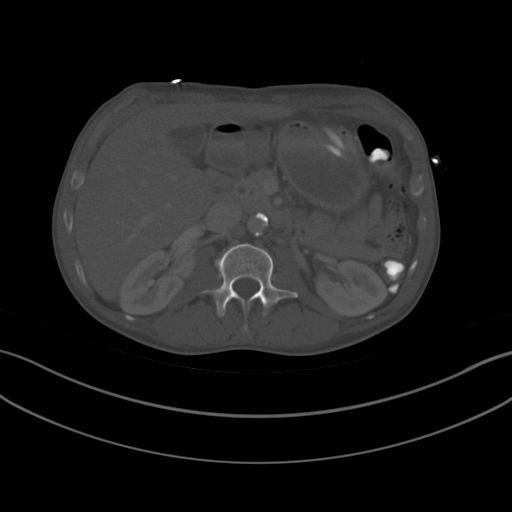
[im 59/76  soft-tissue]
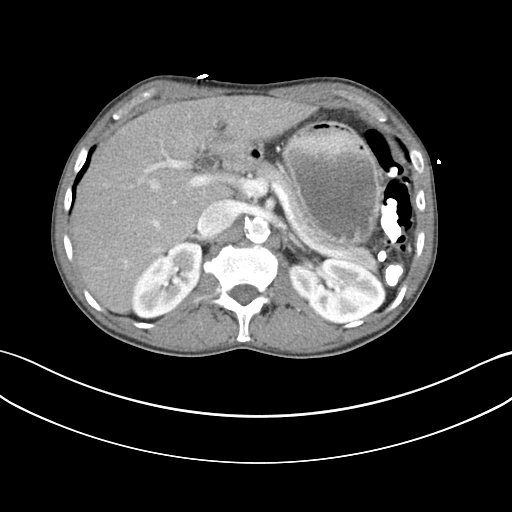
[im 59/76  lung]
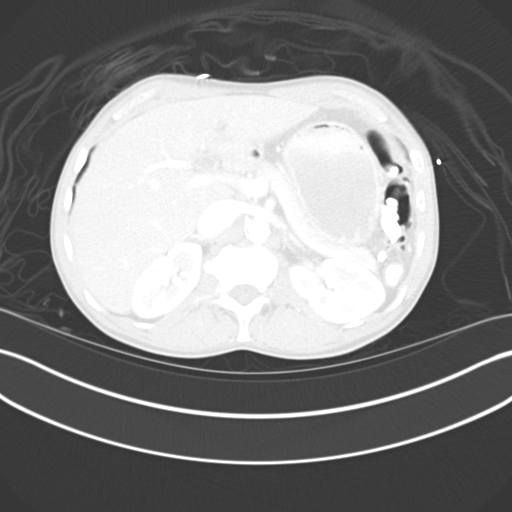
[im 63/76  soft-tissue]
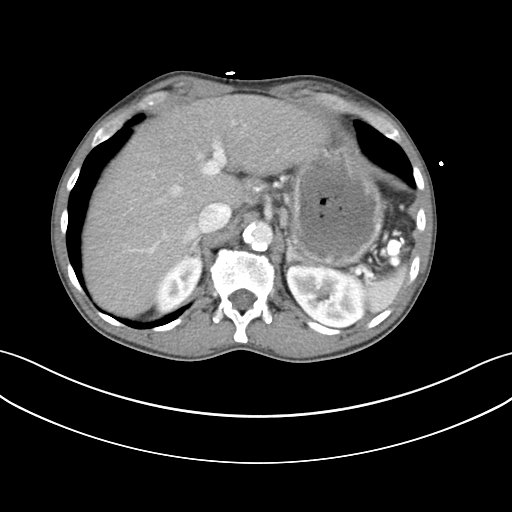
[im 63/76  lung]
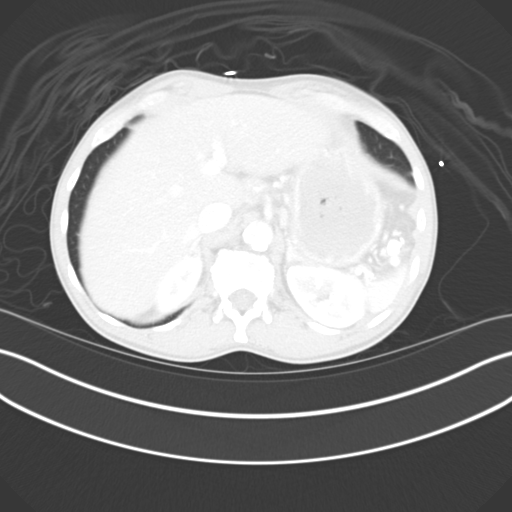
[im 67/76  lung]
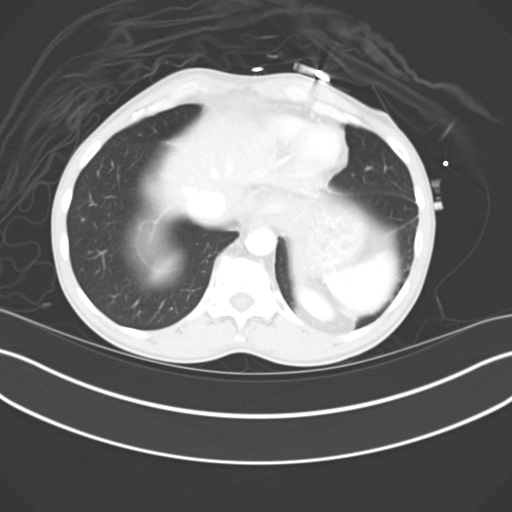
[im 71/76  soft-tissue]
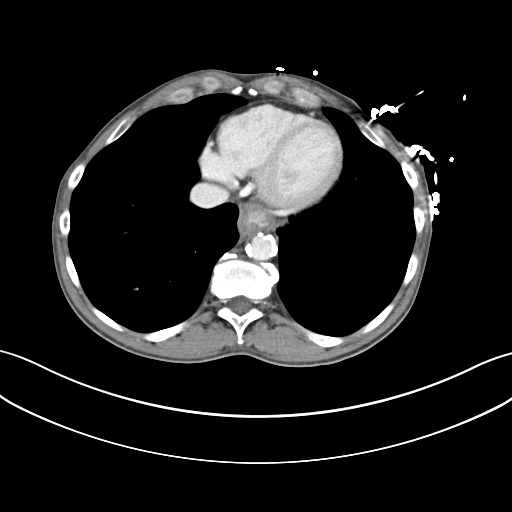
[im 71/76  lung]
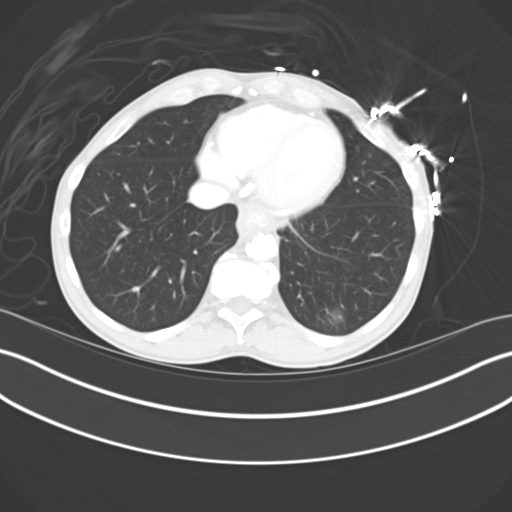

[13 of 32 positions shown; findings below may reference images not displayed]

FINDINGS: LUNG BASES: Included view of the lung bases are clear. Visualized
heart and pericardium are unremarkable.

SOLID ORGANS: The liver, spleen, gallbladder, pancreas and adrenal
glands are unremarkable.

GASTROINTESTINAL TRACT: Again noted is thickened irregular is distal
esophagus corresponding to patient's known neoplasm. The stomach,
small and large bowel are normal in course and caliber without
inflammatory changes. Gastrostomy tube in place, retaining balloon
within the proximal stomach. Colonic diverticulosis with contrast
and multiple diverticula. Moderate amount of retained large bowel
stool. The appendix is not discretely identified, however there are
no inflammatory changes in the right lower quadrant.

KIDNEYS/ URINARY TRACT: Kidneys are orthotopic, demonstrating
symmetric enhancement. No nephrolithiasis, hydronephrosis or solid
renal masses. The unopacified ureters are normal in course and
caliber. Delayed imaging through the kidneys demonstrates symmetric
prompt contrast excretion within the proximal urinary collecting
system. Urinary bladder is partially distended and unremarkable.

PERITONEUM/RETROPERITONEUM: No intraperitoneal free fluid nor free
air. Aortoiliac vessels are normal in course and caliber, severe
calcific atherosclerosis. No lymphadenopathy by CT size criteria.
Sub cm necrotic gastrohepatic lymph nodes again seen, similar
ciliary access subcentimeter lymph nodes. Internal reproductive
organs are unremarkable.

SOFT TISSUE/OSSEOUS STRUCTURES: Nonsuspicious.
IMPRESSION: Re- demonstration of distal esophageal mass, similar to slightly
improved, corresponding to patient's known esophageal cancer with
similar small gastrohepatic and celiac axis lymph nodes. Interval
placement of gastrostomy tube, intraluminal retaining lobe.

Colonic diverticulosis without CT findings of acute diverticulitis.
Moderate amount of retained large bowel stool.

  By: Mickis Cirik

## 2015-08-12 IMAGING — DX DG ABDOMEN 1V
1 series · 1 of 1 positions shown · non-contrast
Comparison: Abdominal CT 08/17/2013

CLINICAL DATA: Chest pain and abdominal pain.

EXAM:
ABDOMEN - 1 VIEW

[abdomen kub]
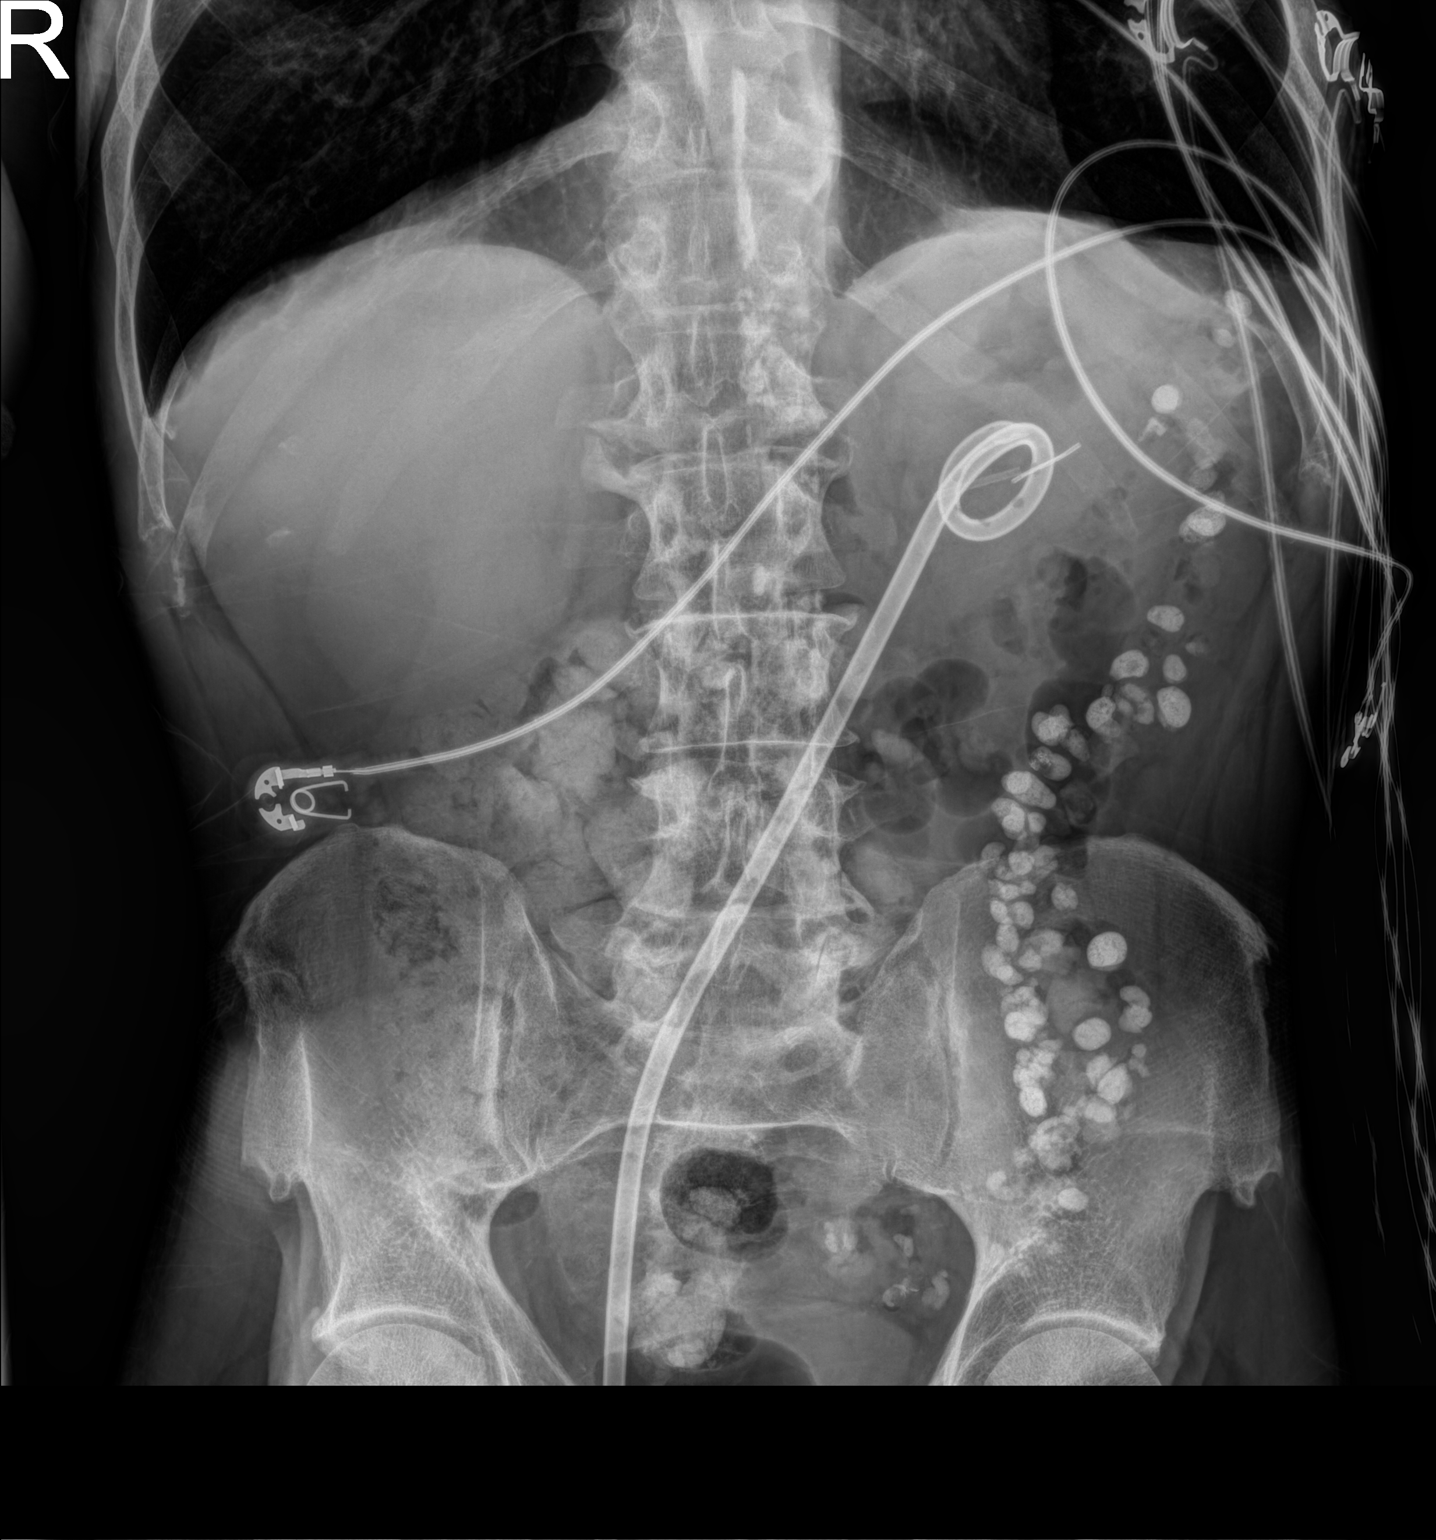

[1 of 1 positions shown; findings below may reference images not displayed]

FINDINGS: A pigtail catheter retention loop overlaps the stomach. T tacks
likely present at this location.

Prominent volume of stool in the proximal colon. Numerous distal
colonic diverticula with retained barium. No concerning
intra-abdominal mass effect or calcification. There is extensive
aortic atherosclerotic disease. Lung bases are clear.
IMPRESSION: 1. Possible constipation
2. Numerous colonic diverticula.

## 2022-11-22 NOTE — Telephone Encounter (Signed)
Telephone call
